# Patient Record
Sex: Female | Born: 1950
Health system: Southern US, Community
[De-identification: ages and names within clinical notes are randomized; demographics above are authoritative.]

## PROBLEM LIST (undated history)

## (undated) DIAGNOSIS — G4733 Obstructive sleep apnea (adult) (pediatric): Secondary | ICD-10-CM

## (undated) DIAGNOSIS — R0600 Dyspnea, unspecified: Secondary | ICD-10-CM

## (undated) DIAGNOSIS — M797 Fibromyalgia: Secondary | ICD-10-CM

## (undated) DIAGNOSIS — G43909 Migraine, unspecified, not intractable, without status migrainosus: Secondary | ICD-10-CM

## (undated) DIAGNOSIS — R42 Dizziness and giddiness: Secondary | ICD-10-CM

## (undated) DIAGNOSIS — M1990 Unspecified osteoarthritis, unspecified site: Secondary | ICD-10-CM

## (undated) DIAGNOSIS — IMO0001 Reserved for inherently not codable concepts without codable children: Secondary | ICD-10-CM

## (undated) DIAGNOSIS — N6320 Unspecified lump in the left breast, unspecified quadrant: Secondary | ICD-10-CM

## (undated) HISTORY — DX: Migraine, unspecified, not intractable, without status migrainosus: G43.909

## (undated) HISTORY — DX: Dizziness and giddiness: R42

## (undated) HISTORY — DX: Dyspnea, unspecified: R06.00

## (undated) HISTORY — PX: WISDOM TOOTH EXTRACTION: SHX21

## (undated) HISTORY — PX: BLEPHAROPLASTY: SUR158

## (undated) HISTORY — PX: BREAST BIOPSY: SHX20

## (undated) HISTORY — DX: Reserved for inherently not codable concepts without codable children: IMO0001

## (undated) HISTORY — DX: Obstructive sleep apnea (adult) (pediatric): G47.33

## (undated) HISTORY — PX: CATARACT EXTRACTION, BILATERAL: SHX1313

## (undated) HISTORY — DX: Unspecified lump in the left breast, unspecified quadrant: N63.20

## (undated) HISTORY — PX: BUNIONECTOMY: SHX129

---

## 1997-12-24 ENCOUNTER — Other Ambulatory Visit: Admission: RE | Admit: 1997-12-24 | Discharge: 1997-12-24 | Payer: Self-pay | Admitting: Obstetrics and Gynecology

## 1999-01-26 ENCOUNTER — Other Ambulatory Visit: Admission: RE | Admit: 1999-01-26 | Discharge: 1999-01-26 | Payer: Self-pay | Admitting: Obstetrics and Gynecology

## 2000-01-06 ENCOUNTER — Encounter: Admission: RE | Admit: 2000-01-06 | Discharge: 2000-01-06 | Payer: Self-pay | Admitting: Internal Medicine

## 2000-01-06 ENCOUNTER — Other Ambulatory Visit: Admission: RE | Admit: 2000-01-06 | Discharge: 2000-01-06 | Payer: Self-pay | Admitting: Obstetrics and Gynecology

## 2000-01-06 ENCOUNTER — Encounter: Payer: Self-pay | Admitting: Internal Medicine

## 2000-02-08 ENCOUNTER — Other Ambulatory Visit: Admission: RE | Admit: 2000-02-08 | Discharge: 2000-02-08 | Payer: Self-pay | Admitting: Obstetrics and Gynecology

## 2000-02-09 ENCOUNTER — Encounter (INDEPENDENT_AMBULATORY_CARE_PROVIDER_SITE_OTHER): Payer: Self-pay

## 2000-02-09 ENCOUNTER — Other Ambulatory Visit: Admission: RE | Admit: 2000-02-09 | Discharge: 2000-02-09 | Payer: Self-pay | Admitting: Obstetrics and Gynecology

## 2000-12-04 ENCOUNTER — Other Ambulatory Visit: Admission: RE | Admit: 2000-12-04 | Discharge: 2000-12-04 | Payer: Self-pay | Admitting: Obstetrics and Gynecology

## 2000-12-18 ENCOUNTER — Encounter: Payer: Self-pay | Admitting: Obstetrics and Gynecology

## 2000-12-18 ENCOUNTER — Encounter: Admission: RE | Admit: 2000-12-18 | Discharge: 2000-12-18 | Payer: Self-pay | Admitting: Obstetrics and Gynecology

## 2001-01-10 ENCOUNTER — Encounter: Payer: Self-pay | Admitting: Internal Medicine

## 2001-01-10 ENCOUNTER — Encounter: Admission: RE | Admit: 2001-01-10 | Discharge: 2001-01-10 | Payer: Self-pay | Admitting: Internal Medicine

## 2002-07-29 ENCOUNTER — Other Ambulatory Visit: Admission: RE | Admit: 2002-07-29 | Discharge: 2002-07-29 | Payer: Self-pay | Admitting: *Deleted

## 2005-07-07 ENCOUNTER — Other Ambulatory Visit: Admission: RE | Admit: 2005-07-07 | Discharge: 2005-07-07 | Payer: Self-pay | Admitting: Internal Medicine

## 2005-07-28 ENCOUNTER — Encounter: Admission: RE | Admit: 2005-07-28 | Discharge: 2005-07-28 | Payer: Self-pay | Admitting: Internal Medicine

## 2006-08-14 ENCOUNTER — Ambulatory Visit: Payer: Self-pay | Admitting: Internal Medicine

## 2006-08-15 ENCOUNTER — Ambulatory Visit (HOSPITAL_BASED_OUTPATIENT_CLINIC_OR_DEPARTMENT_OTHER): Admission: RE | Admit: 2006-08-15 | Discharge: 2006-08-15 | Payer: Self-pay | Admitting: Internal Medicine

## 2006-08-15 ENCOUNTER — Encounter: Payer: Self-pay | Admitting: Internal Medicine

## 2006-08-26 ENCOUNTER — Ambulatory Visit: Payer: Self-pay | Admitting: Internal Medicine

## 2006-09-04 ENCOUNTER — Ambulatory Visit: Payer: Self-pay | Admitting: Internal Medicine

## 2006-09-05 ENCOUNTER — Ambulatory Visit (HOSPITAL_BASED_OUTPATIENT_CLINIC_OR_DEPARTMENT_OTHER): Admission: RE | Admit: 2006-09-05 | Discharge: 2006-09-05 | Payer: Self-pay | Admitting: Internal Medicine

## 2006-09-05 ENCOUNTER — Encounter: Payer: Self-pay | Admitting: Internal Medicine

## 2006-09-12 ENCOUNTER — Ambulatory Visit: Admission: RE | Admit: 2006-09-12 | Discharge: 2006-09-12 | Payer: Self-pay | Admitting: Internal Medicine

## 2006-09-24 ENCOUNTER — Ambulatory Visit: Payer: Self-pay | Admitting: Internal Medicine

## 2006-10-26 ENCOUNTER — Ambulatory Visit: Payer: Self-pay | Admitting: Internal Medicine

## 2006-11-29 ENCOUNTER — Other Ambulatory Visit: Admission: RE | Admit: 2006-11-29 | Discharge: 2006-11-29 | Payer: Self-pay | Admitting: Internal Medicine

## 2006-12-04 ENCOUNTER — Ambulatory Visit: Payer: Self-pay | Admitting: Internal Medicine

## 2006-12-17 ENCOUNTER — Ambulatory Visit: Payer: Self-pay | Admitting: Internal Medicine

## 2006-12-19 ENCOUNTER — Encounter: Admission: RE | Admit: 2006-12-19 | Discharge: 2006-12-19 | Payer: Self-pay | Admitting: Internal Medicine

## 2007-01-31 ENCOUNTER — Ambulatory Visit (HOSPITAL_COMMUNITY): Admission: RE | Admit: 2007-01-31 | Discharge: 2007-01-31 | Payer: Self-pay | Admitting: Anesthesiology

## 2007-02-25 ENCOUNTER — Ambulatory Visit: Payer: Self-pay | Admitting: Internal Medicine

## 2007-06-27 ENCOUNTER — Ambulatory Visit: Payer: Self-pay | Admitting: Internal Medicine

## 2008-03-06 ENCOUNTER — Encounter: Admission: RE | Admit: 2008-03-06 | Discharge: 2008-03-06 | Payer: Self-pay | Admitting: Internal Medicine

## 2008-03-26 ENCOUNTER — Other Ambulatory Visit: Admission: RE | Admit: 2008-03-26 | Discharge: 2008-03-26 | Payer: Self-pay | Admitting: Internal Medicine

## 2008-06-25 ENCOUNTER — Ambulatory Visit: Payer: Self-pay | Admitting: Internal Medicine

## 2008-06-25 DIAGNOSIS — G43909 Migraine, unspecified, not intractable, without status migrainosus: Secondary | ICD-10-CM | POA: Insufficient documentation

## 2008-06-25 DIAGNOSIS — G4733 Obstructive sleep apnea (adult) (pediatric): Secondary | ICD-10-CM | POA: Insufficient documentation

## 2008-06-25 DIAGNOSIS — M797 Fibromyalgia: Secondary | ICD-10-CM | POA: Insufficient documentation

## 2008-06-25 DIAGNOSIS — R0602 Shortness of breath: Secondary | ICD-10-CM | POA: Insufficient documentation

## 2009-04-06 ENCOUNTER — Other Ambulatory Visit: Admission: RE | Admit: 2009-04-06 | Discharge: 2009-04-06 | Payer: Self-pay | Admitting: Internal Medicine

## 2009-04-06 ENCOUNTER — Ambulatory Visit: Payer: Self-pay | Admitting: Internal Medicine

## 2009-04-06 LAB — HM PAP SMEAR

## 2009-06-02 ENCOUNTER — Encounter: Admission: RE | Admit: 2009-06-02 | Discharge: 2009-06-02 | Payer: Self-pay | Admitting: Internal Medicine

## 2009-06-24 ENCOUNTER — Ambulatory Visit: Payer: Self-pay | Admitting: Internal Medicine

## 2009-09-12 ENCOUNTER — Encounter: Payer: Self-pay | Admitting: Internal Medicine

## 2010-04-17 ENCOUNTER — Encounter: Payer: Self-pay | Admitting: Internal Medicine

## 2010-06-07 ENCOUNTER — Ambulatory Visit: Payer: Self-pay | Admitting: Internal Medicine

## 2010-06-08 ENCOUNTER — Encounter: Admission: RE | Admit: 2010-06-08 | Discharge: 2010-06-08 | Payer: Self-pay | Admitting: Internal Medicine

## 2010-06-08 LAB — HM MAMMOGRAPHY

## 2010-06-23 ENCOUNTER — Ambulatory Visit: Payer: Self-pay | Admitting: Internal Medicine

## 2010-06-23 DIAGNOSIS — G4700 Insomnia, unspecified: Secondary | ICD-10-CM | POA: Insufficient documentation

## 2010-09-29 NOTE — Assessment & Plan Note (Signed)
Summary: rov 1 yr ///kp   Primary Makyla Bye/Referring Haidy Kackley:  Eden Emms Baxley  CC:  Yearly follow up visit-sleep; not sleeping as well-goes to bed around 11pm and wakes up around 4am..  History of Present Illness:  06/25/08- CPAP has given big relief from fibromyalgia pain. Also on methadone from Dr Corliss Skains. Some EDS. Takes a nap each afternoon, 45- 60 min. HS 11PM, Up 7-8 AM. Rare Sudafed. Occ palpitation.  June 24, 2009- OSA, hx fibromyalgia Still notes morning tiredness and asks about a trial of higher pressure. CPAP 9 remains comfortable. She doesn't think she is snoring through or particularly restless and she continues to use cpap every night.  Current mask is full-face. Still feels need to rest/ nap in afternoon. Trazodone at HS. Very restless night if omitted. Still on methadone and trazodone for Migraine- Dr Thyra Breed, with dose reduced out of concern of some mild mental lapses. 2 cups of coffee in AM. Needs 3 hours to get going in AM. HS 06-1129, Up 7-730AM.  June 23, 2010- OSA, hx fibromyalgia Nurse-CC: Yearly follow up visit-sleep; not sleeping as well-goes to bed around 11pm and wakes up around 4am. She isn't sleeping as well- bedtime 11PM, sleeps soundly, but now waking around 4AM x 2 months. Fibromyalgia ache and tenderness varies, but worse than 6 months ago. Takes methadone in AM, Trazodone 2x50 mg at bedtime. CPAP 10 is comfortably worn all night every night. Rarely has fallen asleep without it and can tell she didn't sleep as well.      Preventive Screening-Counseling & Management  Alcohol-Tobacco     Smoking Status: quit     Year Quit: 1979     Pack years: 74yrs  Current Medications (verified): 1)  Multivitamins  Tabs (Multiple Vitamin) .... Take 1 Tablet By Mouth Once A Day 2)  Calcium 500/vitamin D 500-125 Mg-Unit Tabs (Calcium Carbonate-Vitamin D) .... Take 2 Tablet By Mouth Once A Day 3)  Methadone Hcl 5 Mg Tabs (Methadone Hcl) .... Take One  Tablet By Mouth Every 8 Hours As Needed 4)  Trazodone Hcl 50 Mg Tabs (Trazodone Hcl) .... Take 1 To 2 Tablet By Mouth At Bedtime 5)  Relpax 40 Mg Tabs (Eletriptan Hydrobromide) .... Take 1 Tablet By Mouth Once A Day As Needed 6)  Cpap 10 .... [facility] 7)  Vitamin D 2000 Unit Tabs (Cholecalciferol) .... Take 1 By Mouth Once Daily 8)  Glucosamine Complex  Tabs (Nutritional Supplements) .Marland Kitchen.. 1 Once Daily 9)  Folic Acid 400 Mcg Tabs (Folic Acid) .Marland Kitchen.. 1 Once Daily 10)  Aleve 220 Mg Tabs (Naproxen Sodium) .... Take As Directed As Needed 11)  Fish Oil 1200 Mg Caps (Omega-3 Fatty Acids) .... Take 1 By Mouth Once Daily  Allergies (verified): 1)  ! Depakote  Past History:  Past Medical History: Last updated: 06/25/2008 DYSPNEA (ICD-786.05) MIGRAINE HEADACHE (ICD-346.90) FIBROMYALGIA (ICD-729.1) OBSTRUCTIVE SLEEP APNEA (ICD-327.23)    Past Surgical History: Last updated: 06/24/2009 Bunion x 2 blepharoplasty breast bx B9  Family History: Last updated: 06/25/2008 mother living - hypertension, arthritis, degeneration, hypercholesterolemia, hypothyroidism, melonoma  father died on multiple myeloma  sibing 1 has arthritis  sibling 2 has sarcoidosis, NIDDM  Social History: Last updated: 06/25/2008 former smoker states walks 3x per week drinks 2 cups caffeine a day drinks wine 1-2x month married for 23yrs 2 children  Risk Factors: Smoking Status: quit (06/23/2010)  Review of Systems      See HPI  The patient denies shortness of breath with  activity, shortness of breath at rest, productive cough, non-productive cough, coughing up blood, chest pain, irregular heartbeats, acid heartburn, indigestion, loss of appetite, weight change, abdominal pain, difficulty swallowing, sore throat, tooth/dental problems, headaches, nasal congestion/difficulty breathing through nose, and sneezing.    Vital Signs:  Patient profile:   60 year old female Height:      66  inches Weight:      181.25 pounds BMI:     29.36 O2 Sat:      95 % on Room air Pulse rate:   71 / minute BP sitting:   118 / 64  (left arm) Cuff size:   regular  Vitals Entered By: Reynaldo Minium CMA (June 23, 2010 1:31 PM)  O2 Flow:  Room air CC: Yearly follow up visit-sleep; not sleeping as well-goes to bed around 11pm and wakes up around 4am.   Physical Exam  Additional Exam:  General: A/Ox3; pleasant and cooperative, NAD, SKIN: no rash, lesions NODES: no lymphadenopathy HEENT: [place]/AT, EOM- WNL, Conjuctivae- clear, PERRLA, TM-WNL, Nose- clear, Throat- clear and wnl, Mallampati  II-III NECK: Supple w/ fair ROM, JVD- none, normal carotid impulses w/o bruits Thyroid- normal to palpation CHEST: Clear to P&A HEART: RRR, no m/g/r heard ABDOMEN: Soft and nl EAV:WUJW, nl pulses, no edema  NEURO: Grossly intact to observation      Impression & Recommendations:  Problem # 1:  OBSTRUCTIVE SLEEP APNEA (ICD-327.23)  Good compliance and control now at 10. No changes needed.  Problem # 2:  INSOMNIA (ICD-780.52)  Early waking insomnia most likely related to the same issues as her fibromyalgia. We discussed sleep hygiene and available management.  since she is already used to trazodone, I will let her try increasing that to 150 mg. If that isn't helpfule, then try a short-half life med on early waking as needed.   Her updated medication list for this problem includes:    Zaleplon 10 Mg Caps (Zaleplon) .Marland Kitchen... 1 for sleep if needed  Medications Added to Medication List This Visit: 1)  Calcium 500/vitamin D 500-125 Mg-unit Tabs (Calcium carbonate-vitamin d) .... Take 2 tablet by mouth once a day 2)  Methadone Hcl 5 Mg Tabs (Methadone hcl) .... Take one tablet by mouth every 8 hours as needed 3)  Trazodone Hcl 50 Mg Tabs (Trazodone hcl) .... Take 1 to 3  tablets by mouth at bedtime 4)  Vitamin D 2000 Unit Tabs (Cholecalciferol) .... Take 1 by mouth once daily 5)  Aleve 220 Mg Tabs  (Naproxen sodium) .... Take as directed as needed 6)  Fish Oil 1200 Mg Caps (Omega-3 fatty acids) .... Take 1 by mouth once daily 7)  Zaleplon 10 Mg Caps (Zaleplon) .Marland Kitchen.. 1 for sleep if needed  Other Orders: Est. Patient Level IV (11914)  Patient Instructions: 1)  Please schedule a follow-up appointment in 1 year. 2)  Make sure your bedroom is comfortable and that it doesn't get too warm during the night- a common cause of early waking. 3)  Try increasing Trazodone to 150 mg at bedtime. 4)  If that doesn't work or you don't like it, then go back to the 100 mg dose and try adding generic Sonata for early waking.  Prescriptions: TRAZODONE HCL 50 MG TABS (TRAZODONE HCL) take 1 to 3  tablets by mouth at bedtime  #90 x 5   Entered and Authorized by:   Waymon Budge MD   Signed by:   Waymon Budge MD on 06/23/2010   Method used:  Print then Give to Patient   RxID:   [id number] ZALEPLON 10 MG CAPS (ZALEPLON) 1 for sleep if needed  #30 x 5   Entered and Authorized by:   Waymon Budge MD   Signed by:   Waymon Budge MD on 06/23/2010   Method used:   Print then Give to Patient   RxID:   [id number]    Immunization History:  Influenza Immunization History:    Influenza:  historical (06/07/2010)

## 2010-09-29 NOTE — Letter (Signed)
Summary: CMN for PAP/[facility]  CMN for PAP/[facility]   Imported By: Sherian Rein 09/15/2009 12:08:10  _____________________________________________________________________  External Attachment:    Type:   Image     Comment:   External Document

## 2010-09-29 NOTE — Letter (Signed)
Summary: CMN/[facility]  CMN/[facility]   Imported By: Sherian Rein 04/25/2010 08:02:30  _____________________________________________________________________  External Attachment:    Type:   Image     Comment:   External Document

## 2011-01-10 NOTE — Assessment & Plan Note (Signed)
[facility]                             PULMONARY OFFICE NOTE   CHEZNEY, HUETHER                       MRN:          [id number]  DATE:06/27/2007                            DOB:          September 04, 1950    PROBLEMS:  1. Obstructive sleep apnea.  2. Fibromyalgia/migraine.  3. Dyspnea.   HISTORY:  She is extremely pleased with the way she is doing now. She is  quite comfortable with CPAP at 9 CWP and says that she can tell the  difference next day if she misses wearing it. Dr. Vear Clock added  Trazodone, which has also been a big help with sleep quality and the  combination seems to be doing nicely. She tried Provigil samples, but it  gave headache and she does not really need it. She is comfortable with  more sense of energy and overall improved quality of life.   MEDICATIONS:  1. Magnesium.  2. Calcium with vitamin D.  3. CPAP 9 CWP.  4. Methadone 5 mg every 6 hours.  5. Trazodone 75 mg nightly.  6. Trova p.r.n. migraine.  7. Relpax 40 mg.  8. Benadryl 25 mg p.r.n.   Drug intolerant DEPAKOTE.   OBJECTIVE:  Weight 209 pounds, blood pressure 110/74, pulse 78, room air  saturation 98%. She is obese, pleasant, and alert. There are no pressure  marks on her face from CPAP. No evident nasal obstruction. Breathing is  unlabored. Pulse is regular without murmur. No strider. No thyromegaly.  No tremor.   IMPRESSION:  Obstructive sleep apnea doing well with CPAP at 9 CWP.  Weight loss would help as reviewed.   PLAN:  Continue present treatment program; aim for weight loss as able.  Schedule return 1 year, earlier p.r.n.     [name], MD, [name], FACP  Electronically Signed    CDY/MedQ  DD: 06/27/2007  DT: 06/28/2007  Job #: [id number]   cc:   Kathryne Hitch, MD  Luanna Cole. Lenord Fellers, M.D.  Kathrin Penner. Vear Clock, M.D.

## 2011-01-10 NOTE — Assessment & Plan Note (Signed)
[facility]                             PULMONARY OFFICE NOTE   Kari Flynn, Kari Flynn                       MRN:          [id number]  DATE:02/25/2007                            DOB:          [birth year]    PROBLEMS:  1. Obstructive sleep apnea.  2. Fibromyalgia/migraine (Dr. Thyra Breed).  3. Dyspnea.   HISTORY:  Treating her sleep apnea continues to seem to help her  headache complaint. Dr. Vear Clock has put her on Methadone and she says  that is the best drug for her fatigue and headache pain. She still  complains of no energy, listless, and has to lay down once or twice a  day to rest. She continues CPAP at 9 CWP without much feedback from her  husband, so she is not sure if she is snoring through it or not. We  reviewed, again, the principles of good sleep hygiene, and expectations  related to CPAP.   MEDICATIONS:  1. Vitamins.  2. Magnesium.  3. Calcium.  4. Methadone 5 mg every 6 hours.  5. Desipramine 25 mg x2.  6. CPAP at 9 CWP.  7. Relpax.  8. Benadryl p.r.n.   Drug intolerant DEPAKOTE.   OBJECTIVE:  Weight 212 pounds, blood pressure 144/94, pulse 91, room air  saturation 97%. She seems alert and comfortable. Overweight. Nose and  throat are clear. Breathing is unlabored. Chest is clear. Heart sounds  are regular without murmur.   IMPRESSION:  Obstructive sleep apnea, fibromyalgia, and complaints of  headache and daytime fatigue. Dyspnea is apparently not significant and  not very well defined as a complaint. I am not sure if she is not just  discussing deconditioning.   PLAN:  1. She is going to ask her husband to pay more attention to her      breathing while asleep with CPAP.  2. Try Provigil sample 200 mg q.a.m.  3. Consider increasing CPAP pressure return or p.r.n.  4. Schedule return 4 months, but earlier p.r.n.     [name], MD, [name], FACP  Electronically Signed    CDY/MedQ  DD: 03/03/2007  DT: 03/04/2007   Job #: [id number]   cc:   Pollyann Savoy, M.D.  Luanna Cole. Lenord Fellers, M.D.

## 2011-01-13 NOTE — Procedures (Signed)
NAMEHARMONEY, SIENKIEWICZ                ACCOUNT NO.:  [id number]   MEDICAL RECORD NO.:  [id number]          PATIENT TYPE:  OUT   LOCATION:  [facility]                 FACILITY:  [facility]   PHYSICIAN:  [name], MD, FCCP, FACPDATE OF BIRTH:  04-21-1949   DATE OF STUDY:  09/05/2006                            NOCTURNAL POLYSOMNOGRAM   REFERRING PHYSICIAN:  [name], MD.   DATE OF STUDY:  September 05, 2006.   INDICATION FOR STUDY:  Hypersomnia with sleep apnea.   EPWORTH SLEEPINESS SCORE:  14/24.   BMI:  1. Weight 209 pounds.   MEDICATIONS:  Was reviewed.   CPAP titration is requested.  A baseline diagnostic NPSG on August 15, 2006 had recorded an AHI of 27.7 per hour.   SLEEP ARCHITECTURE:  Total sleep time 270 minutes with sleep efficiency  71%.  Stage I was 4%, stage II 87%, stages III and IV 10%, REM absent.  Sleep latency 70 minutes, awake after asleep onset 40 minutes.  Arousal  index 14.  No bedtime medication was reported.   RESPIRATORY DATA:  CPAP titration protocol:  CPAP was titrated to 9 CWP,  AHI 11.5.  Higher pressures were associated with higher breakthrough  scores apparently reflecting nasal congestion.  A medium Respironics  comfort II full face mask was used with a heated humidifier.   OXYGEN DATA:  Oxygen saturation held at 97% on room air while on CPAP.   CARDIAC DATA:  Normal sinus rhythm with occasional PVCs.   MOVEMENT-PARASOMNIA:  Rare limb jerks.   IMPRESSIONS-RECOMMENDATIONS:  1. CPAP titration to a best pressure of 9 CWP, apnea/hypopnea index      11.5 per hour.  Higher pressures were associated with increased      breakthrough events reflecting nasal congestion.  A medium      Respironics comfort II full face mask was used with a heated      humidifier.  2. Baseline nocturnal polysomnogram on August 15, 2006 had recorded      an apnea/hypopnea index of 27.7 per hour.  3. The patient commented that her usual morning headache was  not      present suggesting that CPAP had provided relief.      [name], MD, [facility], FACP  Diplomate, Biomedical engineer of Sleep Medicine  Electronically Signed     CDY/MEDQ  D:  09/09/2006 12:45:04  T:  09/09/2006 23:35:05  Job:  [id number]

## 2011-01-13 NOTE — Assessment & Plan Note (Signed)
[facility]                             PULMONARY OFFICE NOTE   ORINE, GOGA                       MRN:          [id number]  DATE:10/26/2006                            DOB:          [birth year]    PULMONARY FOLLOWUP   PROBLEMS:  1. Obstructive sleep apnea.  2. Fibromyalgia/migraine.  3. Dyspnea.   HISTORY:  She is extremely pleased with her experience on CPAP at 9 CWP,  saying she wishes she had had it 10 years ago.  She has had no  headaches.  Has been able to wean off of Cymbalta and methadone.  Temazepam did not seem to have helped sleep quality much.  Benadryl 25  mg at bedtime is satisfactory when needed.  She thought the combination  of Lyrica and Skelaxin increased dyspnea, so she quit the Lyrica.  She  has been less aware of dyspnea and is making an effort to lose some  weight, which I suspect will help with that issue.   MEDICATIONS:  1. Skelaxin 800 mg t.i.d. p.r.n.  2. Vitamin D.  3. Magnesium.  4. Calcium.  5. CPAP at 9 CWP.  6. Occasional Relpax 40 mg.  7. Benadryl.   DRUG INTOLERANCES:  DEPAKOTE.   OBJECTIVE:  Weight 203 pounds, down 6 pounds from late January.  BP  148/68, pulse 112, room air saturation 96%.  She is alert.  Her nasal airway is clear.  There are no pressure marks on her face from  the mask.  Pulse is regular.  Breathing is unlabored.   IMPRESSION:  Obstructive sleep apnea, off to a very good start on  continuous positive airway pressure at 9 cm water pressure.  Dyspnea may  improve with weight loss.  I do not suspect an additional disease at  this time.   PLAN:  She will continue CPAP at 9 CWP.  Walk for endurance and weight  loss.  Return in 4 months.  Earlier p.r.n.     [name], MD, [name], FACP  Electronically Signed    CDY/MedQ  DD: 10/27/2006  DT: 10/27/2006  Job #: [id number]   cc:   Pollyann Savoy, M.D.  Luanna Cole. Lenord Fellers, M.D.

## 2011-01-13 NOTE — Assessment & Plan Note (Signed)
[facility]                             PULMONARY OFFICE NOTE   Kari Flynn, Kari Flynn                       MRN:          [id number]  DATE:09/04/2006                            DOB:          [birth year]    PROBLEM:  1. Obstructive sleep apnea.  2. Fibromyalgia/migraine.   HISTORY:  She returns today in followup of her sleep study done December  19, which confirmed moderately severe obstructive sleep apnea with an  index of 27.7 per hour, nonpositional events, mild to moderate snoring  and of oxygen desaturation to 86%.  Sleep onset and occurrence of events  were too late to permit time for CPAP titration by protocol.  I reviewed  the study with her and we discussed sleep apnea, basics of management,  including weight loss and sleeping off flat of back, as well as her  responsibility to drive safely and we discussed available treatment  options.  She says today, that she has been more aware of exertional  dyspnea, walking perhaps 20 or 30 feet or trying to climb stairs.  She  denies chest pain, cough, wheeze, phlegm or ankle edema and there has  been no sudden event which she thinks is real.   MEDICATION:  1. Cymbalta 60 mg x2 nightly.  2. Lyrica 50 mg t.i.d.  3. Skelaxin 800 mg t.i.d.  4. Vitamins.  5. Lidoderm 5% patch.  6. Ambien 10 mg.  7. Methadone tapering.  8. Relpax 40 mg.   DRUG INTOLERANT:  DEPAKOTE.   OBJECTIVE:  Weight 214 pounds.  Blood pressure 148/78.  Pulse regular  89.  Room air saturation 98%.  She is alert and calm, seeming comfortable.  There is no neck vein distention or strider.  LUNGS:  Fields are clear. Breathing is unlabored.  HEART:  Are regular without murmur.  There is no cyanosis, clubbing, edema or tremor.   IMPRESSION:  1. Moderate obstructive sleep apnea.  This would be sufficient to      explain her complaint of day time fatigue and may impact her other      diagnoses.  2. Complaint of dyspnea,  unsubstantiated.   PLAN:  1. [facility].  2. Schedule pulmonary function test, including 6 minute walk test.  3. Schedule a return to see me after these are completed.     [name], MD, [name], FACP  Electronically Signed    CDY/MedQ  DD: 09/04/2006  DT: 09/05/2006  Job #: [id number]   cc:   Kathryne Hitch, MD  Luanna Cole. Lenord Fellers, M.D.  Clabe Seal. Meryl Crutch, M.D.

## 2011-01-13 NOTE — Procedures (Signed)
NAMEBREN, Kari Flynn                ACCOUNT NO.:  [id number]   MEDICAL RECORD NO.:  [id number]          PATIENT TYPE:  OUT   LOCATION:  SLEEP CENTER                 FACILITY[facility]  [facility]   PHYSICIAN:  [name], MD, FCCP, FACPDATE OF BIRTH:  06[birth year]01/1949   DATE OF STUDY:  08/15/2006                            NOCTURNAL POLYSOMNOGRAM   INDICATIONS FOR PROCEDURE:  Insomnia with sleep apnea.   SLEEPINESS SCORE:  Epward sleepiness score 14/24.   SLEEP ARCHITECTURE:  Total sleep time 383 minutes with sleep efficiency  92%. Stage 1 was 2%; Stage 2 was 95%; Stages 3 and 4 were absent. REM 3%  of total sleep time. Sleep latency 14 minutes. REM latency 277 minutes.  Awake after sleep onset 18 minutes. Arousal index 6.9. The patient took  Ambien at 9:45 p.m.   RESPIRATORY DATA:  Apnea hypopnea index (AHI, RDI) 27.7 obstructive  events per hour, indicating moderate obstructive sleep apnea/hypopnea  syndrome. There were 12 central apnea's, 121 obstructive apnea's, and 44  hypopnea's. Events were not positional, significantly associated  especially with sleep on left side and supine. REM AHI 18 per hour.  Sleep onset was at 11:00 p.m. and the technician reported that the  patient did not repeat criteria for split study protocol on this study  night.   OXYGEN DATA:  Mild to moderate snoring with oxygen desaturation to a  nadir of 86%. Mean oxygen saturation through the study was 94% on room  air.   CARDIAC DATA:  Sinus rhythm normal.   MOVEMENT/PARASOMNIA:  Limb jerks were noted but none associated with  arousal or awakening.   IMPRESSION/RECOMMENDATIONS:  1. Moderate obstructive sleep apnea/hypopnea syndrome, AHI 27.7 per      hour, with non-positional events, mild to moderate snoring and      oxygen desaturation to a nadir of 86%.  2. Consider titration for CPAP trial or evaluate for alternative      therapies as appropriate.      [name], MD, [facility], FACP  Diplomate,  Biomedical engineer of Sleep Medicine  Electronically Signed     CDY/MEDQ  D:  08/26/2006 14:19:41  T:  08/26/2006 21:25:20  Job:  [id number]

## 2011-01-13 NOTE — Assessment & Plan Note (Signed)
[facility]                             PULMONARY OFFICE NOTE   Kari Flynn, Kari Flynn                       MRN:          [id number]  DATE:08/14/2006                            DOB:          October 31, 1950    PULMONARY/SLEEP CONSULTATION:   PROBLEM:  The patient is a 60 year old woman, seeking sleep medicine  evaluation for possible relationship to fibromyalgia, fatigue and  migraines, seen now on kind referral from Dr. Kathryne Hitch.   HISTORY:  She says her history of migraines for the past 16 years, often  present when she wakes, and fibromyalgia, diagnosed with unrelenting  fatigue since 1999, cause her to lie down about three times a day for  naps of 30 minutes to an hour.  Her husband tells her that her legs jerk  in her sleep and occasionally she will yell out.  She is also told that  she snores loudly and stops breathing, such that it sends her husband to  sleep in a different bedroom.  She has been using Ambien 10 mg each  night since November.  With this, she can sleep four or five hours  continuously, but she will still wake three or four times each night.  Before Ambien, sleep was more fragmented.  Bed time is around 11 or  11:30, estimating sleep latency of 15-30 minutes and waking, as noted,  several times each night, at least briefly, before finally getting up  between 8 and 9 a.m.   MEDICATION:  1. Cymbalta 60 mg times two at h.s.  2. Lyrica 50 mg t.i.d.  3. Skelaxin 800 mg t.i.d.  4. Vitamin tablets.  5. Lidoderm 5% patch.  6. Generic Ambien 10 mg.  7. Methadone taper.  8. Magnesium.  9. Calcium.  10.Vitamin D.  11.Use of Relpax p.r.n. 40 mg.   ALLERGIES:  Drug intolerant to DEPAKOTE.   REVIEW OF SYSTEMS:  Restless leg complaints while awake and when lying  in bed, for 25 years.  She says she has felt tired since high school.  I  do not get a history suggestive of cataplexy or sleep paralysis.  Daily  morning nasal  congestion.  She failed Breathe Right strips.  Likes a  warm bedroom because it eases her muscle aches.  Easier exertional  dyspnea climbing steps in the last year or so, but no routine coughing,  wheezing, phlegm, exertional or pleuritic chest pains or palpitation,  ankle edema or rash.  No syncopal episodes or confusion.   PAST HISTORY:  Chronic headaches.  Tubal ligation.  Migraines.  Fibromyalgia.  Mild depression.  Chronic fatigue.  Vaginal delivery  times two.  Bunionectomies times two.  No surgery in the nose or throat.   SOCIAL HISTORY:  Quit smoking in 1978.  One glass of wine once or twice  a month.  She is retired, on disability from work as a Designer, jewellery  as of around 2004.   FAMILY HISTORY:  Mother uses Restoril.  Father is thought to have sleep  apnea, died with multiple myeloma.  Others with heart disease,  hypertension,  myocardial infarction, multiple myeloma.   OBJECTIVE:  Weight 211 pounds, BP 122/70, pulse regular at 77, room air  saturation 95%.  Moderately overweight woman, alert and oriented, calm.  NEUROLOGIC:  Unremarkable to observation.  SKIN:  No evident rash.  ADENOPATHY:  None around the neck, shoulders or axillae.  HEENT:  Tonsils are present, but atrophic.  Palate spacing 3/4.  Visible  pharynx is not reddened.  There is no postnasal drainage.  Her nasal  airway is not obstructed.  There is no thyromegaly or stridor.  No neck  vein distention.  CHEST:  Quiet, clear, unlabored breathing.  Heart sounds are regular,  without murmur or gallop.  ABDOMEN:  Softly obese.  EXTREMITIES:  Without cyanosis, clubbing or edema.  There is no  restlessness or tremor.   IMPRESSION:  1. A number of somatic discomforts, which may fall under the general      category of fibromyalgia.  This disorder has been associated with      abnormally low percentages of stages 3 and 4 sleep in adults, but      this is often absent in normal adults on a  sleep study anyway,  so      the significance is not clear.  2. Significant question of obstructive sleep apnea, given history of      witnessed apneas and loud snoring, associated with daytime fatigue.   PLAN:  We are scheduling a nocturnal polysomnogram with split protocol  and she will return after that for followup.   I do appreciate the chance to meet her.     [name], MD, [name], FACP  Electronically Signed    CDY/MedQ  DD: 08/15/2006  DT: 08/15/2006  Job #: [id number]   cc:   Kathryne Hitch, MD  Clabe Seal. Meryl Crutch, M.D.  Luanna Cole. Lenord Fellers, M.D.  [facility]

## 2011-01-13 NOTE — Assessment & Plan Note (Signed)
[facility]                             PULMONARY OFFICE NOTE   ANNAJULIA, LEWING                       MRN:          [id number]  DATE:09/24/2006                            DOB:          1951/08/09    PROBLEM:  1. Obstructive sleep apnea.  2. Fibromyalgia/migraine.   HISTORY:  Her sleep study in December had documented moderate  obstructive apnea with a index of 27.7 per hour.  She returns now after  CPAP titration.  Ideal control was never achieved.  At a best pressure  of 9 CWP, her index was still 11.5 per hour.  It appeared that as  pressures went up she had increasing nasal congestion as a vasomotor  response.  I reviewed this with her and we are going to let her try home  CPAP at 9 CWP with a heated humidifier and adjust as needed.  We are  going to supplement with Temazepam 15 mg at bedtime if needed to help as  she gets used to the CPAP.  Because of her complaint of dyspnea we had  gotten PFTs on September 12, 2006.  These showed normal spirometry with  insignificant response to bronchodilator.  Measured lung volumes and  diffusion capacity were normal.  I discussed these results with her in  terms of a recommendation that she attempt to lose weight and walk for  endurance.   MEDICATIONS:  1. Cymbalta 60 mg times 2 at bedtime.  2. Lyrica 50 mg t.i.d.  3. Skelaxin 800 mg t.i.d.  4. Vitamins.  5. Lidoderm 5% patch.  6. Methadone.   DRUG INTOLERANCE OF DEPAKOTE.   OBJECTIVE:  Weight 209 pounds, blood pressure 114/72, pulse 98, room air  saturation 97% at rest.  She is overweight, alert.  Breathing is  unlabored.  Heart sounds regular and normal.  No neck vein distension or  strider.  Voice quality normal.  Nasal airway not obstructed.   IMPRESSION:  1. Moderate obstructive sleep apnea with suboptimal initial titration      response to CPAP.  2. Dyspnea with normal pulmonary function testing consistent with      deconditioning.   PLAN:  1. Home CPAP trial at 9 CWP.  2. Supplemental use of Temazepam 15 mg 1 or 2 at bedtime p.r.n. for      sleep while on CPAP.  3. Weight loss.  4. Schedule return 1 month, earlier if p.r.n.     [name], MD, [name], FACP  Electronically Signed    CDY/MedQ  DD: 09/24/2006  DT: 09/25/2006  Job #: [id number]   cc:   Kathryne Hitch, MD  Luanna Cole. Lenord Fellers, M.D.  Clabe Seal. Meryl Crutch, M.D.

## 2011-01-31 ENCOUNTER — Encounter: Payer: Self-pay | Admitting: Internal Medicine

## 2011-01-31 ENCOUNTER — Ambulatory Visit (INDEPENDENT_AMBULATORY_CARE_PROVIDER_SITE_OTHER): Payer: Medicare Other | Admitting: Internal Medicine

## 2011-01-31 DIAGNOSIS — G4700 Insomnia, unspecified: Secondary | ICD-10-CM

## 2011-01-31 DIAGNOSIS — G4733 Obstructive sleep apnea (adult) (pediatric): Secondary | ICD-10-CM

## 2011-01-31 MED ORDER — ARMODAFINIL 150 MG PO TABS: 150.0000 mg | ORAL_TABLET | Freq: Every day | ORAL | Status: DC

## 2011-01-31 NOTE — Progress Notes (Signed)
  Subjective:    Patient ID: Kari Flynn, female    DOB: 1951/05/25, 60 y.o.   MRN: [id number]  HPI 01/31/11- 60 yoF former smoker, followed for OSA, Insomnia, complicated by hx of fibromyalgia and migraine.  Last here June 23, 2010- note reviewed. Continues CPAP 10 w/ trazodone now 3 x 50 mg at bedtime, methadone in the mornings. For a couple of months, sleep has not been restorative and she has felt fatigued. She saw Dr Thyra Breed who got Epworth score 13/24. Says she has trouble with memory and concentration. Remembers past script for provigil, which she never tried.  Sleeps soundly 5-6 hrs, then awake when drugs wear off. Denies moving around much in bed. Aches all over and just as tired when she gets up as when she went to bed. Not told by husband that she is snoring through CPAP. On Disability since 2005- being reassessed. Naps minimally refreshing if she has to take one. 2 cups caf coffee in AM. Throid checked annually.  Review of Systems Constitutional:   No weight loss, night sweats,  Fevers, chills, fatigue, lassitude. HEENT:   No headaches,  Difficulty swallowing,  Tooth/dental problems,  Sore throat,                No sneezing, itching, ear ache, nasal congestion, post nasal drip,   CV:  No chest pain,  Orthopnea, PND, swelling in lower extremities, anasarca, dizziness, palpitations  GI  No heartburn, indigestion, abdominal pain, nausea, vomiting, diarrhea, change in bowel habits, loss of appetite  Resp: No shortness of breath with exertion or at rest.  No excess mucus, no productive cough,  No non-productive cough,  No coughing up of blood.  No change in color of mucus.  No wheezing.   Skin: no rash or lesions.  GU: no dysuria, change in color of urine, no urgency or frequency.  No flank pain.  MS:  Has to sit and rest several hours of each day. Marland Kitchen  Psych:  No change in mood or affect. No depression or anxiety.  No memory loss.      Objective:   Physical  Exam General- Alert, Oriented, Affect-appropriate, Distress- none acute  WDWN   VSX obtained but not yet entered by nurse  Skin- rash-none, lesions- none, excoriation- none  Lymphadenopathy- none  Head- atraumatic  Eyes- Gross vision intact, PERRLA, conjunctivae clear, secretions  Mild periorbital edema Ears- Hearing, canals, Tm- normal   Nose- Clear, with mild turbinate prominence, No- Septal dev, mucus, polyps, erosion, perforation   Throat- Mallampati III , mucosa clear , drainage- none, tonsils- atrophic  Neck- flexible , trachea midline, no stridor , thyroid nl, carotid no bruit  Chest - symmetrical excursion , unlabored     Heart/CV- RRR , no murmur , no gallop  , no rub, nl s1 s2                     - JVD- none , edema- none, stasis changes- none, varices- none     Lung- clear to P&A, wheeze- none, cough- none , dullness-none, rub- none     Chest wall-  Abd- tender-no, distended-no, bowel sounds-present, HSM- no  Br/ Gen/ Rectal- Not done, not indicated  Extrem- cyanosis- none, clubbing, none, atrophy- none, strength- nl  Neuro- grossly intact to observation         Assessment & Plan:

## 2011-01-31 NOTE — Assessment & Plan Note (Addendum)
She is only sleeping 5-6 hurs after 150 mg trazodone, so we may be able to change to something longer acting like clonazepam, depending on why trazodone is being prescribed. I won't change this till Dr Vear Clock can weigh- in. A little more sleep may help her complaints of nonrestorative sleep and impaired concentration. Naps are ok.  We will try samples of Nuvigil

## 2011-01-31 NOTE — Assessment & Plan Note (Addendum)
Very compliant with CPAP at 10. Need to determine if nonrestorative sleep can be improved by CPAP change.

## 2011-01-31 NOTE — Patient Instructions (Signed)
Try 1/2- 1 Nuvigil 150 mg tab in the morning as needed for alertness    Samples and script  Continue CPAP  We can consider with Dr Vear Clock whether to try something other than trazodone, seeking a longer night time sleep.

## 2011-02-05 ENCOUNTER — Encounter: Payer: Self-pay | Admitting: Internal Medicine

## 2011-03-24 ENCOUNTER — Telehealth: Payer: Self-pay | Admitting: Internal Medicine

## 2011-03-24 NOTE — Telephone Encounter (Signed)
Form received and placed on CDY cart for reivew and signature. Will forward msg to Florentina Addison so she may follow-up.

## 2011-03-24 NOTE — Telephone Encounter (Signed)
Nuvigil PA initiated through [employer] at [phone]. Awaiting fax.

## 2011-03-24 NOTE — Telephone Encounter (Signed)
Form completed and faxed back to insurance. Form placed in triage awaiting response.

## 2011-04-05 ENCOUNTER — Encounter: Payer: Self-pay | Admitting: *Deleted

## 2011-04-05 NOTE — Telephone Encounter (Signed)
Received another PA request from [facility].  Called [facility] [facility] to check on the status of this -- spoke with Chava who states the nuvigil was denied on Aug 4.  She will refax a denial letter to triage.  States they are wanting to know if pt has tried Provigil but this will also require a PA.  Will forward message to CDY to address.

## 2011-04-05 NOTE — Telephone Encounter (Signed)
Created in error

## 2011-04-06 NOTE — Telephone Encounter (Signed)
This information has been faxed back to [employer] and we are waiting on approval/denial. Information is in Triage waiting area.

## 2011-04-10 NOTE — Telephone Encounter (Signed)
CY, this has been denied.Please advise. I have attached the denial from insurance company to this message on your cart. Thanks.

## 2011-04-11 NOTE — Telephone Encounter (Signed)
Kari Flynn is denied by her insurance.  Offer script for Provigil 200 mg, once daily. This will also require prior auth, but it allows a clean start with insurance and should work as well.   # 30, 1 daily as needed  No refill.

## 2011-04-12 MED ORDER — MODAFINIL 200 MG PO TABS: 200.0000 mg | ORAL_TABLET | Freq: Every day | ORAL | Status: DC | PRN

## 2011-04-12 NOTE — Telephone Encounter (Signed)
lmomtcb x1 

## 2011-04-12 NOTE — Telephone Encounter (Signed)
Pt advised and ok with rx called in. Carron Curie, CMA

## 2011-04-27 ENCOUNTER — Telehealth: Payer: Self-pay | Admitting: *Deleted

## 2011-04-27 NOTE — Telephone Encounter (Signed)
Pt ID # [id number]. Initiated PA for generic provigil. Med approved through 07-28-11. Pt pharmacy [facility] at [phone] is aware. Carron Curie, CMA

## 2011-06-07 ENCOUNTER — Encounter: Payer: Self-pay | Admitting: Internal Medicine

## 2011-06-09 ENCOUNTER — Ambulatory Visit (INDEPENDENT_AMBULATORY_CARE_PROVIDER_SITE_OTHER): Payer: Medicare Other | Admitting: Internal Medicine

## 2011-06-09 ENCOUNTER — Encounter: Payer: Self-pay | Admitting: Internal Medicine

## 2011-06-09 DIAGNOSIS — E559 Vitamin D deficiency, unspecified: Secondary | ICD-10-CM

## 2011-06-09 DIAGNOSIS — F3289 Other specified depressive episodes: Secondary | ICD-10-CM

## 2011-06-09 DIAGNOSIS — F1910 Other psychoactive substance abuse, uncomplicated: Secondary | ICD-10-CM

## 2011-06-09 DIAGNOSIS — G4730 Sleep apnea, unspecified: Secondary | ICD-10-CM

## 2011-06-09 DIAGNOSIS — R5381 Other malaise: Secondary | ICD-10-CM

## 2011-06-09 DIAGNOSIS — Z136 Encounter for screening for cardiovascular disorders: Secondary | ICD-10-CM

## 2011-06-09 DIAGNOSIS — R5383 Other fatigue: Secondary | ICD-10-CM

## 2011-06-09 DIAGNOSIS — F32A Depression, unspecified: Secondary | ICD-10-CM

## 2011-06-09 DIAGNOSIS — Z79899 Other long term (current) drug therapy: Secondary | ICD-10-CM

## 2011-06-09 DIAGNOSIS — Z13220 Encounter for screening for lipoid disorders: Secondary | ICD-10-CM

## 2011-06-09 DIAGNOSIS — Z0000 Encounter for general adult medical examination without abnormal findings: Secondary | ICD-10-CM

## 2011-06-09 DIAGNOSIS — M1990 Unspecified osteoarthritis, unspecified site: Secondary | ICD-10-CM

## 2011-06-09 DIAGNOSIS — M797 Fibromyalgia: Secondary | ICD-10-CM

## 2011-06-09 DIAGNOSIS — Z23 Encounter for immunization: Secondary | ICD-10-CM

## 2011-06-09 DIAGNOSIS — G43909 Migraine, unspecified, not intractable, without status migrainosus: Secondary | ICD-10-CM

## 2011-06-09 DIAGNOSIS — F329 Major depressive disorder, single episode, unspecified: Secondary | ICD-10-CM

## 2011-06-09 DIAGNOSIS — IMO0001 Reserved for inherently not codable concepts without codable children: Secondary | ICD-10-CM

## 2011-06-09 LAB — CBC WITH DIFFERENTIAL/PLATELET
Basophils Absolute: 0 10*3/uL (ref 0.0–0.1)
Eosinophils Absolute: 0.1 10*3/uL (ref 0.0–0.7)
Eosinophils Relative: 2 % (ref 0–5)
Lymphocytes Relative: 33 % (ref 12–46)
Lymphs Abs: 1.2 10*3/uL (ref 0.7–4.0)
MCV: 92.7 fL (ref 78.0–100.0)
Neutrophils Relative %: 52 % (ref 43–77)
Platelets: 228 10*3/uL (ref 150–400)
RBC: 4.63 MIL/uL (ref 3.87–5.11)
RDW: 13.2 % (ref 11.5–15.5)
WBC: 3.6 10*3/uL — ABNORMAL LOW (ref 4.0–10.5)

## 2011-06-09 LAB — COMPREHENSIVE METABOLIC PANEL
ALT: 14 U/L (ref 0–35)
AST: 15 U/L (ref 0–37)
Chloride: 105 mEq/L (ref 96–112)
Creat: 0.73 mg/dL (ref 0.50–1.10)
Sodium: 142 mEq/L (ref 135–145)
Total Bilirubin: 0.8 mg/dL (ref 0.3–1.2)
Total Protein: 6.5 g/dL (ref 6.0–8.3)

## 2011-06-09 LAB — LIPID PANEL
Total CHOL/HDL Ratio: 2.6 Ratio
VLDL: 17 mg/dL (ref 0–40)

## 2011-06-09 LAB — POCT URINALYSIS DIPSTICK
Blood, UA: NEGATIVE
Nitrite, UA: NEGATIVE
Protein, UA: NEGATIVE
Urobilinogen, UA: NEGATIVE
pH, UA: 7.5

## 2011-06-09 LAB — TSH: TSH: 2.243 u[IU]/mL (ref 0.350–4.500)

## 2011-06-09 MED ORDER — TETANUS-DIPHTH-ACELL PERTUSSIS 5-2.5-18.5 LF-MCG/0.5 IM SUSP
0.5000 mL | Freq: Once | INTRAMUSCULAR | Status: AC
  Administered 2011-06-09: 0.5 mL via INTRAMUSCULAR

## 2011-06-10 LAB — VITAMIN D 25 HYDROXY (VIT D DEFICIENCY, FRACTURES): Vit D, 25-Hydroxy: 45 ng/mL (ref 30–89)

## 2011-06-13 ENCOUNTER — Other Ambulatory Visit: Payer: Self-pay | Admitting: Internal Medicine

## 2011-06-13 DIAGNOSIS — Z1231 Encounter for screening mammogram for malignant neoplasm of breast: Secondary | ICD-10-CM

## 2011-06-21 ENCOUNTER — Telehealth: Payer: Self-pay | Admitting: Internal Medicine

## 2011-06-21 MED ORDER — MODAFINIL 200 MG PO TABS: 200.0000 mg | ORAL_TABLET | Freq: Every day | ORAL | Status: DC | PRN

## 2011-06-21 NOTE — Telephone Encounter (Signed)
Ok to refill till next ov 

## 2011-06-21 NOTE — Telephone Encounter (Signed)
Last OV 01/31/2011. Pending Ov 08/03/2011. Provigil 200 mg, #30, 1 by mouth daily as needed, last filled on 04/28/2011. Please advise on sending refills.

## 2011-06-21 NOTE — Telephone Encounter (Signed)
rx sent to pharmacy

## 2011-06-22 ENCOUNTER — Ambulatory Visit: Payer: Self-pay | Admitting: Internal Medicine

## 2011-06-22 ENCOUNTER — Telehealth: Payer: Self-pay | Admitting: Internal Medicine

## 2011-06-22 NOTE — Telephone Encounter (Signed)
Pt's disability claim is up for yearly review. Spoke with Rheumatologist who saw her for disability evaluation last week. Had call today from Mr. Asrani about her claim. Returned his call at 5:10 pm and there was no answer- so left voice mail that call had been returned.

## 2011-06-25 NOTE — Patient Instructions (Signed)
Continue to take medications as previously prescribed by Dr. Vear Clock and rheumatologist. Continue trazodone for sleep and Relpax as needed for migraine headache. Return in one year.

## 2011-06-25 NOTE — Progress Notes (Signed)
Subjective:    Patient ID: Kari Flynn, female    DOB: Apr 03, 1951, 60 y.o.   MRN: [id number]  HPI and 60 year old female seen today for health maintenance and evaluation of medical problems. She developed fibromyalgia 1999 and became disabling 2004. Long-standing history of migraine headaches. Has been seen by Dr. Thyra Breed, pain management physician and Dr. Corliss Skains, rheumatologist. Patient is intolerant of Depakote as it has caused hair loss and diarrhea. History of sleep apnea and osteoarthritis of the knees. Uses a CPAP machine. Had colonoscopy by Dr. Juanda Chance in 2008.  Had D. and C. 1979, bilateral tubal ligation 1987, bunionectomy 1990, right breast biopsy (benign) February 1995.  Patient has chronic fatigue and lack of energy. Frequent headaches and fibromyalgia issues. Dr. Rosalva Ferron saw her for knee pain March 2012. Was diagnosed with bilateral knee osteoarthritis and had bilateral knee steroid injections. Also was found to have positive trigger points  with ongoing pain. Rheumatologist advised her to reapply for disability. 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 The headaches and fibromyalgia simply became intolerable. She was out of work a lot and was stressed out much of the time. I do not see at this point that she is able to return to work because she has had continued problems now for years.  Family history mother with history of hypertension and gout. Father died with multiple myeloma. 2 brothers in good health. Patient is married and has adult sons.    Review of Systems patient has constipation, memory loss, fatigue, depression, headaches, decreased strength in extremities. Does not smoke. Does not drink alcohol. Tries to walk 2 or 3 times a week for exercise. Has trouble with stairs. Has morning stiffness. Has had bilateral trochanteric bursitis as recently as February 2012 according to rheumatology records. Chronic insomnia despite taking trazodone. Is taking vitamin  supplements. Takes methadone from pain management physician. In September 2011 had left trochanteric bursitis and bursa was injected by Dr. Nicki Reaper with Kenalog and lidocaine     Objective:   Physical Exam  Vitals reviewed. Constitutional: She is oriented to person, place, and time. She appears well-nourished. No distress.  HENT:  Head: Normocephalic and atraumatic.  Right Ear: External ear normal.  Left Ear: External ear normal.  Mouth/Throat: Oropharynx is clear and moist.  Eyes: Conjunctivae and EOM are normal. Pupils are equal, round, and reactive to light. Right eye exhibits no discharge. Left eye exhibits no discharge. No scleral icterus.  Neck: Neck supple. No JVD present. No thyromegaly present.  Cardiovascular: Normal rate, regular rhythm, normal heart sounds and intact distal pulses.   Pulmonary/Chest: Effort normal and breath sounds normal. She has no wheezes. She has no rales.  Abdominal: Soft. Bowel sounds are normal. She exhibits no mass. There is no tenderness.  Genitourinary:       Deferred to GYN physician  Musculoskeletal: She exhibits no edema.       Changes of osteoarthritis in her hands i.e. Heberden's and Bouchard's nodes  Lymphadenopathy:    She has no cervical adenopathy.  Neurological: She is alert and oriented to person, place, and time. She has normal reflexes. No cranial nerve deficit. Coordination normal.  Skin: Skin is warm and dry. She is not diaphoretic.  Psychiatric: Her behavior is normal. Judgment and thought content normal.          Assessment & Plan:  Migraine headaches  Fibromyalgia syndrome  Osteoarthritis of knees and hands  Depression  Fatigue  Sleep apnea  Plan: It is  my recommendation the patient continued to be considered fully disabled. I do not see her returning to work anytime in the near future due to these multiple medical issues. She specifically does not have the stamina to do either part-time her full-time work. Working  would aggravate her migraine headaches, cause her significant stress and could potentially worsen her fibromyalgia symptoms.

## 2011-06-29 ENCOUNTER — Ambulatory Visit
Admission: RE | Admit: 2011-06-29 | Discharge: 2011-06-29 | Disposition: A | Discharge status: Home / Self Care | Payer: Medicare Other | Source: Ambulatory Visit | Attending: Internal Medicine | Admitting: Internal Medicine

## 2011-06-29 DIAGNOSIS — Z1231 Encounter for screening mammogram for malignant neoplasm of breast: Secondary | ICD-10-CM

## 2011-08-03 ENCOUNTER — Ambulatory Visit (INDEPENDENT_AMBULATORY_CARE_PROVIDER_SITE_OTHER): Payer: Medicare Other | Admitting: Internal Medicine

## 2011-08-03 ENCOUNTER — Encounter: Payer: Self-pay | Admitting: Internal Medicine

## 2011-08-03 VITALS — BP 124/80 | HR 72 | Ht 66.0 in | Wt 177.4 lb

## 2011-08-03 DIAGNOSIS — G4700 Insomnia, unspecified: Secondary | ICD-10-CM

## 2011-08-03 DIAGNOSIS — G4733 Obstructive sleep apnea (adult) (pediatric): Secondary | ICD-10-CM

## 2011-08-03 NOTE — Progress Notes (Signed)
Patient ID: Kari Flynn, female    DOB: 10/31/1950, 60 y.o.   MRN: [id number]  HPI 01/31/11- 61 yoF former smoker, followed for OSA, Insomnia, complicated by hx of fibromyalgia and migraine.  Last here June 23, 2010- note reviewed. Continues CPAP 10 w/ trazodone now 3 x 50 mg at bedtime, methadone in the mornings. For a couple of months, sleep has not been restorative and she has felt fatigued. She saw Dr Thyra Breed who got Epworth score 13/24. Says she has trouble with memory and concentration. Remembers past script for provigil, which she never tried.  Sleeps soundly 5-6 hrs, then awake when drugs wear off. Denies moving around much in bed. Aches all over and just as tired when she gets up as when she went to bed. Not told by husband that she is snoring through CPAP. On Disability since 2005- being reassessed. Naps minimally refreshing if she has to take one. 2 cups caf coffee in AM. Thyroid checked annually.   08/03/11-  59 yoF former smoker, followed for OSA, Insomnia, complicated by hx of fibromyalgia and migraine.  She continues to use CPAP all night every night at 10/[facility]. Much less need for afternoon naps. Provigil is being taken for 5 days of each week. By staying awake in the daytime she is now able to sleep better at night. She is satisfied with her care. Still taking trazodone for sleep and methadone 5 mg 3 times a day. We have discussed sedating potential of methadone.  Review of Systems-see HPI Constitutional:   No-   weight loss, night sweats, fevers, chills, fatigue, lassitude. HEENT:   No-  headaches, difficulty swallowing, tooth/dental problems, sore throat,       No-  sneezing, itching, ear ache, nasal congestion, post nasal drip,  CV:  No-   chest pain, orthopnea, PND, swelling in lower extremities, anasarca,                                  dizziness, palpitations Resp: No-   shortness of breath with exertion or at rest.              No-   productive  cough,  No non-productive cough,  No- coughing up of blood.              No-   change in color of mucus.  No- wheezing.   Skin: . GI:  GU: MS:   Neuro-     nothing unusual Psych:  No- change in mood or affect. No depression or anxiety.  No memory loss.       Objective:   Physical Exam General- Alert, Oriented, Affect-,, Distress- none acute; medium build Skin- rash-none, lesions- none, excoriation- none Lymphadenopathy- none Head- atraumatic            Eyes- Gross vision intact, PERRLA, conjunctivae clear secretions; periorbital edema            Ears- Hearing, canals-normal            Nose- Clear, no-Septal dev, mucus, polyps, erosion, perforation             Throat- Mallampati III-IV , mucosa clear , drainage- none, tonsils- atrophic Neck- flexible , trachea midline, no stridor , thyroid nl, carotid no bruit Chest - symmetrical excursion , unlabored           Heart/CV- RRR , no murmur , no  gallop  , no rub, nl s1 s2                           - JVD- none , edema- none, stasis changes- none, varices- none           Lung- clear to P&A, wheeze- none, cough- none , dullness-none, rub- none           Chest wall-  Abd- tender-no, distended-no, bowel sounds-present, HSM- no Br/ Gen/ Rectal- Not done, not indicated Extrem- cyanosis- none, clubbing, none, atrophy- none, strength- nl Neuro- grossly intact to observation

## 2011-08-03 NOTE — Patient Instructions (Signed)
Continue CPAP at 10  Please call for refills as needed

## 2011-08-06 NOTE — Assessment & Plan Note (Signed)
Better control of her sleep wake cycle with good compliance and control using CPAP.

## 2011-08-06 NOTE — Assessment & Plan Note (Signed)
By helping deal with daytime hypersomnia residual from her sleep apnea, she is now sleeping better at night.

## 2011-08-11 ENCOUNTER — Other Ambulatory Visit: Payer: Self-pay | Admitting: Allergy

## 2011-08-11 MED ORDER — MODAFINIL 200 MG PO TABS: 200.0000 mg | ORAL_TABLET | Freq: Every day | ORAL | Status: DC | PRN

## 2011-08-11 NOTE — Telephone Encounter (Signed)
CY please advise if ok to refill Modafinil 200 mg tablet #30  Last fill was on 06/21/2011. Please advise

## 2011-08-11 NOTE — Telephone Encounter (Signed)
Per CY ok to fill modafinil 200 mg #30 with 5 refills

## 2011-08-25 ENCOUNTER — Telehealth: Payer: Self-pay | Admitting: Internal Medicine

## 2011-08-25 NOTE — Telephone Encounter (Signed)
Member ID # [id number]. [id number]. APPROVED through 08/24/12. Patient and pharmacy notified.

## 2011-08-30 DIAGNOSIS — M47817 Spondylosis without myelopathy or radiculopathy, lumbosacral region: Secondary | ICD-10-CM | POA: Diagnosis not present

## 2011-08-30 DIAGNOSIS — IMO0001 Reserved for inherently not codable concepts without codable children: Secondary | ICD-10-CM | POA: Diagnosis not present

## 2011-08-30 DIAGNOSIS — M47812 Spondylosis without myelopathy or radiculopathy, cervical region: Secondary | ICD-10-CM | POA: Diagnosis not present

## 2011-09-27 DIAGNOSIS — G43809 Other migraine, not intractable, without status migrainosus: Secondary | ICD-10-CM | POA: Diagnosis not present

## 2011-09-27 DIAGNOSIS — IMO0001 Reserved for inherently not codable concepts without codable children: Secondary | ICD-10-CM | POA: Diagnosis not present

## 2011-09-27 DIAGNOSIS — M47812 Spondylosis without myelopathy or radiculopathy, cervical region: Secondary | ICD-10-CM | POA: Diagnosis not present

## 2011-10-25 DIAGNOSIS — IMO0001 Reserved for inherently not codable concepts without codable children: Secondary | ICD-10-CM | POA: Diagnosis not present

## 2011-10-25 DIAGNOSIS — G43809 Other migraine, not intractable, without status migrainosus: Secondary | ICD-10-CM | POA: Diagnosis not present

## 2011-10-25 DIAGNOSIS — M47812 Spondylosis without myelopathy or radiculopathy, cervical region: Secondary | ICD-10-CM | POA: Diagnosis not present

## 2011-11-06 DIAGNOSIS — IMO0001 Reserved for inherently not codable concepts without codable children: Secondary | ICD-10-CM | POA: Diagnosis not present

## 2011-11-06 DIAGNOSIS — M1710 Unilateral primary osteoarthritis, unspecified knee: Secondary | ICD-10-CM | POA: Diagnosis not present

## 2011-11-06 DIAGNOSIS — M25549 Pain in joints of unspecified hand: Secondary | ICD-10-CM | POA: Diagnosis not present

## 2011-11-06 DIAGNOSIS — R5383 Other fatigue: Secondary | ICD-10-CM | POA: Diagnosis not present

## 2011-11-13 DIAGNOSIS — M1710 Unilateral primary osteoarthritis, unspecified knee: Secondary | ICD-10-CM | POA: Diagnosis not present

## 2011-11-20 DIAGNOSIS — M1710 Unilateral primary osteoarthritis, unspecified knee: Secondary | ICD-10-CM | POA: Diagnosis not present

## 2011-11-21 DIAGNOSIS — M1710 Unilateral primary osteoarthritis, unspecified knee: Secondary | ICD-10-CM | POA: Diagnosis not present

## 2011-11-21 DIAGNOSIS — M47812 Spondylosis without myelopathy or radiculopathy, cervical region: Secondary | ICD-10-CM | POA: Diagnosis not present

## 2011-11-21 DIAGNOSIS — IMO0001 Reserved for inherently not codable concepts without codable children: Secondary | ICD-10-CM | POA: Diagnosis not present

## 2011-11-21 DIAGNOSIS — G43809 Other migraine, not intractable, without status migrainosus: Secondary | ICD-10-CM | POA: Diagnosis not present

## 2011-12-04 DIAGNOSIS — H04129 Dry eye syndrome of unspecified lacrimal gland: Secondary | ICD-10-CM | POA: Diagnosis not present

## 2011-12-04 DIAGNOSIS — H52209 Unspecified astigmatism, unspecified eye: Secondary | ICD-10-CM | POA: Diagnosis not present

## 2011-12-04 DIAGNOSIS — D3130 Benign neoplasm of unspecified choroid: Secondary | ICD-10-CM | POA: Diagnosis not present

## 2011-12-04 DIAGNOSIS — H2510 Age-related nuclear cataract, unspecified eye: Secondary | ICD-10-CM | POA: Diagnosis not present

## 2011-12-05 DIAGNOSIS — D485 Neoplasm of uncertain behavior of skin: Secondary | ICD-10-CM | POA: Diagnosis not present

## 2011-12-05 DIAGNOSIS — D239 Other benign neoplasm of skin, unspecified: Secondary | ICD-10-CM | POA: Diagnosis not present

## 2011-12-20 DIAGNOSIS — M47817 Spondylosis without myelopathy or radiculopathy, lumbosacral region: Secondary | ICD-10-CM | POA: Diagnosis not present

## 2011-12-20 DIAGNOSIS — G43809 Other migraine, not intractable, without status migrainosus: Secondary | ICD-10-CM | POA: Diagnosis not present

## 2011-12-20 DIAGNOSIS — IMO0001 Reserved for inherently not codable concepts without codable children: Secondary | ICD-10-CM | POA: Diagnosis not present

## 2012-01-17 DIAGNOSIS — M47812 Spondylosis without myelopathy or radiculopathy, cervical region: Secondary | ICD-10-CM | POA: Diagnosis not present

## 2012-01-17 DIAGNOSIS — G43809 Other migraine, not intractable, without status migrainosus: Secondary | ICD-10-CM | POA: Diagnosis not present

## 2012-02-14 DIAGNOSIS — M47812 Spondylosis without myelopathy or radiculopathy, cervical region: Secondary | ICD-10-CM | POA: Diagnosis not present

## 2012-02-14 DIAGNOSIS — G5620 Lesion of ulnar nerve, unspecified upper limb: Secondary | ICD-10-CM | POA: Diagnosis not present

## 2012-02-14 DIAGNOSIS — M1710 Unilateral primary osteoarthritis, unspecified knee: Secondary | ICD-10-CM | POA: Diagnosis not present

## 2012-02-14 DIAGNOSIS — G43809 Other migraine, not intractable, without status migrainosus: Secondary | ICD-10-CM | POA: Diagnosis not present

## 2012-03-13 ENCOUNTER — Telehealth: Payer: Self-pay | Admitting: Internal Medicine

## 2012-03-13 DIAGNOSIS — G4733 Obstructive sleep apnea (adult) (pediatric): Secondary | ICD-10-CM | POA: Diagnosis not present

## 2012-03-13 DIAGNOSIS — G43809 Other migraine, not intractable, without status migrainosus: Secondary | ICD-10-CM | POA: Diagnosis not present

## 2012-03-13 DIAGNOSIS — M47812 Spondylosis without myelopathy or radiculopathy, cervical region: Secondary | ICD-10-CM | POA: Diagnosis not present

## 2012-03-13 DIAGNOSIS — M47817 Spondylosis without myelopathy or radiculopathy, lumbosacral region: Secondary | ICD-10-CM | POA: Diagnosis not present

## 2012-03-13 NOTE — Telephone Encounter (Signed)
[facility] REQUESTING MODAFINIL 200 MG <> TAKE 1 TABLET PO QD PRN. #30  X5 Last fill 01-24-12 Allergies  Allergen Reactions  . Divalproex Sodium    Dr Maple Hudson is this ok to fill Thank you

## 2012-03-14 MED ORDER — MODAFINIL 200 MG PO TABS: 200.0000 mg | ORAL_TABLET | Freq: Every day | ORAL | Status: DC | PRN

## 2012-03-14 NOTE — Telephone Encounter (Signed)
Ok to refill 

## 2012-03-14 NOTE — Telephone Encounter (Signed)
Called refill to pharmacy

## 2012-04-10 DIAGNOSIS — G43809 Other migraine, not intractable, without status migrainosus: Secondary | ICD-10-CM | POA: Diagnosis not present

## 2012-04-10 DIAGNOSIS — M47812 Spondylosis without myelopathy or radiculopathy, cervical region: Secondary | ICD-10-CM | POA: Diagnosis not present

## 2012-04-10 DIAGNOSIS — M47817 Spondylosis without myelopathy or radiculopathy, lumbosacral region: Secondary | ICD-10-CM | POA: Diagnosis not present

## 2012-05-08 DIAGNOSIS — M47812 Spondylosis without myelopathy or radiculopathy, cervical region: Secondary | ICD-10-CM | POA: Diagnosis not present

## 2012-05-08 DIAGNOSIS — G43809 Other migraine, not intractable, without status migrainosus: Secondary | ICD-10-CM | POA: Diagnosis not present

## 2012-05-08 DIAGNOSIS — M47817 Spondylosis without myelopathy or radiculopathy, lumbosacral region: Secondary | ICD-10-CM | POA: Diagnosis not present

## 2012-05-27 DIAGNOSIS — M1710 Unilateral primary osteoarthritis, unspecified knee: Secondary | ICD-10-CM | POA: Diagnosis not present

## 2012-06-03 DIAGNOSIS — M1710 Unilateral primary osteoarthritis, unspecified knee: Secondary | ICD-10-CM | POA: Diagnosis not present

## 2012-06-05 DIAGNOSIS — G43809 Other migraine, not intractable, without status migrainosus: Secondary | ICD-10-CM | POA: Diagnosis not present

## 2012-06-05 DIAGNOSIS — M47812 Spondylosis without myelopathy or radiculopathy, cervical region: Secondary | ICD-10-CM | POA: Diagnosis not present

## 2012-06-05 DIAGNOSIS — M1710 Unilateral primary osteoarthritis, unspecified knee: Secondary | ICD-10-CM | POA: Diagnosis not present

## 2012-06-05 DIAGNOSIS — M47817 Spondylosis without myelopathy or radiculopathy, lumbosacral region: Secondary | ICD-10-CM | POA: Diagnosis not present

## 2012-06-07 ENCOUNTER — Other Ambulatory Visit: Payer: Self-pay | Admitting: Internal Medicine

## 2012-06-07 DIAGNOSIS — Z1231 Encounter for screening mammogram for malignant neoplasm of breast: Secondary | ICD-10-CM

## 2012-06-10 DIAGNOSIS — M1710 Unilateral primary osteoarthritis, unspecified knee: Secondary | ICD-10-CM | POA: Diagnosis not present

## 2012-07-03 DIAGNOSIS — M47812 Spondylosis without myelopathy or radiculopathy, cervical region: Secondary | ICD-10-CM | POA: Diagnosis not present

## 2012-07-03 DIAGNOSIS — IMO0001 Reserved for inherently not codable concepts without codable children: Secondary | ICD-10-CM | POA: Diagnosis not present

## 2012-07-03 DIAGNOSIS — G43809 Other migraine, not intractable, without status migrainosus: Secondary | ICD-10-CM | POA: Diagnosis not present

## 2012-07-04 ENCOUNTER — Ambulatory Visit: Payer: Medicare Other

## 2012-07-09 ENCOUNTER — Ambulatory Visit
Admission: RE | Admit: 2012-07-09 | Discharge: 2012-07-09 | Disposition: A | Discharge status: Home / Self Care | Payer: Medicare Other | Source: Ambulatory Visit | Attending: Internal Medicine | Admitting: Internal Medicine

## 2012-07-09 DIAGNOSIS — Z1231 Encounter for screening mammogram for malignant neoplasm of breast: Secondary | ICD-10-CM | POA: Diagnosis not present

## 2012-07-31 DIAGNOSIS — M47817 Spondylosis without myelopathy or radiculopathy, lumbosacral region: Secondary | ICD-10-CM | POA: Diagnosis not present

## 2012-07-31 DIAGNOSIS — M47812 Spondylosis without myelopathy or radiculopathy, cervical region: Secondary | ICD-10-CM | POA: Diagnosis not present

## 2012-07-31 DIAGNOSIS — G43809 Other migraine, not intractable, without status migrainosus: Secondary | ICD-10-CM | POA: Diagnosis not present

## 2012-07-31 DIAGNOSIS — G4733 Obstructive sleep apnea (adult) (pediatric): Secondary | ICD-10-CM | POA: Diagnosis not present

## 2012-08-01 ENCOUNTER — Encounter: Payer: Self-pay | Admitting: Internal Medicine

## 2012-08-01 ENCOUNTER — Ambulatory Visit (INDEPENDENT_AMBULATORY_CARE_PROVIDER_SITE_OTHER): Payer: Medicare Other | Admitting: Internal Medicine

## 2012-08-01 VITALS — BP 130/78 | HR 77 | Ht 66.0 in | Wt 182.2 lb

## 2012-08-01 DIAGNOSIS — Z23 Encounter for immunization: Secondary | ICD-10-CM | POA: Diagnosis not present

## 2012-08-01 DIAGNOSIS — G4733 Obstructive sleep apnea (adult) (pediatric): Secondary | ICD-10-CM | POA: Diagnosis not present

## 2012-08-01 MED ORDER — MODAFINIL 200 MG PO TABS: 200.0000 mg | ORAL_TABLET | Freq: Every day | ORAL | Status: DC | PRN

## 2012-08-01 NOTE — Progress Notes (Signed)
Patient ID: Kari Flynn, female    DOB: Aug 05, 1951, 61 y.o.   MRN: [id number]  HPI 01/31/11- 79 yoF former smoker, followed for OSA, Insomnia, complicated by hx of fibromyalgia and migraine.  Last here June 23, 2010- note reviewed. Continues CPAP 10 w/ trazodone now 3 x 50 mg at bedtime, methadone in the mornings. For a couple of months, sleep has not been restorative and she has felt fatigued. She saw Dr Thyra Breed who got Epworth score 13/24. Says she has trouble with memory and concentration. Remembers past script for provigil, which she never tried.  Sleeps soundly 5-6 hrs, then awake when drugs wear off. Denies moving around much in bed. Aches all over and just as tired when she gets up as when she went to bed. Not told by husband that she is snoring through CPAP. On Disability since 2005- being reassessed. Naps minimally refreshing if she has to take one. 2 cups caf coffee in AM. Thyroid checked annually.   08/03/11-  59 yoF former smoker, followed for OSA, Insomnia, complicated by hx of fibromyalgia and migraine.  She continues to use CPAP all night every night at 10/[facility]. Much less need for afternoon naps. Provigil is being taken for 5 days of each week. By staying awake in the daytime she is now able to sleep better at night. She is satisfied with her care. Still taking trazodone for sleep and methadone 5 mg 3 times a day. We have discussed sedating potential of methadone.  08/01/12-  61 yoF former smoker, followed for OSA, Insomnia, complicated by hx of fibromyalgia and migraine.  FOLLOWS FOR: wears CPAP 10/ [facility] every night about 7 hours; would like to disucuss pressure-increase or not. CPAP definitely helps him using a fullface mask. Sleeps through the night. She is curious to try increasing pressure to see what that would be like. Provigil has been a big help taking one half tablet per day when needed. Treated by Dr. Vear Clock for fibromyalgia and migraine.  Now on Flexeril which also helps her sleep.  ROS-see HPI Constitutional:   No-   weight loss, night sweats, fevers, chills, fatigue, lassitude. HEENT:   No-  headaches, difficulty swallowing, tooth/dental problems, sore throat,       No-  sneezing, itching, ear ache, nasal congestion, post nasal drip,  CV:  No-   chest pain, orthopnea, PND, swelling in lower extremities, anasarca,  dizziness, palpitations Resp: No- acute  shortness of breath with exertion or at rest.              No-   productive cough,  No non-productive cough,  No- coughing up of blood.              No-   change in color of mucus.  No- wheezing.   Skin: No-   rash or lesions. GI:  No-   heartburn, indigestion, abdominal pain, nausea, vomiting,  GU:  MS:  No-   joint pain or swelling.   Neuro-     nothing unusual Psych:  No- change in mood or affect. No depression or anxiety.  No memory loss.   Objective:   Physical Exam General- Alert, Oriented, Affect-,, Distress- none acute; medium build Skin- rash-none, lesions- none, excoriation- none Lymphadenopathy- none Head- atraumatic            Eyes- Gross vision intact, PERRLA, conjunctivae clear secretions; periorbital edema  Ears- Hearing, canals-normal            Nose- Clear, no-Septal dev, mucus, polyps, erosion, perforation             Throat- Mallampati III-IV , mucosa clear , drainage- none, tonsils- atrophic Neck- flexible , trachea midline, no stridor , thyroid nl, carotid no bruit Chest - symmetrical excursion , unlabored           Heart/CV- RRR , no murmur , no gallop  , no rub, nl s1 s2                           - JVD- none , edema- none, stasis changes- none, varices- none           Lung- clear to P&A, wheeze- none, cough- none , dullness-none, rub- none           Chest wall-  Abd-  Br/ Gen/ Rectal- Not done, not indicated Extrem- cyanosis- none, clubbing, none, atrophy- none, strength- nl Neuro- grossly intact to observation

## 2012-08-01 NOTE — Patient Instructions (Addendum)
Order- DME [facility]- increase CPAP to 11 cwp     Dx OSA  Flu vax  Refill Provigil   Please call as needed

## 2012-08-11 NOTE — Assessment & Plan Note (Signed)
Good compliance and control. She is curious try a little higher pressure. We did discuss relationship between increasing pressure and leaks. Plan-increase CPAP to 11 Flu vaccine discussed and given

## 2012-09-03 ENCOUNTER — Other Ambulatory Visit: Payer: Medicare Other | Admitting: Internal Medicine

## 2012-09-03 DIAGNOSIS — M899 Disorder of bone, unspecified: Secondary | ICD-10-CM | POA: Diagnosis not present

## 2012-09-03 DIAGNOSIS — G4700 Insomnia, unspecified: Secondary | ICD-10-CM | POA: Diagnosis not present

## 2012-09-03 DIAGNOSIS — IMO0001 Reserved for inherently not codable concepts without codable children: Secondary | ICD-10-CM | POA: Diagnosis not present

## 2012-09-03 DIAGNOSIS — Z79899 Other long term (current) drug therapy: Secondary | ICD-10-CM | POA: Diagnosis not present

## 2012-09-03 DIAGNOSIS — M797 Fibromyalgia: Secondary | ICD-10-CM

## 2012-09-03 DIAGNOSIS — R0600 Dyspnea, unspecified: Secondary | ICD-10-CM

## 2012-09-03 DIAGNOSIS — Z0000 Encounter for general adult medical examination without abnormal findings: Secondary | ICD-10-CM

## 2012-09-03 DIAGNOSIS — R0989 Other specified symptoms and signs involving the circulatory and respiratory systems: Secondary | ICD-10-CM | POA: Diagnosis not present

## 2012-09-03 DIAGNOSIS — Z13220 Encounter for screening for lipoid disorders: Secondary | ICD-10-CM | POA: Diagnosis not present

## 2012-09-03 DIAGNOSIS — M8580 Other specified disorders of bone density and structure, unspecified site: Secondary | ICD-10-CM

## 2012-09-03 LAB — CBC WITH DIFFERENTIAL/PLATELET
Basophils Absolute: 0 10*3/uL (ref 0.0–0.1)
Basophils Relative: 1 % (ref 0–1)
Eosinophils Absolute: 0.1 10*3/uL (ref 0.0–0.7)
MCH: 29.8 pg (ref 26.0–34.0)
MCHC: 33.3 g/dL (ref 30.0–36.0)
Monocytes Relative: 10 % (ref 3–12)
Neutrophils Relative %: 49 % (ref 43–77)
Platelets: 211 10*3/uL (ref 150–400)
RDW: 13.4 % (ref 11.5–15.5)

## 2012-09-03 LAB — COMPREHENSIVE METABOLIC PANEL
Alkaline Phosphatase: 78 U/L (ref 39–117)
Glucose, Bld: 84 mg/dL (ref 70–99)
Sodium: 142 mEq/L (ref 135–145)
Total Bilirubin: 0.7 mg/dL (ref 0.3–1.2)
Total Protein: 6.4 g/dL (ref 6.0–8.3)

## 2012-09-03 LAB — LIPID PANEL
LDL Cholesterol: 107 mg/dL — ABNORMAL HIGH (ref 0–99)
Triglycerides: 80 mg/dL (ref <150)
VLDL: 16 mg/dL (ref 0–40)

## 2012-09-04 LAB — VITAMIN D 25 HYDROXY (VIT D DEFICIENCY, FRACTURES): Vit D, 25-Hydroxy: 32 ng/mL (ref 30–89)

## 2012-09-05 ENCOUNTER — Encounter: Payer: Self-pay | Admitting: Internal Medicine

## 2012-09-05 ENCOUNTER — Ambulatory Visit (INDEPENDENT_AMBULATORY_CARE_PROVIDER_SITE_OTHER): Payer: Medicare Other | Admitting: Internal Medicine

## 2012-09-05 VITALS — BP 106/68 | HR 76 | Temp 98.6°F | Ht 65.5 in | Wt 181.0 lb

## 2012-09-05 DIAGNOSIS — Z8669 Personal history of other diseases of the nervous system and sense organs: Secondary | ICD-10-CM | POA: Diagnosis not present

## 2012-09-05 DIAGNOSIS — R5381 Other malaise: Secondary | ICD-10-CM | POA: Diagnosis not present

## 2012-09-05 DIAGNOSIS — M1710 Unilateral primary osteoarthritis, unspecified knee: Secondary | ICD-10-CM | POA: Diagnosis not present

## 2012-09-05 DIAGNOSIS — G4733 Obstructive sleep apnea (adult) (pediatric): Secondary | ICD-10-CM

## 2012-09-05 DIAGNOSIS — Z0000 Encounter for general adult medical examination without abnormal findings: Secondary | ICD-10-CM

## 2012-09-05 DIAGNOSIS — M797 Fibromyalgia: Secondary | ICD-10-CM

## 2012-09-05 DIAGNOSIS — M170 Bilateral primary osteoarthritis of knee: Secondary | ICD-10-CM

## 2012-09-05 DIAGNOSIS — R5383 Other fatigue: Secondary | ICD-10-CM

## 2012-09-05 DIAGNOSIS — IMO0002 Reserved for concepts with insufficient information to code with codable children: Secondary | ICD-10-CM

## 2012-09-05 DIAGNOSIS — IMO0001 Reserved for inherently not codable concepts without codable children: Secondary | ICD-10-CM | POA: Diagnosis not present

## 2012-09-05 LAB — POCT URINALYSIS DIPSTICK
Bilirubin, UA: NEGATIVE
Ketones, UA: NEGATIVE
Leukocytes, UA: NEGATIVE

## 2012-09-05 NOTE — Patient Instructions (Addendum)
Continue same meds and return in one year. 

## 2012-09-05 NOTE — Progress Notes (Signed)
Subjective:    Patient ID: Kari Flynn, female    DOB: 08-30-1950, 62 y.o.   MRN: [id number]  HPI 62 year old white female for health maintenance and evaluation of medical problems. She is disabled due to frequent intractable migraine headaches and fibromyalgia. She used to work as a Designer, jewellery. She no 1999 and became disabled in 2004. Sees Dr. Thyra Breed, pain management physician. Sees Dr. Corliss Skains, rheumatologist. Patient is intolerant of Depakote as it has caused hair loss and diarrhea. History of sleep apnea, osteoarthritis of the knees. Uses CPAP machine followed by Dr. Fannie Knee. Had by Dr. Lina Sar in 2008.  Past medical history: D&C 1979, bilateral tubal ligation 1987, bunionectomy 1990, benign right breast biopsy February 1995.  Social history: Married with 2 sons. Quit smoking in 1979. Formerly smoked a pack per day for 10 years. Maybe 1 ounce of alcohol weekly.  Family history: Mother with history of hypertension and gout. Father died with multiple myeloma.  Tdap given 2012. Zostavax given 2010. Last Pap smear August 2013.  Patient is trying to control migraines with Relpax, methadone. Takes Provigil for chronic fatigue. Takes Flexeril for musculoskeletal pain. Takes Aleve for osteoarthritis. Takes calcium and vitamin D. Migraines continue to occur frequently.      Review of Systems     Objective:   Physical Exam  Vitals reviewed. Constitutional: She is oriented to person, place, and time. She appears well-developed and well-nourished. No distress.  HENT:  Head: Normocephalic and atraumatic.  Right Ear: External ear normal.  Left Ear: External ear normal.  Mouth/Throat: Oropharynx is clear and moist. No oropharyngeal exudate.  Eyes: Conjunctivae and EOM are normal. Pupils are equal, round, and reactive to light. Right eye exhibits no discharge. Left eye exhibits no discharge. No scleral icterus.  Neck: No JVD present. No tracheal deviation present.    Cardiovascular: Normal rate, regular rhythm, normal heart sounds and intact distal pulses.   No murmur heard. Pulmonary/Chest: Effort normal and breath sounds normal. No respiratory distress. She has no wheezes. She has no rales. She exhibits no tenderness.  Breasts normal female  Abdominal: Soft. Bowel sounds are normal. She exhibits no distension and no mass. There is no tenderness. There is no rebound and no guarding.  Genitourinary:  deferred to GYN  Musculoskeletal: Normal range of motion. She exhibits no edema.  Lymphadenopathy:    She has no cervical adenopathy.  Neurological: She is alert and oriented to person, place, and time. She has normal reflexes. No cranial nerve deficit. Coordination normal.  Skin: Skin is warm and dry. No rash noted. She is not diaphoretic.  Psychiatric: She has a normal mood and affect. Her behavior is normal. Judgment and thought content normal.          Assessment & Plan:   Fibromyalgia syndrome-stable  Osteoarthritis of knees and hands-seen by Dr. Corliss Skains for  Depression  Chronic fatigue  Sleep apnea-treated with CPAP  History of migraine headaches-still frequent  Plan return in one year. No change in medications. Reviewed with her her medical problems which seem to have not changed very much. I am still concerned about frequency of migraines.       Subjective:   Patient presents for Medicare Annual/Subsequent preventive examination.   Review Past Medical/Family/Social: See Epic   Risk Factors  Current exercise habits: walk 2/3 mile twice a week Dietary issues discussed: low fat low carb  Cardiac risk factors: family history of HTN aortic stenosis and a- fib in mother; one  brother with HTN  Depression Screen  (Note: if answer to either of the following is "Yes", a more complete depression screening is indicated)   Over the past two weeks, have you felt down, depressed or hopeless? No  Over the past two weeks, have you  felt little interest or pleasure in doing things? No Have you lost interest or pleasure in daily life? No Do you often feel hopeless? No Do you cry easily over simple problems? No   Activities of Daily Living  In your present state of health, do you have any difficulty performing the following activities?:   Driving? No  Managing money? No  Feeding yourself? No  Getting from bed to chair? No  Climbing a flight of stairs? No  Preparing food and eating?: No  Bathing or showering? No  Getting dressed: No  Getting to the toilet? No  Using the toilet:No  Moving around from place to place: No  In the past year have you fallen or had a near fall?:No  Are you sexually active? No  Do you have more than one partner? No   Hearing Difficulties: No  Do you often ask people to speak up or repeat themselves? No  Do you experience ringing or noises in your ears? No  Do you have difficulty understanding soft or whispered voices? No  Do you feel that you have a problem with memory? occasionally Do you often misplace items? No    Home Safety:  Do you have a smoke alarm at your residence? Yes Do you have grab bars in the bathroom? no Do you have throw rugs in your house? yes   Cognitive Testing  Alert? Yes Normal Appearance?Yes  Oriented to person? Yes Place? Yes  Time? Yes  Recall of three objects? Yes  Can perform simple calculations? Yes  Displays appropriate judgment?Yes  Can read the correct time from a watch face?Yes   List the Names of Other Physician/Practitioners you currently use:  See referral list for the physicians patient is currently seeing. Dr. Maple Hudson and Dr. Corliss Skains and Dr. Thyra Breed    Review of Systems:   Objective:     General appearance: Appears stated age Head: Normocephalic, without obvious abnormality, atraumatic  Eyes: conj clear, EOMi PEERLA  Ears: normal TM's and external ear canals both ears  Nose: Nares normal. Septum midline. Mucosa normal.  No drainage or sinus tenderness.  Throat: lips, mucosa, and tongue normal; teeth and gums normal  Neck: no adenopathy, no carotid bruit, no JVD, supple, symmetrical, trachea midline and thyroid not enlarged, symmetric, no tenderness/mass/nodules  No CVA tenderness.  Lungs: clear to auscultation bilaterally  Breasts: normal appearance, no masses or tenderness. Heart: regular rate and rhythm, S1, S2 normal, no murmur, click, rub or gallop  Abdomen: soft, non-tender; bowel sounds normal; no masses, no organomegaly  Musculoskeletal: ROM normal in all joints, no crepitus, no deformity, Normal muscle strengthen. Back  is symmetric, no curvature. Skin: Skin color, texture, turgor normal. No rashes or lesions  Lymph nodes: Cervical, supraclavicular, and axillary nodes normal.  Neurologic: CN 2 -12 Normal, Normal symmetric reflexes. Normal coordination and gait  Psych: Alert & Oriented x 3, Mood appear stable.    Assessment:    Annual wellness medicare exam   Plan:    During the course of the visit the patient was educated and counseled about appropriate screening and preventive services including:  Annual mammogram Colonoscopy done in 2008 and repeat in 2018  Patient Instructions (the written plan) was given to the patient.  Medicare Attestation  I have personally reviewed:  The patient's medical and social history  Their use of alcohol, tobacco or illicit drugs  Their current medications and supplements  The patient's functional ability including ADLs,fall risks, home safety risks, cognitive, and hearing and visual impairment  Diet and physical activities  Evidence for depression or mood disorders  The patient's weight, height, BMI, and visual acuity have been recorded in the chart. I have made referrals, counseling, and provided education to the patient based on review of the above and I have provided the patient with a written personalized care plan for preventive services.

## 2012-09-26 DIAGNOSIS — M47817 Spondylosis without myelopathy or radiculopathy, lumbosacral region: Secondary | ICD-10-CM | POA: Diagnosis not present

## 2012-09-26 DIAGNOSIS — M47812 Spondylosis without myelopathy or radiculopathy, cervical region: Secondary | ICD-10-CM | POA: Diagnosis not present

## 2012-09-26 DIAGNOSIS — IMO0001 Reserved for inherently not codable concepts without codable children: Secondary | ICD-10-CM | POA: Diagnosis not present

## 2012-09-26 DIAGNOSIS — G43809 Other migraine, not intractable, without status migrainosus: Secondary | ICD-10-CM | POA: Diagnosis not present

## 2012-11-20 DIAGNOSIS — IMO0001 Reserved for inherently not codable concepts without codable children: Secondary | ICD-10-CM | POA: Diagnosis not present

## 2012-11-20 DIAGNOSIS — G43809 Other migraine, not intractable, without status migrainosus: Secondary | ICD-10-CM | POA: Diagnosis not present

## 2012-11-20 DIAGNOSIS — M47812 Spondylosis without myelopathy or radiculopathy, cervical region: Secondary | ICD-10-CM | POA: Diagnosis not present

## 2012-11-20 DIAGNOSIS — M1710 Unilateral primary osteoarthritis, unspecified knee: Secondary | ICD-10-CM | POA: Diagnosis not present

## 2012-12-06 DIAGNOSIS — D3130 Benign neoplasm of unspecified choroid: Secondary | ICD-10-CM | POA: Diagnosis not present

## 2012-12-06 DIAGNOSIS — H2510 Age-related nuclear cataract, unspecified eye: Secondary | ICD-10-CM | POA: Diagnosis not present

## 2012-12-06 DIAGNOSIS — H02409 Unspecified ptosis of unspecified eyelid: Secondary | ICD-10-CM | POA: Diagnosis not present

## 2012-12-06 DIAGNOSIS — H5210 Myopia, unspecified eye: Secondary | ICD-10-CM | POA: Diagnosis not present

## 2012-12-10 DIAGNOSIS — R5381 Other malaise: Secondary | ICD-10-CM | POA: Diagnosis not present

## 2012-12-10 DIAGNOSIS — IMO0001 Reserved for inherently not codable concepts without codable children: Secondary | ICD-10-CM | POA: Diagnosis not present

## 2012-12-10 DIAGNOSIS — R5383 Other fatigue: Secondary | ICD-10-CM | POA: Diagnosis not present

## 2012-12-10 DIAGNOSIS — M1710 Unilateral primary osteoarthritis, unspecified knee: Secondary | ICD-10-CM | POA: Diagnosis not present

## 2012-12-10 DIAGNOSIS — M19049 Primary osteoarthritis, unspecified hand: Secondary | ICD-10-CM | POA: Diagnosis not present

## 2012-12-18 DIAGNOSIS — IMO0001 Reserved for inherently not codable concepts without codable children: Secondary | ICD-10-CM | POA: Diagnosis not present

## 2012-12-18 DIAGNOSIS — M47812 Spondylosis without myelopathy or radiculopathy, cervical region: Secondary | ICD-10-CM | POA: Diagnosis not present

## 2012-12-18 DIAGNOSIS — G43809 Other migraine, not intractable, without status migrainosus: Secondary | ICD-10-CM | POA: Diagnosis not present

## 2012-12-18 DIAGNOSIS — M47817 Spondylosis without myelopathy or radiculopathy, lumbosacral region: Secondary | ICD-10-CM | POA: Diagnosis not present

## 2013-02-06 DIAGNOSIS — M1710 Unilateral primary osteoarthritis, unspecified knee: Secondary | ICD-10-CM | POA: Diagnosis not present

## 2013-02-06 DIAGNOSIS — M47817 Spondylosis without myelopathy or radiculopathy, lumbosacral region: Secondary | ICD-10-CM | POA: Diagnosis not present

## 2013-02-06 DIAGNOSIS — Z79899 Other long term (current) drug therapy: Secondary | ICD-10-CM | POA: Diagnosis not present

## 2013-02-06 DIAGNOSIS — IMO0001 Reserved for inherently not codable concepts without codable children: Secondary | ICD-10-CM | POA: Diagnosis not present

## 2013-02-25 ENCOUNTER — Other Ambulatory Visit: Payer: Self-pay | Admitting: Internal Medicine

## 2013-02-25 DIAGNOSIS — M1710 Unilateral primary osteoarthritis, unspecified knee: Secondary | ICD-10-CM | POA: Diagnosis not present

## 2013-02-25 MED ORDER — MODAFINIL 200 MG PO TABS: 200.0000 mg | ORAL_TABLET | Freq: Every day | ORAL | Status: DC | PRN

## 2013-02-25 NOTE — Telephone Encounter (Signed)
Per CY-okay to refill and I called refill to pharmacy voicemail.

## 2013-03-04 DIAGNOSIS — M1710 Unilateral primary osteoarthritis, unspecified knee: Secondary | ICD-10-CM | POA: Diagnosis not present

## 2013-03-06 ENCOUNTER — Other Ambulatory Visit: Payer: Medicare Other | Admitting: Internal Medicine

## 2013-03-06 DIAGNOSIS — E039 Hypothyroidism, unspecified: Secondary | ICD-10-CM

## 2013-03-06 LAB — TSH: TSH: 3.669 u[IU]/mL (ref 0.350–4.500)

## 2013-03-10 NOTE — Progress Notes (Signed)
Patient informed. 

## 2013-03-11 DIAGNOSIS — M1710 Unilateral primary osteoarthritis, unspecified knee: Secondary | ICD-10-CM | POA: Diagnosis not present

## 2013-04-29 DIAGNOSIS — M1710 Unilateral primary osteoarthritis, unspecified knee: Secondary | ICD-10-CM | POA: Diagnosis not present

## 2013-04-29 DIAGNOSIS — IMO0001 Reserved for inherently not codable concepts without codable children: Secondary | ICD-10-CM | POA: Diagnosis not present

## 2013-04-29 DIAGNOSIS — R5381 Other malaise: Secondary | ICD-10-CM | POA: Diagnosis not present

## 2013-05-07 DIAGNOSIS — M47812 Spondylosis without myelopathy or radiculopathy, cervical region: Secondary | ICD-10-CM | POA: Diagnosis not present

## 2013-05-07 DIAGNOSIS — IMO0001 Reserved for inherently not codable concepts without codable children: Secondary | ICD-10-CM | POA: Diagnosis not present

## 2013-05-07 DIAGNOSIS — M47817 Spondylosis without myelopathy or radiculopathy, lumbosacral region: Secondary | ICD-10-CM | POA: Diagnosis not present

## 2013-05-07 DIAGNOSIS — G43809 Other migraine, not intractable, without status migrainosus: Secondary | ICD-10-CM | POA: Diagnosis not present

## 2013-06-04 DIAGNOSIS — G43809 Other migraine, not intractable, without status migrainosus: Secondary | ICD-10-CM | POA: Diagnosis not present

## 2013-06-04 DIAGNOSIS — M1710 Unilateral primary osteoarthritis, unspecified knee: Secondary | ICD-10-CM | POA: Diagnosis not present

## 2013-06-04 DIAGNOSIS — M47812 Spondylosis without myelopathy or radiculopathy, cervical region: Secondary | ICD-10-CM | POA: Diagnosis not present

## 2013-06-30 ENCOUNTER — Other Ambulatory Visit: Payer: Self-pay

## 2013-06-30 DIAGNOSIS — Z1231 Encounter for screening mammogram for malignant neoplasm of breast: Secondary | ICD-10-CM

## 2013-07-02 DIAGNOSIS — IMO0001 Reserved for inherently not codable concepts without codable children: Secondary | ICD-10-CM | POA: Diagnosis not present

## 2013-07-02 DIAGNOSIS — G43809 Other migraine, not intractable, without status migrainosus: Secondary | ICD-10-CM | POA: Diagnosis not present

## 2013-07-02 DIAGNOSIS — M47812 Spondylosis without myelopathy or radiculopathy, cervical region: Secondary | ICD-10-CM | POA: Diagnosis not present

## 2013-07-02 DIAGNOSIS — G894 Chronic pain syndrome: Secondary | ICD-10-CM | POA: Diagnosis not present

## 2013-07-15 DIAGNOSIS — M1710 Unilateral primary osteoarthritis, unspecified knee: Secondary | ICD-10-CM | POA: Diagnosis not present

## 2013-07-15 DIAGNOSIS — IMO0001 Reserved for inherently not codable concepts without codable children: Secondary | ICD-10-CM | POA: Diagnosis not present

## 2013-07-30 ENCOUNTER — Ambulatory Visit
Admission: RE | Admit: 2013-07-30 | Discharge: 2013-07-30 | Disposition: A | Discharge status: Home / Self Care | Payer: Medicare Other | Source: Ambulatory Visit

## 2013-07-30 DIAGNOSIS — Z1231 Encounter for screening mammogram for malignant neoplasm of breast: Secondary | ICD-10-CM

## 2013-08-01 ENCOUNTER — Encounter: Payer: Self-pay | Admitting: Internal Medicine

## 2013-08-01 ENCOUNTER — Encounter (INDEPENDENT_AMBULATORY_CARE_PROVIDER_SITE_OTHER): Payer: Self-pay

## 2013-08-01 ENCOUNTER — Ambulatory Visit (INDEPENDENT_AMBULATORY_CARE_PROVIDER_SITE_OTHER): Payer: Medicare Other | Admitting: Internal Medicine

## 2013-08-01 VITALS — BP 120/70 | HR 71 | Ht 66.0 in | Wt 182.0 lb

## 2013-08-01 DIAGNOSIS — G4733 Obstructive sleep apnea (adult) (pediatric): Secondary | ICD-10-CM | POA: Diagnosis not present

## 2013-08-01 DIAGNOSIS — Z23 Encounter for immunization: Secondary | ICD-10-CM | POA: Diagnosis not present

## 2013-08-01 NOTE — Progress Notes (Signed)
Patient ID: Kari Flynn, female    DOB: 28-Nov-1950, 62 y.o.   MRN: [id number]  HPI 01/31/11- 68 yoF former smoker, followed for OSA, Insomnia, complicated by hx of fibromyalgia and migraine.  Last here June 23, 2010- note reviewed. Continues CPAP 10 w/ trazodone now 3 x 50 mg at bedtime, methadone in the mornings. For a couple of months, sleep has not been restorative and she has felt fatigued. She saw Dr Thyra Breed who got Epworth score 13/24. Says she has trouble with memory and concentration. Remembers past script for provigil, which she never tried.  Sleeps soundly 5-6 hrs, then awake when drugs wear off. Denies moving around much in bed. Aches all over and just as tired when she gets up as when she went to bed. Not told by husband that she is snoring through CPAP. On Disability since 2005- being reassessed. Naps minimally refreshing if she has to take one. 2 cups caf coffee in AM. Thyroid checked annually.   08/03/11-  59 yoF former smoker, followed for OSA, Insomnia, complicated by hx of fibromyalgia and migraine.  She continues to use CPAP all night every night at 10/[facility]. Much less need for afternoon naps. Provigil is being taken for 5 days of each week. By staying awake in the daytime she is now able to sleep better at night. She is satisfied with her care. Still taking trazodone for sleep and methadone 5 mg 3 times a day. We have discussed sedating potential of methadone.  08/01/12-  61 yoF former smoker, followed for OSA, Insomnia, complicated by hx of fibromyalgia and migraine.  FOLLOWS FOR: wears CPAP 10/ [facility] every night about 7 hours; would like to disucuss pressure-increase or not. CPAP definitely helps her using a fullface mask. Sleeps through the night. She is curious to try increasing pressure to see what that would be like. Provigil has been a big help taking one half tablet per day when needed. Treated by Dr. Vear Clock for fibromyalgia and migraine.  Now on Flexeril which also helps her sleep.  08/01/13- 62 yoF former smoker, followed for OSA, Insomnia, complicated by hx of fibromyalgia and migraine. Follows [facility] CPAP 6-7 hrs /night -Mask fits well -   [facility] : Pt is eligible for a new machine - Patient says the machine is making nose/ sounds different.She continues to wear it every night and it has definitely helped. Residual daytime somnolence is controlled with appropriate use of Provigil when needed.   Wants flu shot - Needs refill on Provigil  ROS-see HPI Constitutional:   No-   weight loss, night sweats, fevers, chills, fatigue, lassitude. HEENT:   No-  headaches, difficulty swallowing, tooth/dental problems, sore throat,       No-  sneezing, itching, ear ache, nasal congestion, post nasal drip,  CV:  No-   chest pain, orthopnea, PND, swelling in lower extremities, anasarca,  dizziness, palpitations Resp: No- acute  shortness of breath with exertion or at rest.              No-   productive cough,  No non-productive cough,  No- coughing up of blood.              No-   change in color of mucus.  No- wheezing.   Skin: No-   rash or lesions. GI:  No-   heartburn, indigestion, abdominal pain, nausea, vomiting,  GU:  MS:  No-   joint pain or swelling.  Neuro-     nothing unusual Psych:  No- change in mood or affect. No depression or anxiety.  No memory loss.   Objective:   Physical Exam General- Alert, Oriented, Affect-normal, Distress- none acute; medium build Skin- rash-none, lesions- none, excoriation- none Lymphadenopathy- none Head- atraumatic            Eyes- Gross vision intact, PERRLA, conjunctivae clear secretions; periorbital edema            Ears- Hearing, canals-normal            Nose- Clear, no-Septal dev, mucus, polyps, erosion, perforation             Throat- Mallampati III-IV , mucosa clear , drainage- none, tonsils- atrophic Neck- flexible , trachea midline, no stridor , thyroid nl, carotid no  bruit Chest - symmetrical excursion , unlabored           Heart/CV- RRR , no murmur , no gallop  , no rub, nl s1 s2                           - JVD- none , edema- none, stasis changes- none, varices- none           Lung- clear to P&A, wheeze- none, cough- none , dullness-none, rub- none           Chest wall-  Abd-  Br/ Gen/ Rectal- Not done, not indicated Extrem- cyanosis- none, clubbing, none, atrophy- none, strength- nl Neuro- grossly intact to observation

## 2013-08-01 NOTE — Patient Instructions (Signed)
Order DME [facility]    Replacement for old, worn CPAP machine, 11 cwp, mask of choice, humidifier, supplies   Dx OSA  Flu vax

## 2013-08-04 DIAGNOSIS — M1710 Unilateral primary osteoarthritis, unspecified knee: Secondary | ICD-10-CM | POA: Diagnosis not present

## 2013-08-04 DIAGNOSIS — M47812 Spondylosis without myelopathy or radiculopathy, cervical region: Secondary | ICD-10-CM | POA: Diagnosis not present

## 2013-08-04 DIAGNOSIS — G43809 Other migraine, not intractable, without status migrainosus: Secondary | ICD-10-CM | POA: Diagnosis not present

## 2013-08-04 DIAGNOSIS — IMO0001 Reserved for inherently not codable concepts without codable children: Secondary | ICD-10-CM | POA: Diagnosis not present

## 2013-08-14 ENCOUNTER — Telehealth: Payer: Self-pay | Admitting: Internal Medicine

## 2013-08-14 NOTE — Telephone Encounter (Signed)
lmomtcb for whitney.

## 2013-08-15 NOTE — Telephone Encounter (Signed)
lmtcb for Kari Flynn

## 2013-08-18 NOTE — Telephone Encounter (Signed)
Patient calling stating she still has not heard anything about new cpap. [phone]

## 2013-08-18 NOTE — Telephone Encounter (Signed)
I have not received any papers or requests on patient; [facility] has closed for the day; Will call first thing in the morning and find out what the hold up is.

## 2013-08-18 NOTE — Telephone Encounter (Signed)
I called and spoke with pt and made her aware of the below. Will forward back to [facility]

## 2013-08-18 NOTE — Telephone Encounter (Signed)
Called [facility], spoke with [place].  They faxed a physician order to a fax # of [phone].  Not sure where this # is to. Durward Mallard will refax to triage.  The Physician order will need to be signed by CDY and faxed back with the last OV note.  Will route to Florentina Addison -- will you please keep an eye out for this form?  Thank you.

## 2013-08-19 NOTE — Telephone Encounter (Signed)
I called [facility]. Was advised this will be refaxed to triage fax #. Will forward to [place] as an [place]

## 2013-08-20 NOTE — Telephone Encounter (Signed)
I have received the forms from [facility] and placed with message on CY's cart.

## 2013-08-20 NOTE — Telephone Encounter (Signed)
I have received papers back from CY and faxed along with OV notes from 08-01-13. Nothing more needed.

## 2013-08-20 NOTE — Telephone Encounter (Signed)
I do not see form in triage. Katie, do you know if this was received?

## 2013-08-20 NOTE — Telephone Encounter (Signed)
I called [facility] again this morning as no forms have been faxed to my attention regarding this patient. Jolie at [facility] is re faxing the forms to my attention STAT.

## 2013-08-20 NOTE — Assessment & Plan Note (Signed)
Good CPAP compliance and control Still needs occasional Provigil for residual daytime sleepiness. Does not sound as if pressure change needed, but older machine is sounding wrong and might not be holding desired pressure.  Plan- Replacement CPAP machine/ [facility]

## 2013-08-26 ENCOUNTER — Telehealth: Payer: Self-pay | Admitting: Internal Medicine

## 2013-08-26 MED ORDER — MODAFINIL 200 MG PO TABS: 200.0000 mg | ORAL_TABLET | Freq: Every day | ORAL | Status: DC | PRN

## 2013-08-26 NOTE — Telephone Encounter (Signed)
Received fax refill request on Provigil  Last OV 08/01/13 Pending OV 08/03/14 Last fill 02/25/13 with 1 additional refill  CY - please advise on refill. Thanks.

## 2013-08-26 NOTE — Telephone Encounter (Signed)
Ok to refill x 6 months 

## 2013-08-26 NOTE — Telephone Encounter (Signed)
Called refill to pharmacy voicemail.  

## 2013-08-27 ENCOUNTER — Telehealth: Payer: Self-pay | Admitting: Internal Medicine

## 2013-08-27 NOTE — Telephone Encounter (Signed)
Pt calling wanting to know what is going on with getting CPAP replacement. DME [facility] Pt states that they were supposed to fax something over for Dr Maple Hudson to sign x 2 weeks ago. Pt has not received any equipment from DME d/t them not having proper documentation of medical necessity.  Pt is frustrated that she has not received anything and has not heard anything from our office or [facility] as to what's going on.  Please advise Florentina Addison if you have received anything from [facility]. Thanks.

## 2013-08-27 NOTE — Telephone Encounter (Signed)
I called [facility]-was told they attempted to call patient for the first time today and left message for her to call back; as far as the provigil Rx- Pt is aware that I received a PA today for this and will fax back on Friday. Pt then stated that [facility] called her in the meantime and everything has been handled. Pt will await a call back about the PA decision.

## 2013-09-01 MED ORDER — ARMODAFINIL 250 MG PO TABS: 250.0000 mg | ORAL_TABLET | Freq: Every day | ORAL | Status: DC

## 2013-09-01 NOTE — Telephone Encounter (Signed)
Pt called wanting to know the status of her PA for Provigil at lunch time. I spoke directly with patient-she is aware that CY is working on this and we will keep her posted. Pt then requested to have something to use in the meantime as her mother is ill and she needs to stay alert as possible during this time. CY agreed and gave a sample of Nuvigil 250 mg for now; Pt is aware and will come by the office on 09-02-13 to pick up sample. I have documented the sample.

## 2013-09-09 NOTE — Telephone Encounter (Signed)
Katie any update on the PA for the provigil?

## 2013-09-15 DIAGNOSIS — M1710 Unilateral primary osteoarthritis, unspecified knee: Secondary | ICD-10-CM | POA: Diagnosis not present

## 2013-09-16 ENCOUNTER — Telehealth: Payer: Self-pay | Admitting: Internal Medicine

## 2013-09-16 ENCOUNTER — Other Ambulatory Visit: Payer: Medicare Other | Admitting: Internal Medicine

## 2013-09-16 DIAGNOSIS — Z13220 Encounter for screening for lipoid disorders: Secondary | ICD-10-CM

## 2013-09-16 DIAGNOSIS — Z1329 Encounter for screening for other suspected endocrine disorder: Secondary | ICD-10-CM

## 2013-09-16 DIAGNOSIS — Z130 Encounter for screening for diseases of the blood and blood-forming organs and certain disorders involving the immune mechanism: Secondary | ICD-10-CM

## 2013-09-16 DIAGNOSIS — Z0000 Encounter for general adult medical examination without abnormal findings: Secondary | ICD-10-CM | POA: Diagnosis not present

## 2013-09-16 DIAGNOSIS — Z13228 Encounter for screening for other metabolic disorders: Principal | ICD-10-CM

## 2013-09-16 LAB — CBC WITH DIFFERENTIAL/PLATELET
Basophils Absolute: 0 10*3/uL (ref 0.0–0.1)
Basophils Relative: 1 % (ref 0–1)
EOS ABS: 0.1 10*3/uL (ref 0.0–0.7)
Eosinophils Relative: 2 % (ref 0–5)
HCT: 40.7 % (ref 36.0–46.0)
Hemoglobin: 13.9 g/dL (ref 12.0–15.0)
LYMPHS ABS: 1.3 10*3/uL (ref 0.7–4.0)
Lymphocytes Relative: 36 % (ref 12–46)
MCH: 30.1 pg (ref 26.0–34.0)
MCHC: 34.2 g/dL (ref 30.0–36.0)
MCV: 88.1 fL (ref 78.0–100.0)
MONOS PCT: 9 % (ref 3–12)
Monocytes Absolute: 0.3 10*3/uL (ref 0.1–1.0)
NEUTROS ABS: 2 10*3/uL (ref 1.7–7.7)
NEUTROS PCT: 52 % (ref 43–77)
PLATELETS: 223 10*3/uL (ref 150–400)
RBC: 4.62 MIL/uL (ref 3.87–5.11)
RDW: 13.8 % (ref 11.5–15.5)
WBC: 3.7 10*3/uL — ABNORMAL LOW (ref 4.0–10.5)

## 2013-09-16 LAB — TSH: TSH: 2.33 u[IU]/mL (ref 0.350–4.500)

## 2013-09-16 LAB — LIPID PANEL
Cholesterol: 180 mg/dL (ref 0–200)
HDL: 69 mg/dL (ref >39)
LDL Cholesterol: 94 mg/dL (ref 0–99)
TRIGLYCERIDES: 83 mg/dL (ref <150)
Total CHOL/HDL Ratio: 2.6 Ratio
VLDL: 17 mg/dL (ref 0–40)

## 2013-09-16 LAB — COMPREHENSIVE METABOLIC PANEL
ALBUMIN: 3.9 g/dL (ref 3.5–5.2)
ALT: 25 U/L (ref 0–35)
AST: 30 U/L (ref 0–37)
Alkaline Phosphatase: 77 U/L (ref 39–117)
BILIRUBIN TOTAL: 0.4 mg/dL (ref 0.3–1.2)
BUN: 20 mg/dL (ref 6–23)
CO2: 31 mEq/L (ref 19–32)
Calcium: 9.4 mg/dL (ref 8.4–10.5)
Chloride: 104 mEq/L (ref 96–112)
Creat: 0.68 mg/dL (ref 0.50–1.10)
GLUCOSE: 86 mg/dL (ref 70–99)
POTASSIUM: 4.3 meq/L (ref 3.5–5.3)
SODIUM: 141 meq/L (ref 135–145)
TOTAL PROTEIN: 6.1 g/dL (ref 6.0–8.3)

## 2013-09-16 MED ORDER — MODAFINIL 200 MG PO TABS: 200.0000 mg | ORAL_TABLET | Freq: Every day | ORAL | Status: DC | PRN

## 2013-09-16 NOTE — Telephone Encounter (Signed)
I have received a PA approval for Provigil from 09-05-2013  Through 09-05-2014.

## 2013-09-16 NOTE — Telephone Encounter (Signed)
Spoke with patient-aware I just got approval for Provigil; however I have NOT received any refill requests from [place]. Pt is aware that I am calling [facility] now to give verbal refill over the phone and to make sure they have the correct fax number for our office.   Rx was called to [facility]-they have the correct fax number-unsure as to why I have not received a request. Pt is aware and nothing more needed at this time.

## 2013-09-17 LAB — VITAMIN D 25 HYDROXY (VIT D DEFICIENCY, FRACTURES): VIT D 25 HYDROXY: 40 ng/mL (ref 30–89)

## 2013-09-18 ENCOUNTER — Encounter: Payer: Medicare Other | Admitting: Internal Medicine

## 2013-09-22 DIAGNOSIS — M1710 Unilateral primary osteoarthritis, unspecified knee: Secondary | ICD-10-CM | POA: Diagnosis not present

## 2013-09-29 DIAGNOSIS — G894 Chronic pain syndrome: Secondary | ICD-10-CM | POA: Diagnosis not present

## 2013-09-29 DIAGNOSIS — M47812 Spondylosis without myelopathy or radiculopathy, cervical region: Secondary | ICD-10-CM | POA: Diagnosis not present

## 2013-09-29 DIAGNOSIS — M1710 Unilateral primary osteoarthritis, unspecified knee: Secondary | ICD-10-CM | POA: Diagnosis not present

## 2013-09-29 DIAGNOSIS — M47817 Spondylosis without myelopathy or radiculopathy, lumbosacral region: Secondary | ICD-10-CM | POA: Diagnosis not present

## 2013-09-29 DIAGNOSIS — IMO0002 Reserved for concepts with insufficient information to code with codable children: Secondary | ICD-10-CM | POA: Diagnosis not present

## 2013-09-30 DIAGNOSIS — M1710 Unilateral primary osteoarthritis, unspecified knee: Secondary | ICD-10-CM | POA: Diagnosis not present

## 2013-10-03 ENCOUNTER — Encounter (HOSPITAL_COMMUNITY): Payer: Self-pay | Admitting: Emergency Medicine

## 2013-10-03 ENCOUNTER — Emergency Department (HOSPITAL_COMMUNITY)
Admission: EM | Admit: 2013-10-03 | Discharge: 2013-10-03 | Disposition: A | Discharge status: Home / Self Care | Payer: Medicare Other | Attending: Emergency Medicine | Admitting: Emergency Medicine

## 2013-10-03 ENCOUNTER — Emergency Department (HOSPITAL_COMMUNITY): Payer: Medicare Other

## 2013-10-03 DIAGNOSIS — IMO0001 Reserved for inherently not codable concepts without codable children: Secondary | ICD-10-CM | POA: Diagnosis not present

## 2013-10-03 DIAGNOSIS — Z7982 Long term (current) use of aspirin: Secondary | ICD-10-CM | POA: Diagnosis not present

## 2013-10-03 DIAGNOSIS — R109 Unspecified abdominal pain: Secondary | ICD-10-CM

## 2013-10-03 DIAGNOSIS — R1031 Right lower quadrant pain: Secondary | ICD-10-CM | POA: Diagnosis not present

## 2013-10-03 DIAGNOSIS — R112 Nausea with vomiting, unspecified: Secondary | ICD-10-CM | POA: Diagnosis not present

## 2013-10-03 DIAGNOSIS — M129 Arthropathy, unspecified: Secondary | ICD-10-CM | POA: Insufficient documentation

## 2013-10-03 DIAGNOSIS — K7689 Other specified diseases of liver: Secondary | ICD-10-CM | POA: Diagnosis not present

## 2013-10-03 DIAGNOSIS — Z8669 Personal history of other diseases of the nervous system and sense organs: Secondary | ICD-10-CM | POA: Insufficient documentation

## 2013-10-03 DIAGNOSIS — Z79899 Other long term (current) drug therapy: Secondary | ICD-10-CM | POA: Insufficient documentation

## 2013-10-03 DIAGNOSIS — Z87891 Personal history of nicotine dependence: Secondary | ICD-10-CM | POA: Diagnosis not present

## 2013-10-03 DIAGNOSIS — G43909 Migraine, unspecified, not intractable, without status migrainosus: Secondary | ICD-10-CM | POA: Diagnosis not present

## 2013-10-03 HISTORY — DX: Unspecified osteoarthritis, unspecified site: M19.90

## 2013-10-03 LAB — CBC WITH DIFFERENTIAL/PLATELET
BASOS ABS: 0 10*3/uL (ref 0.0–0.1)
BASOS PCT: 0 % (ref 0–1)
Eosinophils Absolute: 0 10*3/uL (ref 0.0–0.7)
Eosinophils Relative: 0 % (ref 0–5)
HCT: 40.2 % (ref 36.0–46.0)
Hemoglobin: 13.8 g/dL (ref 12.0–15.0)
LYMPHS PCT: 11 % — AB (ref 12–46)
Lymphs Abs: 0.9 10*3/uL (ref 0.7–4.0)
MCH: 30.9 pg (ref 26.0–34.0)
MCHC: 34.3 g/dL (ref 30.0–36.0)
MCV: 89.9 fL (ref 78.0–100.0)
MONO ABS: 0.4 10*3/uL (ref 0.1–1.0)
Monocytes Relative: 5 % (ref 3–12)
NEUTROS ABS: 6.4 10*3/uL (ref 1.7–7.7)
NEUTROS PCT: 83 % — AB (ref 43–77)
Platelets: 221 10*3/uL (ref 150–400)
RBC: 4.47 MIL/uL (ref 3.87–5.11)
RDW: 13.4 % (ref 11.5–15.5)
WBC: 7.8 10*3/uL (ref 4.0–10.5)

## 2013-10-03 LAB — COMPREHENSIVE METABOLIC PANEL WITH GFR
ALT: 17 U/L (ref 0–35)
AST: 18 U/L (ref 0–37)
Albumin: 4 g/dL (ref 3.5–5.2)
Alkaline Phosphatase: 95 U/L (ref 39–117)
BUN: 18 mg/dL (ref 6–23)
CO2: 26 meq/L (ref 19–32)
Calcium: 9.4 mg/dL (ref 8.4–10.5)
Chloride: 102 meq/L (ref 96–112)
Creatinine, Ser: 0.7 mg/dL (ref 0.50–1.10)
GFR calc Af Amer: >90 mL/min
GFR calc non Af Amer: 90 mL/min — ABNORMAL LOW
Glucose, Bld: 86 mg/dL (ref 70–99)
Potassium: 4 meq/L (ref 3.7–5.3)
Sodium: 143 meq/L (ref 137–147)
Total Bilirubin: 0.7 mg/dL (ref 0.3–1.2)
Total Protein: 7.2 g/dL (ref 6.0–8.3)

## 2013-10-03 LAB — URINALYSIS, ROUTINE W REFLEX MICROSCOPIC
BILIRUBIN URINE: NEGATIVE
Glucose, UA: NEGATIVE mg/dL
HGB URINE DIPSTICK: NEGATIVE
KETONES UR: 15 mg/dL — AB
LEUKOCYTES UA: NEGATIVE
Nitrite: NEGATIVE
PROTEIN: NEGATIVE mg/dL
Specific Gravity, Urine: 1.02 (ref 1.005–1.030)
Urobilinogen, UA: 1 mg/dL (ref 0.0–1.0)
pH: 7 (ref 5.0–8.0)

## 2013-10-03 LAB — WET PREP, GENITAL
Clue Cells Wet Prep HPF POC: NONE SEEN
Trich, Wet Prep: NONE SEEN
WBC WET PREP: NONE SEEN
Yeast Wet Prep HPF POC: NONE SEEN

## 2013-10-03 LAB — LIPASE, BLOOD: Lipase: 38 U/L (ref 11–59)

## 2013-10-03 MED ORDER — MORPHINE SULFATE 4 MG/ML IJ SOLN
4.0000 mg | Freq: Once | INTRAMUSCULAR | Status: AC
  Administered 2013-10-03: 4 mg via INTRAVENOUS
  Filled 2013-10-03: qty 1

## 2013-10-03 MED ORDER — SODIUM CHLORIDE 0.9 % IV BOLUS (SEPSIS)
1000.0000 mL | Freq: Once | INTRAVENOUS | Status: AC
  Administered 2013-10-03: 1000 mL via INTRAVENOUS

## 2013-10-03 MED ORDER — POLYETHYLENE GLYCOL 3350 17 GM/SCOOP PO POWD
17.0000 g | Freq: Two times a day (BID) | ORAL | Status: DC
Start: 2013-10-03 — End: 2014-01-23

## 2013-10-03 MED ORDER — ONDANSETRON HCL 4 MG/2ML IJ SOLN
4.0000 mg | Freq: Once | INTRAMUSCULAR | Status: AC
  Administered 2013-10-03: 4 mg via INTRAVENOUS
  Filled 2013-10-03: qty 2

## 2013-10-03 MED ORDER — IOHEXOL 300 MG/ML  SOLN
100.0000 mL | Freq: Once | INTRAMUSCULAR | Status: AC | PRN
  Administered 2013-10-03: 100 mL via INTRAVENOUS

## 2013-10-03 MED ORDER — DOCUSATE SODIUM 100 MG PO CAPS: 100.0000 mg | ORAL_CAPSULE | Freq: Two times a day (BID) | ORAL | Status: DC

## 2013-10-03 MED ORDER — IOHEXOL 300 MG/ML  SOLN
25.0000 mL | INTRAMUSCULAR | Status: AC
  Administered 2013-10-03 (×2): 25 mL via ORAL

## 2013-10-03 NOTE — ED Notes (Signed)
Rt. Lower quadrant abdominal pain began 3 weeks ago, but  This am has been severe and constant.  Pt. Has vomited X1 this am and is nauseated. Denies any constipation or loose stool.  Denies any UTI symptoms, or vaginal symptoms.   Denies any fever but did have chills.  Described as sharp pain , constant radiates towards the middle.

## 2013-10-03 NOTE — Discharge Instructions (Signed)
Abdominal Pain, Adult °Many things can cause abdominal pain. Usually, abdominal pain is not caused by a disease and will improve without treatment. It can often be observed and treated at home. Your health care provider will do a physical exam and possibly order blood tests and X-rays to help determine the seriousness of your pain. However, in many cases, more time must pass before a clear cause of the pain can be found. Before that point, your health care provider may not know if you need more testing or further treatment. °HOME CARE INSTRUCTIONS  °Monitor your abdominal pain for any changes. The following actions may help to alleviate any discomfort you are experiencing: °· Only take over-the-counter or prescription medicines as directed by your health care provider. °· Do not take laxatives unless directed to do so by your health care provider. °· Try a clear liquid diet (broth, tea, or water) as directed by your health care provider. Slowly move to a bland diet as tolerated. °SEEK MEDICAL CARE IF: °· You have unexplained abdominal pain. °· You have abdominal pain associated with nausea or diarrhea. °· You have pain when you urinate or have a bowel movement. °· You experience abdominal pain that wakes you in the night. °· You have abdominal pain that is worsened or improved by eating food. °· You have abdominal pain that is worsened with eating fatty foods. °SEEK IMMEDIATE MEDICAL CARE IF:  °· Your pain does not go away within 2 hours. °· You have a fever. °· You keep throwing up (vomiting). °· Your pain is felt only in portions of the abdomen, such as the right side or the left lower portion of the abdomen. °· You pass bloody or black tarry stools. °MAKE SURE YOU: °· Understand these instructions.   °· Will watch your condition.   °· Will get help right away if you are not doing well or get worse.   °Document Released: 05/24/2005 Document Revised: 06/04/2013 Document Reviewed: 04/23/2013 °[employer]® Patient  Information ©2014 [employer]. ° °

## 2013-10-03 NOTE — ED Notes (Signed)
CT notified that patient has finished her contrast. 

## 2013-10-03 NOTE — ED Provider Notes (Signed)
Medical screening examination/treatment/procedure(s) were performed by non-physician practitioner and as supervising physician I was immediately available for consultation/collaboration.  EKG Interpretation   None         Delice Bison Koi Zangara, DO 10/03/13 1451

## 2013-10-03 NOTE — ED Provider Notes (Signed)
CSN: [id number]     Arrival date & time 10/03/13  1010 History   First MD Initiated Contact with Patient 10/03/13 1018     Chief Complaint  Patient presents with  . Abdominal Pain   (Consider location/radiation/quality/duration/timing/severity/associated sxs/prior Treatment) HPI Comments: Patient presents to the emergency department with chief complaint of right lower quadrant pain. Patient states that the pain began about 3 weeks ago but recently became severe and constant this morning. She states that the pain radiates from her right lower quadrant to the midline. She states that she has vomited one time this morning, and feels nauseated. She denies any bloody or bilious emesis. Denies any melena. She denies diarrhea or constipation, and states that her bowel movements have been normal. She states that her pain is 10 out of 10. She states that she has a PCP appointment on Monday, but that this could not wait until then. She takes methadone for migraines. 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 She is concerned about her appendix. She denies any fevers, chills, chest pain, shortness of breath, vaginal discharge, or dysuria.  The history is provided by the patient. No language interpreter was used.    Past Medical History  Diagnosis Date  . Dyspnea   . Migraine, unspecified, without mention of intractable migraine without mention of status migrainosus   . Myalgia and myositis, unspecified   . Obstructive sleep apnea (adult) (pediatric)   . Arthritis    Past Surgical History  Procedure Laterality Date  . Bunionectomy      x2  . Blepharoplasty    . Breast biopsy      B9   Family History  Problem Relation Age of Onset  . Hypertension Mother   . Arthritis Mother   . Other Mother     degeneration  . Hyperlipidemia Mother   . Melanoma Mother   . Multiple myeloma Father   . Arthritis      sibling  . Sarcoidosis      sibling-also has NIDDM   History  Substance Use Topics  .  Smoking status: Former Smoker -- 1.00 packs/day for 10 years    Types: Cigarettes    Quit date: 08/28/1977  . Smokeless tobacco: Not on file  . Alcohol Use: 1.2 oz/week    2 Glasses of wine per week     Comment: monthly   OB History   Grav Para Term Preterm Abortions TAB SAB Ect Mult Living                 Review of Systems  All other systems reviewed and are negative.    Allergies  Divalproex sodium  Home Medications   Current Outpatient Rx  Name  Route  Sig  Dispense  Refill  . Armodafinil 250 MG tablet   Oral   Take 1 tablet (250 mg total) by mouth daily.   7 tablet   0   . aspirin 81 MG tablet   Oral   Take 81 mg by mouth daily.         . Calcium-Vitamin D 500-125 MG-UNIT TABS   Oral   Take 2 tablets by mouth daily.           . Cholecalciferol (VITAMIN D) 2000 UNITS CAPS   Oral   Take 1 capsule by mouth daily.           . cyclobenzaprine (FLEXERIL) 10 MG tablet   Oral   Take 5 tablets by mouth at  bedtime as needed for muscle spasms.          Marland Kitchen eletriptan (RELPAX) 40 MG tablet   Oral   One tablet by mouth as needed for migraine headache.  If the headache improves and then returns, dose may be repeated after 2 hours have elapsed since first dose (do not exceed 80 mg per day). may repeat in 2 hours if necessary          . frovatriptan (FROVA) 2.5 MG tablet   Oral   Take 2.5 mg by mouth as needed for migraine. If recurs, may repeat after 2 hours. Max of 3 tabs in 24 hours.         . methadone (DOLOPHINE) 5 MG tablet   Oral   Take 5 mg by mouth every 8 (eight) hours.          . modafinil (PROVIGIL) 200 MG tablet   Oral   Take 1 tablet (200 mg total) by mouth daily as needed.   30 tablet   5   . Multiple Vitamin (MULTIVITAMIN) tablet   Oral   Take 1 tablet by mouth daily.           . naproxen sodium (ALEVE) 220 MG tablet   Oral   Take 220 mg by mouth daily as needed (pain). Take as directed as needed         . Nutritional  Supplements (GLUCOSAMINE COMPLEX) TABS   Oral   Take 1 tablet by mouth daily.           . Omega-3 Fatty Acids (FISH OIL) 1200 MG CAPS   Oral   Take 1 capsule by mouth daily.            There were no vitals taken for this visit. Physical Exam  Nursing note and vitals reviewed. Constitutional: She is oriented to person, place, and time. She appears well-developed and well-nourished.  HENT:  Head: Normocephalic and atraumatic.  Eyes: Conjunctivae and EOM are normal. Pupils are equal, round, and reactive to light.  Neck: Normal range of motion. Neck supple.  Cardiovascular: Normal rate, regular rhythm and normal heart sounds.  Exam reveals no gallop and no friction rub.   No murmur heard. Pulmonary/Chest: Effort normal and breath sounds normal. No respiratory distress. She has no wheezes. She has no rales. She exhibits no tenderness.  Clear to auscultation bilaterally  Abdominal: Soft. Bowel sounds are normal. She exhibits no distension and no mass. There is no tenderness. There is no rebound and no guarding.  Right lower quadrant is moderately tender to palpation, otherwise no focal abdominal tenderness, no right upper quadrant tenderness, or Murphy's sign, no left-sided abdominal tenderness, no fluid wave, or signs of peritonitis  Musculoskeletal: Normal range of motion. She exhibits no edema and no tenderness.  Neurological: She is alert and oriented to person, place, and time.  Skin: Skin is warm and dry.  Psychiatric: She has a normal mood and affect. Her behavior is normal. Judgment and thought content normal.    ED Course  Procedures (including critical care time) Results for orders placed during the hospital encounter of 10/03/13  CBC WITH DIFFERENTIAL      Result Value Range   WBC 7.8  4.0 - 10.5 K/uL   RBC 4.47  3.87 - 5.11 MIL/uL   Hemoglobin 13.8  12.0 - 15.0 g/dL   HCT 40.2  36.0 - 46.0 %   MCV 89.9  78.0 - 100.0 fL   MCH 30.9  26.0 - 34.0 pg   MCHC 34.3  30.0 -  36.0 g/dL   RDW 13.4  11.5 - 15.5 %   Platelets 221  150 - 400 K/uL   Neutrophils Relative % 83 (*) 43 - 77 %   Neutro Abs 6.4  1.7 - 7.7 K/uL   Lymphocytes Relative 11 (*) 12 - 46 %   Lymphs Abs 0.9  0.7 - 4.0 K/uL   Monocytes Relative 5  3 - 12 %   Monocytes Absolute 0.4  0.1 - 1.0 K/uL   Eosinophils Relative 0  0 - 5 %   Eosinophils Absolute 0.0  0.0 - 0.7 K/uL   Basophils Relative 0  0 - 1 %   Basophils Absolute 0.0  0.0 - 0.1 K/uL  COMPREHENSIVE METABOLIC PANEL      Result Value Range   Sodium 143  137 - 147 mEq/L   Potassium 4.0  3.7 - 5.3 mEq/L   Chloride 102  96 - 112 mEq/L   CO2 26  19 - 32 mEq/L   Glucose, Bld 86  70 - 99 mg/dL   BUN 18  6 - 23 mg/dL   Creatinine, Ser 0.70  0.50 - 1.10 mg/dL   Calcium 9.4  8.4 - 10.5 mg/dL   Total Protein 7.2  6.0 - 8.3 g/dL   Albumin 4.0  3.5 - 5.2 g/dL   AST 18  0 - 37 U/L   ALT 17  0 - 35 U/L   Alkaline Phosphatase 95  39 - 117 U/L   Total Bilirubin 0.7  0.3 - 1.2 mg/dL   GFR calc non Af Amer 90 (*) >90 mL/min   GFR calc Af Amer >90  >90 mL/min  LIPASE, BLOOD      Result Value Range   Lipase 38  11 - 59 U/L  URINALYSIS, ROUTINE W REFLEX MICROSCOPIC      Result Value Range   Color, Urine YELLOW  YELLOW   APPearance CLEAR  CLEAR   Specific Gravity, Urine 1.020  1.005 - 1.030   pH 7.0  5.0 - 8.0   Glucose, UA NEGATIVE  NEGATIVE mg/dL   Hgb urine dipstick NEGATIVE  NEGATIVE   Bilirubin Urine NEGATIVE  NEGATIVE   Ketones, ur 15 (*) NEGATIVE mg/dL   Protein, ur NEGATIVE  NEGATIVE mg/dL   Urobilinogen, UA 1.0  0.0 - 1.0 mg/dL   Nitrite NEGATIVE  NEGATIVE   Leukocytes, UA NEGATIVE  NEGATIVE   Ct Abdomen Pelvis W Contrast  10/03/2013   CLINICAL DATA:  Right lower quadrant abdominal pain for 3 weeks. Intermittent abdominal pain.  EXAM: CT ABDOMEN AND PELVIS WITH CONTRAST  TECHNIQUE: Multidetector CT imaging of the abdomen and pelvis was performed using the standard protocol following bolus administration of intravenous contrast.   CONTRAST:  154m OMNIPAQUE IOHEXOL 300 MG/ML  SOLN  COMPARISON:  None.  FINDINGS: Lung Bases: Respiratory motion is present at the lung bases. Scattered areas of dependent atelectasis.  Liver: Septated hepatic cyst is present in the right hepatic lobe adjacent to the right portal vein. On axial imaging, this measures 35 mm x 21 mm.  Spleen:  Normal.  Gallbladder:  Normal.  Common bile duct:  Normal.  Pancreas:  Normal.  Adrenal glands: Tiny gastric diverticulum is present adjacent to the left adrenal gland. No adrenal nodule is present.  Kidneys: Normal enhancement and delayed excretion of contrast. Ureters appear within normal limits.  Stomach:  Normal.  Small bowel:  Normal.  Colon: The appendix is not identified. No right lower quadrant inflammatory changes. Large stool burden is present, particularly in the right colon. Distal colon appears within normal limits.  Pelvic Genitourinary:  Normal.  Bones:  Normal for age.  Lumbar degenerative disc and facet disease.  Vasculature: Normal.  Body Wall: Small fat containing bilateral inguinal hernias.  IMPRESSION: 1. No acute abnormality. 2. Appendix not identified however there are no right lower quadrant inflammatory changes or mass. Large right-sided stool burden. 3. Septated right hepatic lobe cyst.   Electronically Signed   By: Dereck Ligas M.D.   On: 10/03/2013 12:38      EKG Interpretation   None       MDM   1. Abdominal pain     Patient with constant severe right lower quadrant abdominal pain, and nausea and vomiting. No diarrhea, or fevers. Will check basic labs, control pain, and CT scan.  1:18 PM CT is negative for obvious acute appy.  However, the appendix is not specifically identified.  If the patient had appendicitis for 3 weeks I would expect her to look much worse.  No adnexal tenderness.  Doubt ovarian origin.  I will discharge with miralax and colace.  PCP follow up.  Return for new or worsening symptoms.  Discussed with Dr.  Leonides Schanz, who agrees with the plan.    Montine Circle, PA-C 10/03/13 1350

## 2013-10-03 NOTE — ED Notes (Signed)
Report given  To Laddie Aquas, RN

## 2013-10-04 LAB — GC/CHLAMYDIA PROBE AMP
CT PROBE, AMP APTIMA: NEGATIVE
GC Probe RNA: NEGATIVE

## 2013-10-06 ENCOUNTER — Encounter: Payer: Self-pay | Admitting: Internal Medicine

## 2013-10-06 ENCOUNTER — Other Ambulatory Visit (HOSPITAL_COMMUNITY)
Admission: RE | Admit: 2013-10-06 | Discharge: 2013-10-06 | Disposition: A | Discharge status: Home / Self Care | Payer: Medicare Other | Source: Ambulatory Visit | Attending: Internal Medicine | Admitting: Internal Medicine

## 2013-10-06 ENCOUNTER — Ambulatory Visit (INDEPENDENT_AMBULATORY_CARE_PROVIDER_SITE_OTHER): Payer: Medicare Other | Admitting: Internal Medicine

## 2013-10-06 VITALS — BP 130/86 | HR 92 | Temp 98.2°F | Ht 65.5 in | Wt 179.0 lb

## 2013-10-06 DIAGNOSIS — G4733 Obstructive sleep apnea (adult) (pediatric): Secondary | ICD-10-CM | POA: Diagnosis not present

## 2013-10-06 DIAGNOSIS — R5383 Other fatigue: Secondary | ICD-10-CM | POA: Diagnosis not present

## 2013-10-06 DIAGNOSIS — R5381 Other malaise: Secondary | ICD-10-CM | POA: Diagnosis not present

## 2013-10-06 DIAGNOSIS — Z124 Encounter for screening for malignant neoplasm of cervix: Secondary | ICD-10-CM | POA: Insufficient documentation

## 2013-10-06 DIAGNOSIS — IMO0001 Reserved for inherently not codable concepts without codable children: Secondary | ICD-10-CM

## 2013-10-06 DIAGNOSIS — Z0000 Encounter for general adult medical examination without abnormal findings: Secondary | ICD-10-CM | POA: Diagnosis not present

## 2013-10-06 DIAGNOSIS — M797 Fibromyalgia: Secondary | ICD-10-CM

## 2013-10-06 DIAGNOSIS — M1710 Unilateral primary osteoarthritis, unspecified knee: Secondary | ICD-10-CM

## 2013-10-06 DIAGNOSIS — Z8669 Personal history of other diseases of the nervous system and sense organs: Secondary | ICD-10-CM

## 2013-10-06 DIAGNOSIS — K5900 Constipation, unspecified: Secondary | ICD-10-CM

## 2013-10-06 DIAGNOSIS — R1031 Right lower quadrant pain: Secondary | ICD-10-CM

## 2013-10-06 DIAGNOSIS — M170 Bilateral primary osteoarthritis of knee: Secondary | ICD-10-CM

## 2013-10-06 NOTE — Progress Notes (Signed)
Subjective:    Patient ID: Kari Flynn, female    DOB: 02-Dec-1950, 63 y.o.   MRN: [id number]  HPI  64 year old White female for health maintenance exam  and evaluation of medical problems. Dr. Nicholaus Bloom, pain management physician, saw her recently. Has had increased headaches that responded to generic Zyrtec. Mother died of complications of CHF 09-21-22. Went to ED with RLQ abdominal pain Friday Feb 6th and had CT showing increased stool burden. Still does not feel well despite having several loose stools daily. Taking Miralax and Colace. No fever but has had chills.Vomited once the morning she went to ED. Last colonoscopy was April 2008 by Dr. Olevia Perches which was normal without polyps.  Hx fibromyalgia followed by Dr. Arlean Hopping and had recent knee injections  bilateral knee osteoarthritis q 6 months. May have to have knee replacement in the future. Had flu shot at Dr. Candiss Norse office who follows her for sleep apnea. Uses CPAP machine  Long-standing history of migraine headaches and insomnia  Patient is on Provigil, Flexeril, methadone, Relpax and Frova.  Patient is intolerant of Depakote as it has caused hair loss and diarrhea.  Past medical history: D&C 1979, bilateral tubal ligation 1987, bunionectomy 1990, benign right breast biopsy February 1995.  Social history: She used to work as a Equities trader. Married with 2 sons. Quit smoking in 1979. Formerly smoked a pack a day for 10 years. Might have 1 ounce of alcohol weekly.  Family history: Mother with history of hypertension and gout. Father died of multiple myeloma.  Tetanus immunization 2012. Zostavax vaccine 2010. Pap smear 2013.      Review of Systems  Constitutional: Positive for fatigue.  HENT:       Stuffy nose  Eyes:       Nevus in back of eye opthalmology is watching.  Gastrointestinal:       Stool watery since last night. Some nausea.  Endocrine: Negative.   Genitourinary: Negative.     Allergic/Immunologic: Negative.   Neurological:       Migraine headaches for years. One headache every one to 2 weeks.  Hematological: Negative.   Psychiatric/Behavioral: Negative.        Objective:   Physical Exam  Vitals reviewed. Constitutional: She is oriented to person, place, and time. She appears well-developed and well-nourished. No distress.  HENT:  Head: Normocephalic and atraumatic.  Right Ear: External ear normal.  Left Ear: External ear normal.  Mouth/Throat: Oropharynx is clear and moist. No oropharyngeal exudate.  Eyes: Conjunctivae and EOM are normal. Pupils are equal, round, and reactive to light. Right eye exhibits no discharge. Left eye exhibits no discharge. No scleral icterus.  Neck: Neck supple. No JVD present. No thyromegaly present.  Cardiovascular: Normal rate, regular rhythm, normal heart sounds and intact distal pulses.   No murmur heard. Abdominal: Soft. Bowel sounds are normal. She exhibits no mass. There is tenderness. There is no rebound and no guarding.  Genitourinary:  Stool noted in rectal vault  Musculoskeletal: Normal range of motion. She exhibits no edema.  Lymphadenopathy:    She has no cervical adenopathy.  Neurological: She is alert and oriented to person, place, and time. She has normal reflexes. No cranial nerve deficit. Coordination normal.  Skin: Skin is warm and dry. No rash noted. She is not diaphoretic.  Psychiatric: She has a normal mood and affect. Her behavior is normal. Judgment and thought content normal.  Assessment & Plan:   Right lower quadrant abdominal pain-? Chronic constipation secondary to pain medication. Suggest gastroenterology evaluation. Spent 25 minutes evaluating this today.  History of migraine headaches  History of sleep apnea  Fibromyalgia syndrome syndrome   Osteoarthritis both knees  Fatigue  Plan: Return in one year or as needed. GI consultation will be obtained.   Subjective:    Patient presents for Medicare Annual/Subsequent preventive examination.   Review Past Medical/Family/Social: see above   Risk Factors  Current exercise habits: walk 2/3 of a mile 3 times a week Dietary issues discussed: low fat low carb  Cardiac risk factors: Mother with CHF and aortic stenosis  Depression Screen  (Note: if answer to either of the following is "Yes", a more complete depression screening is indicated)   Over the past two weeks, have you felt down, depressed or hopeless? No  Over the past two weeks, have you felt little interest or pleasure in doing things? No Have you lost interest or pleasure in daily life? No Do you often feel hopeless? No Do you cry easily over simple problems? No   Activities of Daily Living  In your present state of health, do you have any difficulty performing the following activities?:   Driving? No  Managing money? No  Feeding yourself? No  Getting from bed to chair? No  Climbing a flight of stairs? No  Preparing food and eating?: No  Bathing or showering? No  Getting dressed: No  Getting to the toilet? No  Using the toilet:No  Moving around from place to place: No  In the past year have you fallen or had a near fall?:No  Are you sexually active? yes Do you have more than one partner? No   Hearing Difficulties: No  Do you often ask people to speak up or repeat themselves? No  Do you experience ringing or noises in your ears? No  Do you have difficulty understanding soft or whispered voices? No  Do you feel that you have a problem with memory? No Do you often misplace items? No    Home Safety:  Do you have a smoke alarm at your residence? Yes Do you have grab bars in the bathroom? no Do you have throw rugs in your house? Yes but have rubber backs   Cognitive Testing  Alert? Yes Normal Appearance?Yes  Oriented to person? Yes Place? Yes  Time? Yes  Recall of three objects? Yes  Can perform simple calculations? Yes   Displays appropriate judgment?Yes  Can read the correct time from a watch face?Yes   List the Names of Other Physician/Practitioners you currently use:  See referral list for the physicians patient is currently seeing.  Dr. Annamaria Boots, pulmonologist Dr. Hardin Negus Dr. Estanislado Pandy Dr. Satira Sark- watching nevus back of eye   Review of Systems: See above  Objective:     General appearance: Appears stated age Head: Normocephalic, without obvious abnormality, atraumatic  Eyes: conj clear, EOMi PEERLA  Ears: normal TM's and external ear canals both ears  Nose: Nares normal. Septum midline. Mucosa normal. No drainage or sinus tenderness.  Throat: lips, mucosa, and tongue normal; teeth and gums normal  Neck: no adenopathy, no carotid bruit, no JVD, supple, symmetrical, trachea midline and thyroid not enlarged, symmetric, no tenderness/mass/nodules  No CVA tenderness.  Lungs: clear to auscultation bilaterally  Breasts: normal appearance, no masses or tenderness. Heart: regular rate and rhythm, S1, S2 normal, no murmur, click, rub or gallop  Abdomen: soft, non-tender; bowel sounds  normal; no masses, no organomegaly  Musculoskeletal: ROM normal in all joints, no crepitus, no deformity, Normal muscle strengthen. Back  is symmetric, no curvature. Skin: Skin color, texture, turgor normal. No rashes or lesions  Lymph nodes: Cervical, supraclavicular, and axillary nodes normal.  Neurologic: CN 2 -12 Normal, Normal symmetric reflexes. Normal coordination and gait  Psych: Alert & Oriented x 3, Mood appear stable.    Assessment:    Annual wellness medicare exam   Plan:    During the course of the visit the patient was educated and counseled about appropriate screening and preventive services including:  Mammogram Colonoscopy 2008 Annual flu vaccine      Patient Instructions (the written plan) was given to the patient.  Medicare Attestation  I have personally reviewed:  The patient's medical  and social history  Their use of alcohol, tobacco or illicit drugs  Their current medications and supplements  The patient's functional ability including ADLs,fall risks, home safety risks, cognitive, and hearing and visual impairment  Diet and physical activities  Evidence for depression or mood disorders  The patient's weight, height, BMI, and visual acuity have been recorded in the chart. I have made referrals, counseling, and provided education to the patient based on review of the above and I have provided the patient with a written personalized care plan for preventive services.

## 2013-10-09 ENCOUNTER — Encounter: Payer: Self-pay | Admitting: Gastroenterology

## 2013-10-14 ENCOUNTER — Ambulatory Visit: Payer: Medicare Other | Admitting: Gastroenterology

## 2013-10-16 ENCOUNTER — Encounter: Payer: Self-pay | Admitting: Gastroenterology

## 2013-10-16 ENCOUNTER — Ambulatory Visit (INDEPENDENT_AMBULATORY_CARE_PROVIDER_SITE_OTHER): Payer: Medicare Other | Admitting: Gastroenterology

## 2013-10-16 VITALS — BP 118/68 | HR 78 | Ht 65.0 in | Wt 179.0 lb

## 2013-10-16 DIAGNOSIS — K5900 Constipation, unspecified: Secondary | ICD-10-CM

## 2013-10-16 DIAGNOSIS — R194 Change in bowel habit: Secondary | ICD-10-CM

## 2013-10-16 DIAGNOSIS — R198 Other specified symptoms and signs involving the digestive system and abdomen: Secondary | ICD-10-CM

## 2013-10-16 MED ORDER — PEG-KCL-NACL-NASULF-NA ASC-C 100 G PO SOLR: 1.0000 | Freq: Once | ORAL | Status: DC

## 2013-10-16 NOTE — Progress Notes (Signed)
10/16/2013 Kari Flynn [id number] [birth year]   HISTORY OF PRESENT ILLNESS:  This is a pleasant 63 year old female who is known to Dr. Olevia Perches for previous colonoscopy in April 2008. At that time her colonoscopy was normal and it was recommended that she have a repeat procedure in 10 years from that time. She presents to our office today for followup after an ER visit earlier this month.  She was referred by here by her PCP, Dr. Renold Genta.  The patient states that in January she had a three-week course of right lower quadrant abdominal pain that would be present upon awakening in the morning and would last approximately 10 minutes at a time. It did not seem to be associated with anything. Then on February 6 she had the same pains in the right lower quadrant, however, there were very severe and had her doubled over. These pains lasted 2 hours and she had some vomiting with it as well. She presented to the emergency department and a CT scan revealed no issues with the appendix, but she did have a large right-sided stool burden. She was given pain medication antiemetics and told to begin taking MiraLax daily at home. She did take that for course of a couple of weeks and has been doing better so she has now placed herself only on Colace stool softeners. She does take several medications including methadone for her fibromyalgia that will lead to constipation. She's had some constipation on and off over the years but usually has bowel movemenst of some sort on a fairly regular basis. She does admit that her stools were somewhat changed in shape as well recently and were described as looking "rope-like". She denies any rectal bleeding. There's been no weight loss. During that same hospital visit a CBC, CMP, lipase, and urinalysis were unremarkable.    Past Medical History  Diagnosis Date  . Dyspnea   . Migraine, unspecified, without mention of intractable migraine without mention of status migrainosus   . Myalgia  and myositis, unspecified   . Obstructive sleep apnea (adult) (pediatric)   . Arthritis    Past Surgical History  Procedure Laterality Date  . Bunionectomy      x2  . Blepharoplasty    . Breast biopsy      B9    reports that she quit smoking about 36 years ago. Her smoking use included Cigarettes. She has a 10 pack-year smoking history. She does not have any smokeless tobacco history on file. She reports that she drinks about 1.2 ounces of alcohol per week. She reports that she does not use illicit drugs. family history includes Arthritis in her mother and another family member; Diabetes in her brother and paternal grandfather; Diverticulitis in her mother; Hyperlipidemia in her mother; Hypertension in her mother; Melanoma in her mother; Multiple myeloma in her father; Other in her mother; Sarcoidosis in an other family member. Allergies  Allergen Reactions  . Divalproex Sodium Other (See Comments)    Severe diarrhea & extreme hair loss      Outpatient Encounter Prescriptions as of 10/16/2013  Medication Sig  . aspirin 81 MG tablet Take 81 mg by mouth daily.  . Calcium-Vitamin D 500-125 MG-UNIT TABS Take 2 tablets by mouth daily.    . Cholecalciferol (VITAMIN D) 2000 UNITS CAPS Take 1 capsule by mouth daily.    . cyclobenzaprine (FLEXERIL) 10 MG tablet Take 5 tablets by mouth at bedtime as needed for muscle spasms.   Marland Kitchen docusate sodium (COLACE) 100 MG  capsule Take 1 capsule (100 mg total) by mouth every 12 (twelve) hours.  Marland Kitchen eletriptan (RELPAX) 40 MG tablet One tablet by mouth as needed for migraine headache.  If the headache improves and then returns, dose may be repeated after 2 hours have elapsed since first dose (do not exceed 80 mg per day). may repeat in 2 hours if necessary   . frovatriptan (FROVA) 2.5 MG tablet Take 2.5 mg by mouth as needed for migraine. If recurs, may repeat after 2 hours. Max of 3 tabs in 24 hours.  . methadone (DOLOPHINE) 5 MG tablet Take 5 mg by mouth every 8  (eight) hours.   . modafinil (PROVIGIL) 200 MG tablet Take 1 tablet (200 mg total) by mouth daily as needed.  . modafinil (PROVIGIL) 200 MG tablet Take 200 mg by mouth daily. 1/2 tablet daily  . Multiple Vitamin (MULTIVITAMIN) tablet Take 1 tablet by mouth daily.    . naproxen sodium (ALEVE) 220 MG tablet Take 220 mg by mouth daily as needed (pain). Take as directed as needed  . Nutritional Supplements (GLUCOSAMINE COMPLEX) TABS Take 1 tablet by mouth daily.    . Omega-3 Fatty Acids (FISH OIL) 1200 MG CAPS Take 1 capsule by mouth daily.    . polyethylene glycol powder (GLYCOLAX/MIRALAX) powder Take 17 g by mouth 2 (two) times daily. Until daily soft stools  OTC  . peg 3350 powder (MOVIPREP) 100 G SOLR Take 1 kit (200 g total) by mouth once.     REVIEW OF SYSTEMS  : All other systems reviewed and negative except where noted in the History of Present Illness.   PHYSICAL EXAM: BP 118/68  Pulse 78  Ht _0  (1.651 m)  Wt 179 lb (81.194 kg)  BMI 29.79 kg/m2 General: Well developed white female in no acute distress Head: Normocephalic and atraumatic Eyes:  Sclerae anicteric, conjunctiva pink. Ears: Normal auditory acuity.  Lungs: Clear throughout to auscultation Heart: Regular rate and rhythm Abdomen: Soft, non-distended.  Normal bowel sounds.  Mild lower abdominal TTP without R/R/G. Rectal: Deferred.  Will be done at the time of colonoscopy. Musculoskeletal: Symmetrical with no gross deformities  Skin: No lesions on visible extremities Extremities: No edema  Neurological: Alert oriented x 4, grossly non-focal. Psychological:  Alert and cooperative. Normal mood and affect  ASSESSMENT AND PLAN: -Change in bowel habits with worsened constipation:  Last colonoscopy 11/2006.  She is on several medications that will cause constipation, however, this episode of constipation was much exaggerated than anything that she's experienced in the past.  Had a lot of RLQ abdominal pain and large stool  burden in right colon on CT scan.  Will schedule repeat colonoscopy to rule out right colon pathology although I suspect that it is likely all a motility issue due to her medications.  The risks, benefits, and alternatives were discussed with the patient and she consents to proceed.  Needs high fiber diet with a lot of water/liquids.  I also recommended that she continue the Miralax on a regular basis as well.

## 2013-10-16 NOTE — Patient Instructions (Signed)

## 2013-10-17 NOTE — Progress Notes (Signed)
Reviewed and agree.

## 2013-10-22 ENCOUNTER — Encounter: Payer: Self-pay | Admitting: Internal Medicine

## 2013-10-22 ENCOUNTER — Ambulatory Visit (AMBULATORY_SURGERY_CENTER): Payer: Medicare Other | Admitting: Internal Medicine

## 2013-10-22 VITALS — BP 148/72 | HR 69 | Temp 96.4°F | Resp 21 | Ht 65.0 in | Wt 179.0 lb

## 2013-10-22 DIAGNOSIS — R1031 Right lower quadrant pain: Secondary | ICD-10-CM | POA: Diagnosis not present

## 2013-10-22 DIAGNOSIS — G4733 Obstructive sleep apnea (adult) (pediatric): Secondary | ICD-10-CM | POA: Diagnosis not present

## 2013-10-22 DIAGNOSIS — R198 Other specified symptoms and signs involving the digestive system and abdomen: Secondary | ICD-10-CM

## 2013-10-22 DIAGNOSIS — F341 Dysthymic disorder: Secondary | ICD-10-CM | POA: Diagnosis not present

## 2013-10-22 DIAGNOSIS — E669 Obesity, unspecified: Secondary | ICD-10-CM | POA: Diagnosis not present

## 2013-10-22 DIAGNOSIS — IMO0001 Reserved for inherently not codable concepts without codable children: Secondary | ICD-10-CM | POA: Diagnosis not present

## 2013-10-22 DIAGNOSIS — R194 Change in bowel habit: Secondary | ICD-10-CM

## 2013-10-22 MED ORDER — SODIUM CHLORIDE 0.9 % IV SOLN: 500.0000 mL | INTRAVENOUS | Status: DC

## 2013-10-22 NOTE — Progress Notes (Signed)
Report to pacu rn, vss, bbs=clear 

## 2013-10-22 NOTE — Patient Instructions (Signed)
YOU HAD AN ENDOSCOPIC PROCEDURE TODAY AT THE [facility]: Refer to the procedure report that was given to you for any specific questions about what was found during the examination.  If the procedure report does not answer your questions, please call your gastroenterologist to clarify.  If you requested that your care partner not be given the details of your procedure findings, then the procedure report has been included in a sealed envelope for you to review at your convenience later.  YOU SHOULD EXPECT: Some feelings of bloating in the abdomen. Passage of more gas than usual.  Walking can help get rid of the air that was put into your GI tract during the procedure and reduce the bloating. If you had a lower endoscopy (such as a colonoscopy or flexible sigmoidoscopy) you may notice spotting of blood in your stool or on the toilet paper. If you underwent a bowel prep for your procedure, then you may not have a normal bowel movement for a few days.  DIET: Your first meal following the procedure should be a light meal and then it is ok to progress to your normal diet.  A half-sandwich or bowl of soup is an example of a good first meal.  Heavy or fried foods are harder to digest and may make you feel nauseous or bloated.  Likewise meals heavy in dairy and vegetables can cause extra gas to form and this can also increase the bloating.  Drink plenty of fluids but you should avoid alcoholic beverages for 24 hours.  ACTIVITY: Your care partner should take you home directly after the procedure.  You should plan to take it easy, moving slowly for the rest of the day.  You can resume normal activity the day after the procedure however you should NOT DRIVE or use heavy machinery for 24 hours (because of the sedation medicines used during the test).    SYMPTOMS TO REPORT IMMEDIATELY: A gastroenterologist can be reached at any hour.  During normal business hours, 8:30 AM to 5:00 PM Monday through Friday,  call [phone].  After hours and on weekends, please call the GI answering service at [phone] who will take a message and have the physician on call contact you.   Following lower endoscopy (colonoscopy or flexible sigmoidoscopy):  Excessive amounts of blood in the stool  Significant tenderness or worsening of abdominal pains  Swelling of the abdomen that is new, acute  Fever of 100F or higher    FOLLOW UP: If any biopsies were taken you will be contacted by phone or by letter within the next 1-3 weeks.  Call your gastroenterologist if you have not heard about the biopsies in 3 weeks.  Our staff will call the home number listed on your records the next business day following your procedure to check on you and address any questions or concerns that you may have at that time regarding the information given to you following your procedure. This is a courtesy call and so if there is no answer at the home number and we have not heard from you through the emergency physician on call, we will assume that you have returned to your regular daily activities without incident.  SIGNATURES/CONFIDENTIALITY: You and/or your care partner have signed paperwork which will be entered into your electronic medical record.  These signatures attest to the fact that that the information above on your After Visit Summary has been reviewed and is understood.  Full responsibility of the confidentiality   of this discharge information lies with you and/or your care-partner.  Normal colonoscopy.  High fiber diet information given.

## 2013-10-22 NOTE — Op Note (Signed)
[facility]  [address] [place], [id number]   COLONOSCOPY PROCEDURE REPORT  PATIENT: Kari Flynn, Kari Flynn  MR#: [id number] BIRTHDATE: 1951/01/18 , 54  yrs. old GENDER: Female ENDOSCOPIST: Lafayette Dragon, MD REFERRED FB:[id number] Parke Simmers, M.D. PROCEDURE DATE:  10/22/2013 PROCEDURE:   Colonoscopy, diagnostic First Screening Colonoscopy - Avg.  risk and is 50 yrs.  old or older - No.  Prior Negative Screening - Now for repeat screening. N/A  History of Adenoma - Now for follow-up colonoscopy & has been > or = to 3 yrs.  N/A  Polyps Removed Today? No.  Recommend repeat exam, <10 yrs? No. ASA CLASS:   Class II INDICATIONS:abdominal pain in the lower right quadrant and A.  acute episodes of right lower quadrant abdominal pain was evaluated in the emergency room.  CT scan of the abdomen was normal.  Prior colonoscopy in April 2000 and was normal.  History of fibromyalgia. Patient on methadone. MEDICATIONS: MAC sedation, administered by CRNA and Propofol (Diprivan) 280 mg IV  DESCRIPTION OF PROCEDURE:   After the risks benefits and alternatives of the procedure were thoroughly explained, informed consent was obtained.  A digital rectal exam revealed no abnormalities of the rectum.   The LB CF-H180AL Loaner [id number] and LB PFC-H190 [id number]  endoscope was introduced through the anus and advanced to the cecum, which was identified by both the appendix and ileocecal valve. No adverse events experienced.   The quality of the prep was excellent, using MoviPrep  The instrument was then slowly withdrawn as the colon was fully examined.      COLON FINDINGS: A normal appearing cecum, ileocecal valve, and appendiceal orifice were identified.  The ascending, hepatic flexure, transverse, splenic flexure, descending, sigmoid colon and rectum appeared unremarkable.  No polyps or cancers were seen. Retroflexed views revealed no abnormalities. The time to cecum=3 minutes 25 seconds.   Withdrawal time=7 minutes 38 seconds.  The scope was withdrawn and the procedure completed. COMPLICATIONS: There were no complications.  ENDOSCOPIC IMPRESSION: Normal colon ,nothing to account for right lower quadrant abdominal pain,  RECOMMENDATIONS: high fiber diet If the pain recurs consider barium enema to rule out mobile cecum   eSigned:  Lafayette Dragon, MD 10/22/2013 9:12 AM   cc:

## 2013-10-24 ENCOUNTER — Telehealth: Payer: Self-pay | Admitting: *Deleted

## 2013-10-24 ENCOUNTER — Encounter: Payer: Self-pay | Admitting: *Deleted

## 2013-10-24 ENCOUNTER — Other Ambulatory Visit: Payer: Self-pay | Admitting: *Deleted

## 2013-10-24 ENCOUNTER — Ambulatory Visit (INDEPENDENT_AMBULATORY_CARE_PROVIDER_SITE_OTHER)
Admission: RE | Admit: 2013-10-24 | Discharge: 2013-10-24 | Disposition: A | Discharge status: Home / Self Care | Payer: Medicare Other | Source: Ambulatory Visit | Attending: Internal Medicine | Admitting: Internal Medicine

## 2013-10-24 DIAGNOSIS — R109 Unspecified abdominal pain: Secondary | ICD-10-CM

## 2013-10-24 MED ORDER — HYOSCYAMINE SULFATE 0.125 MG SL SUBL: 0.1250 mg | SUBLINGUAL_TABLET | SUBLINGUAL | Status: DC | PRN

## 2013-10-24 NOTE — Telephone Encounter (Signed)
  Follow up Call-  Call back number 10/22/2013  Post procedure Call Back phone  # [phone] hm  Permission to leave phone message Yes     Patient questions:  Do you have a fever, pain , or abdominal swelling? yes Pain Score  4 *  Have you tolerated food without any problems? yes  Have you been able to return to your normal activities? yes  Do you have any questions about your discharge instructions: Diet   no Medications  no Follow up visit  no  Do you have questions or concerns about your Care? yes  Actions: * If pain score is 4 or above: No action needed, pain <4  Colonoscopy 2/25.  States she is having mid abdominal pain around umbilicus and down.  Rates pain as a "4".  States it kept her awake until about 3AM.  She's afebrile, eating, denies nausea..  States she is distended, passing gas, And has not had a BM post colonoscopy. Please advise.

## 2013-10-24 NOTE — Telephone Encounter (Signed)
Encounter created in error

## 2013-10-24 NOTE — Telephone Encounter (Signed)
1315 spoke with Dr. Olevia Perches.  KUB to be ordered.  Rx. For Levsin 0.125mg . To be sent to pharmacy.  Pt. Called and advised about KUB and new rx. To get in touch with Dr. Nichola Sizer office if not feeling better by end of next week. Pt. Verbalized understanding.

## 2013-10-24 NOTE — Telephone Encounter (Signed)
Pt. States that she has discomfort in area of umbilicus and below.  Feels that she is somewhat distended.  Afebrile.  Eating and drinking without any problems.  States She is passing gas.  Didn't sleep well last night secondary to abdominal pain.  Rates the discomfort as 4-5.

## 2013-10-29 NOTE — Telephone Encounter (Signed)
Pt. Advised that KUB to be ordered, and RX for Levsin sent to pharmacy

## 2013-10-29 NOTE — Telephone Encounter (Signed)
Entered in error

## 2013-10-31 MED ORDER — HYOSCYAMINE SULFATE 0.125 MG SL SUBL: 0.1250 mg | SUBLINGUAL_TABLET | SUBLINGUAL | Status: DC | PRN

## 2013-11-26 DIAGNOSIS — M1710 Unilateral primary osteoarthritis, unspecified knee: Secondary | ICD-10-CM | POA: Diagnosis not present

## 2013-11-26 DIAGNOSIS — IMO0001 Reserved for inherently not codable concepts without codable children: Secondary | ICD-10-CM | POA: Diagnosis not present

## 2013-11-26 DIAGNOSIS — M47817 Spondylosis without myelopathy or radiculopathy, lumbosacral region: Secondary | ICD-10-CM | POA: Diagnosis not present

## 2013-11-26 DIAGNOSIS — G43809 Other migraine, not intractable, without status migrainosus: Secondary | ICD-10-CM | POA: Diagnosis not present

## 2013-11-26 DIAGNOSIS — IMO0002 Reserved for concepts with insufficient information to code with codable children: Secondary | ICD-10-CM | POA: Diagnosis not present

## 2013-12-09 DIAGNOSIS — D3130 Benign neoplasm of unspecified choroid: Secondary | ICD-10-CM | POA: Diagnosis not present

## 2013-12-09 DIAGNOSIS — H21239 Degeneration of iris (pigmentary), unspecified eye: Secondary | ICD-10-CM | POA: Diagnosis not present

## 2013-12-09 DIAGNOSIS — H2510 Age-related nuclear cataract, unspecified eye: Secondary | ICD-10-CM | POA: Diagnosis not present

## 2014-01-02 DIAGNOSIS — L255 Unspecified contact dermatitis due to plants, except food: Secondary | ICD-10-CM | POA: Diagnosis not present

## 2014-01-23 ENCOUNTER — Emergency Department (HOSPITAL_COMMUNITY)
Admission: EM | Admit: 2014-01-23 | Discharge: 2014-01-23 | Disposition: A | Discharge status: Home / Self Care | Payer: Medicare Other | Attending: Emergency Medicine | Admitting: Emergency Medicine

## 2014-01-23 ENCOUNTER — Encounter (HOSPITAL_COMMUNITY): Payer: Self-pay | Admitting: Emergency Medicine

## 2014-01-23 DIAGNOSIS — S81009A Unspecified open wound, unspecified knee, initial encounter: Secondary | ICD-10-CM | POA: Diagnosis not present

## 2014-01-23 DIAGNOSIS — Z79899 Other long term (current) drug therapy: Secondary | ICD-10-CM | POA: Diagnosis not present

## 2014-01-23 DIAGNOSIS — S81811A Laceration without foreign body, right lower leg, initial encounter: Secondary | ICD-10-CM

## 2014-01-23 DIAGNOSIS — Z87891 Personal history of nicotine dependence: Secondary | ICD-10-CM | POA: Diagnosis not present

## 2014-01-23 DIAGNOSIS — Z7982 Long term (current) use of aspirin: Secondary | ICD-10-CM | POA: Diagnosis not present

## 2014-01-23 DIAGNOSIS — Y9289 Other specified places as the place of occurrence of the external cause: Secondary | ICD-10-CM | POA: Insufficient documentation

## 2014-01-23 DIAGNOSIS — M129 Arthropathy, unspecified: Secondary | ICD-10-CM | POA: Diagnosis not present

## 2014-01-23 DIAGNOSIS — G43909 Migraine, unspecified, not intractable, without status migrainosus: Secondary | ICD-10-CM | POA: Diagnosis not present

## 2014-01-23 DIAGNOSIS — S91009A Unspecified open wound, unspecified ankle, initial encounter: Principal | ICD-10-CM

## 2014-01-23 DIAGNOSIS — W208XXA Other cause of strike by thrown, projected or falling object, initial encounter: Secondary | ICD-10-CM | POA: Insufficient documentation

## 2014-01-23 DIAGNOSIS — Y9389 Activity, other specified: Secondary | ICD-10-CM | POA: Insufficient documentation

## 2014-01-23 DIAGNOSIS — S81809A Unspecified open wound, unspecified lower leg, initial encounter: Principal | ICD-10-CM

## 2014-01-23 MED ORDER — CEPHALEXIN 500 MG PO CAPS: 500.0000 mg | ORAL_CAPSULE | Freq: Four times a day (QID) | ORAL | Status: DC

## 2014-01-23 NOTE — ED Notes (Signed)
NP at bedside suturing.

## 2014-01-23 NOTE — Discharge Instructions (Signed)
Laceration Care, Adult °A laceration is a cut or lesion that goes through all layers of the skin and into the tissue just beneath the skin. °TREATMENT  °Some lacerations may not require closure. Some lacerations may not be able to be closed due to an increased risk of infection. It is important to see your caregiver as soon as possible after an injury to minimize the risk of infection and maximize the opportunity for successful closure. °If closure is appropriate, pain medicines may be given, if needed. The wound will be cleaned to help prevent infection. Your caregiver will use stitches (sutures), staples, wound glue (adhesive), or skin adhesive strips to repair the laceration. These tools bring the skin edges together to allow for faster healing and a better cosmetic outcome. However, all wounds will heal with a scar. Once the wound has healed, scarring can be minimized by covering the wound with sunscreen during the day for 1 full year. °HOME CARE INSTRUCTIONS  °For sutures or staples: °· Keep the wound clean and dry. °· If you were given a bandage (dressing), you should change it at least once a day. Also, change the dressing if it becomes wet or dirty, or as directed by your caregiver. °· Wash the wound with soap and water 2 times a day. Rinse the wound off with water to remove all soap. Pat the wound dry with a clean towel. °· After cleaning, apply a thin layer of the antibiotic ointment as recommended by your caregiver. This will help prevent infection and keep the dressing from sticking. °· You may shower as usual after the first 24 hours. Do not soak the wound in water until the sutures are removed. °· Only take over-the-counter or prescription medicines for pain, discomfort, or fever as directed by your caregiver. °· Get your sutures or staples removed as directed by your caregiver. °For skin adhesive strips: °· Keep the wound clean and dry. °· Do not get the skin adhesive strips wet. You may bathe  carefully, using caution to keep the wound dry. °· If the wound gets wet, pat it dry with a clean towel. °· Skin adhesive strips will fall off on their own. You may trim the strips as the wound heals. Do not remove skin adhesive strips that are still stuck to the wound. They will fall off in time. °For wound adhesive: °· You may briefly wet your wound in the shower or bath. Do not soak or scrub the wound. Do not swim. Avoid periods of heavy perspiration until the skin adhesive has fallen off on its own. After showering or bathing, gently pat the wound dry with a clean towel. °· Do not apply liquid medicine, cream medicine, or ointment medicine to your wound while the skin adhesive is in place. This may loosen the film before your wound is healed. °· If a dressing is placed over the wound, be careful not to apply tape directly over the skin adhesive. This may cause the adhesive to be pulled off before the wound is healed. °· Avoid prolonged exposure to sunlight or tanning lamps while the skin adhesive is in place. Exposure to ultraviolet light in the first year will darken the scar. °· The skin adhesive will usually remain in place for 5 to 10 days, then naturally fall off the skin. Do not pick at the adhesive film. °You may need a tetanus shot if: °· You cannot remember when you had your last tetanus shot. °· You have never had a tetanus   shot. If you get a tetanus shot, your arm may swell, get red, and feel warm to the touch. This is common and not a problem. If you need a tetanus shot and you choose not to have one, there is a rare chance of getting tetanus. Sickness from tetanus can be serious. SEEK MEDICAL CARE IF:   You have redness, swelling, or increasing pain in the wound.  You see a red line that goes away from the wound.  You have yellowish-white fluid (pus) coming from the wound.  You have a fever.  You notice a bad smell coming from the wound or dressing.  Your wound breaks open before or  after sutures have been removed.  You notice something coming out of the wound such as wood or glass.  Your wound is on your hand or foot and you cannot move a finger or toe. SEEK IMMEDIATE MEDICAL CARE IF:   Your pain is not controlled with prescribed medicine.  You have severe swelling around the wound causing pain and numbness or a change in color in your arm, hand, leg, or foot.  Your wound splits open and starts bleeding.  You have worsening numbness, weakness, or loss of function of any joint around or beyond the wound.  You develop painful lumps near the wound or on the skin anywhere on your body. MAKE SURE YOU:   Understand these instructions.  Will watch your condition.  Will get help right away if you are not doing well or get worse. Document Released: 08/14/2005 Document Revised: 11/06/2011 Document Reviewed: 02/07/2011 [facility] Patient Information 2014 [place], [place].  SUTURE REMOVAL IN 7 DAYS.  FOLLOW-UP WITH YOUR PCP OR RETURN TO THE ED IF INDICATION OF INFECTION PRIOR TO SUTURE REMOVAL.

## 2014-01-23 NOTE — ED Notes (Signed)
Pt reports being scrapped by a limb to right lower leg while working in the yard today. Bleeding controlled with pressure bandage. Pt takes daily aspirin. Pt denies pain. Denies dizziness/weakness. AO x4.

## 2014-01-23 NOTE — ED Notes (Signed)
NP done suturing. 

## 2014-01-23 NOTE — ED Provider Notes (Signed)
CSN: [id number]     Arrival date & time 01/23/14  1513 History  This chart was scribed for non-physician practitioner Etta Quill, NP working with Ezequiel Essex, MD by Anastasia Pall, ED scribe. This patient was seen in room TR07C/TR07C and the patient's care was started at 4:47 PM.    Chief Complaint  Patient presents with  . Extremity Laceration   (Consider location/radiation/quality/duration/timing/severity/associated sxs/prior Treatment) Patient is a 63 y.o. female presenting with skin laceration. The history is provided by the patient. No language interpreter was used.  Laceration Location:  Leg Leg laceration location:  R lower leg Depth:  Cutaneous Bleeding: controlled   Injury mechanism: branch. Pain details:    Severity:  No pain Foreign body present:  No foreign bodies Tetanus status:  Up to date  HPI Comments: Kari Flynn is a 63 y.o. female who presents to the Emergency Department complaining of a moderate superficial laceration over her right LE, onset earlier this afternoon while she was doing yard work and a branch fell off a pile striking her leg. She reports wearing jeans, and states they did not tear with impact. Bleeding is controlled with bandage. She reports taking baby Aspirin daily. She denies pain, and any other associated symptoms.   PCP - Elby Showers, MD  Past Medical History  Diagnosis Date  . Dyspnea   . Migraine, unspecified, without mention of intractable migraine without mention of status migrainosus   . Myalgia and myositis, unspecified   . Obstructive sleep apnea (adult) (pediatric)   . Arthritis    Past Surgical History  Procedure Laterality Date  . Bunionectomy      x2  . Blepharoplasty    . Breast biopsy      B9  . Wisdom tooth extraction     Family History  Problem Relation Age of Onset  . Hypertension Mother   . Arthritis Mother   . Hyperlipidemia Mother   . Melanoma Mother   . Diverticulitis Mother   . Multiple myeloma  Father   . Arthritis      sibling  . Sarcoidosis      sibling-also has NIDDM  . Diabetes Brother   . Sarcoidosis Brother   . Diabetes Paternal Grandfather   . Colon cancer Neg Hx   . Esophageal cancer Neg Hx   . Rectal cancer Neg Hx   . Stomach cancer Neg Hx    History  Substance Use Topics  . Smoking status: Former Smoker -- 1.00 packs/day for 10 years    Types: Cigarettes    Quit date: 08/28/1977  . Smokeless tobacco: Not on file  . Alcohol Use: 0.6 oz/week    1 Glasses of wine per week     Comment: monthly   OB History   Grav Para Term Preterm Abortions TAB SAB Ect Mult Living                 Review of Systems  Musculoskeletal: Negative for arthralgias and myalgias.  Skin: Positive for wound (laceration to right LE).  Neurological: Negative for dizziness and weakness.  All other systems reviewed and are negative.  Allergies  Divalproex sodium  Home Medications   Prior to Admission medications   Medication Sig Start Date End Date Taking? Authorizing Provider  aspirin 81 MG tablet Take 81 mg by mouth daily.   Yes Historical Provider, MD  Calcium-Vitamin D 500-125 MG-UNIT TABS Take 2 tablets by mouth daily.     Yes Historical Provider, MD  Cholecalciferol (  VITAMIN D) 2000 UNITS CAPS Take 1 capsule by mouth daily.     Yes Historical Provider, MD  cyclobenzaprine (FLEXERIL) 10 MG tablet Take 5 tablets by mouth at bedtime as needed for muscle spasms.  06/03/12  Yes Historical Provider, MD  docusate sodium (COLACE) 100 MG capsule Take 1 capsule (100 mg total) by mouth every 12 (twelve) hours. 10/03/13  Yes Montine Circle, PA-C  eletriptan (RELPAX) 40 MG tablet Take 40 mg by mouth as needed for migraine. may repeat in 2 hours if necessary   Yes Historical Provider, MD  frovatriptan (FROVA) 2.5 MG tablet Take 2.5 mg by mouth as needed for migraine. If recurs, may repeat after 2 hours. Max of 3 tabs in 24 hours.   Yes Historical Provider, MD  hyoscyamine (LEVSIN SL) 0.125 MG SL  tablet Place 1 tablet (0.125 mg total) under the tongue every 4 (four) hours as needed for cramping.   Yes Lafayette Dragon, MD  methadone (DOLOPHINE) 5 MG tablet Take 5 mg by mouth every 8 (eight) hours.    Yes Historical Provider, MD  modafinil (PROVIGIL) 200 MG tablet Take 200 mg by mouth daily as needed. For sleep 09/16/13 09/16/14 Yes Deneise Lever, MD  Multiple Vitamin (MULTIVITAMIN) tablet Take 1 tablet by mouth daily.     Yes Historical Provider, MD  naproxen sodium (ALEVE) 220 MG tablet Take 220 mg by mouth daily as needed (pain). Take as directed as needed   Yes Historical Provider, MD  Nutritional Supplements (GLUCOSAMINE COMPLEX) TABS Take 1 tablet by mouth daily.     Yes Historical Provider, MD  Omega-3 Fatty Acids (FISH OIL) 1200 MG CAPS Take 1 capsule by mouth daily.     Yes Historical Provider, MD  polyethylene glycol powder (GLYCOLAX/MIRALAX) powder Take 17 g by mouth once a week. Until daily soft stools. Take on Mondays and thursdays per patient   OTC 10/03/13  Yes Montine Circle, PA-C   BP 139/75  Pulse 74  Temp(Src) 98.3 F (36.8 C) (Oral)  Resp 20  Ht _0  (1.676 m)  Wt 175 lb (79.379 kg)  BMI 28.26 kg/m2  SpO2 97% Physical Exam  Nursing note and vitals reviewed. Constitutional: She is oriented to person, place, and time. She appears well-developed and well-nourished. No distress.  HENT:  Head: Normocephalic and atraumatic.  Eyes: EOM are normal.  Neck: Neck supple.  Cardiovascular: Normal rate.   Pulmonary/Chest: Effort normal. No respiratory distress.  Musculoskeletal: Normal range of motion.  Neurological: She is alert and oriented to person, place, and time.  Skin: Skin is warm and dry.  Skin tear to the lateral aspect of the right shin.   Psychiatric: She has a normal mood and affect. Her behavior is normal.   ED Course  Procedures (including critical care time)  DIAGNOSTIC STUDIES: Oxygen Saturation is 97% on room air, normal by my interpretation.     COORDINATION OF CARE: 4:51 PM-Discussed treatment plan which includes wound cleaning with pt at bedside and pt agreed to plan.   LACERATION REPAIR PROCEDURE NOTE The patient's identification was confirmed and consent was obtained. This procedure was performed by Etta Quill, NP at 5:07 PM. Site: Flap laceration to lateral aspect of right shin Sterile procedures observed: Saline Anesthetic used (type and amt): 4cc 2 % Xylocaine with Epinephrine  Suture type/size: 4.0 Prolene Length: 4 cm x 4 cm flap # of Sutures: 11 Technique: Simple interrupted Complexity: moderate Tetanus UTD  Labs Review Labs Reviewed - No data  to display  Imaging Review No results found.   EKG Interpretation None     Medications - No data to display  Patient discussed with and seen by Dr. Wyvonnia Dusky. MDM   Final diagnoses:  None    Laceration right lower leg.  I personally performed the services described in this documentation, which was scribed in my presence. The recorded information has been reviewed and is accurate.    Norman Herrlich, NP 01/24/14 [phone]

## 2014-01-24 NOTE — ED Provider Notes (Signed)
Medical screening examination/treatment/procedure(s) were performed by non-physician practitioner and as supervising physician I was immediately available for consultation/collaboration.   EKG Interpretation None        Mariea Clonts, MD 01/24/14 [phone]

## 2014-01-27 DIAGNOSIS — IMO0001 Reserved for inherently not codable concepts without codable children: Secondary | ICD-10-CM | POA: Diagnosis not present

## 2014-01-27 DIAGNOSIS — G43809 Other migraine, not intractable, without status migrainosus: Secondary | ICD-10-CM | POA: Diagnosis not present

## 2014-01-27 DIAGNOSIS — M47812 Spondylosis without myelopathy or radiculopathy, cervical region: Secondary | ICD-10-CM | POA: Diagnosis not present

## 2014-01-27 DIAGNOSIS — G894 Chronic pain syndrome: Secondary | ICD-10-CM | POA: Diagnosis not present

## 2014-02-02 ENCOUNTER — Encounter: Payer: Self-pay | Admitting: Internal Medicine

## 2014-02-02 ENCOUNTER — Ambulatory Visit (INDEPENDENT_AMBULATORY_CARE_PROVIDER_SITE_OTHER): Payer: Medicare Other | Admitting: Internal Medicine

## 2014-02-02 VITALS — BP 136/78 | HR 80 | Temp 98.4°F | Wt 178.0 lb

## 2014-02-02 DIAGNOSIS — S81009A Unspecified open wound, unspecified knee, initial encounter: Secondary | ICD-10-CM | POA: Diagnosis not present

## 2014-02-02 DIAGNOSIS — S81811A Laceration without foreign body, right lower leg, initial encounter: Secondary | ICD-10-CM

## 2014-02-02 DIAGNOSIS — S81809A Unspecified open wound, unspecified lower leg, initial encounter: Secondary | ICD-10-CM

## 2014-02-02 DIAGNOSIS — S91009A Unspecified open wound, unspecified ankle, initial encounter: Secondary | ICD-10-CM

## 2014-02-02 NOTE — Progress Notes (Signed)
   Subjective:    Patient ID: Kari Flynn, female    DOB: 30-Aug-1950, 63 y.o.   MRN: [id number]  HPI Followup on laceration to right lower leg which occurred May 29 while patient was doing yard work. Of branch fell on her leg causing a right angle type of laceration that required 11 sutures in the emergency department. Her tetanus immunization is up-to-date having been done in 2012. Patient was wearing jeans. Her jeans were not torn so the laceration was clean.    Review of Systems     Objective:   Physical Exam Right angle type laceration with 11 Prolene sutures in place. There are Steri-Strips in between the sutures. There is no evidence of secondary infection.       Assessment & Plan:  It's been 10 days since accident occurred. Am a little reluctant to take the sutures out at this point would like to reevaluate in one week. She has kept it clean and there is no evidence of secondary infection.

## 2014-02-02 NOTE — Patient Instructions (Signed)
Return in one week for reevaluation.

## 2014-02-09 ENCOUNTER — Encounter: Payer: Self-pay | Admitting: Internal Medicine

## 2014-02-09 ENCOUNTER — Ambulatory Visit (INDEPENDENT_AMBULATORY_CARE_PROVIDER_SITE_OTHER): Payer: Medicare Other | Admitting: Internal Medicine

## 2014-02-09 VITALS — BP 110/70 | Temp 98.0°F | Wt 178.0 lb

## 2014-02-09 DIAGNOSIS — T07XXXA Unspecified multiple injuries, initial encounter: Secondary | ICD-10-CM | POA: Diagnosis not present

## 2014-02-09 DIAGNOSIS — T148XXA Other injury of unspecified body region, initial encounter: Principal | ICD-10-CM

## 2014-02-09 DIAGNOSIS — L089 Local infection of the skin and subcutaneous tissue, unspecified: Secondary | ICD-10-CM

## 2014-02-09 MED ORDER — MUPIROCIN 2 % EX OINT: TOPICAL_OINTMENT | CUTANEOUS | Status: DC

## 2014-02-09 MED ORDER — LEVOFLOXACIN 500 MG PO TABS: ORAL_TABLET | ORAL | Status: DC

## 2014-02-09 MED ORDER — CEFTRIAXONE SODIUM 1 G IJ SOLR
1.0000 g | Freq: Once | INTRAMUSCULAR | Status: AC
  Administered 2014-02-09: 1 g via INTRAMUSCULAR

## 2014-02-09 NOTE — Patient Instructions (Addendum)
Clean laceration with peroxide twice daily and apply Bactroban ointment. Take Levaquin 500 milligrams daily for 7 days. You have been scheduled to see Dr. Iran Planas Wednesday 02/11/2014 at 9:40 am. Records have been faxed.

## 2014-02-09 NOTE — Progress Notes (Signed)
   Subjective:    Patient ID: Kari Flynn, female    DOB: 1951-03-13, 63 y.o.   MRN: [id number]  HPI Was seen June 8 for followup of laceration repair in emergency department which was done May 29. I do not think the sutures were ready for removal at that time. She returns today for followup. Now it appears that the laceration has become infected. It is draining some brownish discolored fluid. There is an area of redness about the laceration.    Review of Systems     Objective:   Physical Exam erythema about laceration with some brownish fluid discoloration.        Assessment & Plan:  Infected laceration right leg  Plan: Levaquin 500 milligrams daily for 7 days. Apply Bactroban ointment twice daily after cleaning with peroxide. Rocephin 1 g IM given. See if plastic surgeon will see her.

## 2014-02-11 DIAGNOSIS — Z4802 Encounter for removal of sutures: Secondary | ICD-10-CM | POA: Diagnosis not present

## 2014-02-11 DIAGNOSIS — Z5189 Encounter for other specified aftercare: Secondary | ICD-10-CM | POA: Diagnosis not present

## 2014-02-16 ENCOUNTER — Telehealth: Payer: Self-pay

## 2014-02-16 NOTE — Telephone Encounter (Signed)
Patient reports that Dr. Iran Planas removed the sutures from her lower leg. Told her no skin graft would be needed and the blackish, blue area would heal and fall off. No need to return.

## 2014-03-03 DIAGNOSIS — R5383 Other fatigue: Secondary | ICD-10-CM | POA: Diagnosis not present

## 2014-03-03 DIAGNOSIS — G4700 Insomnia, unspecified: Secondary | ICD-10-CM | POA: Diagnosis not present

## 2014-03-03 DIAGNOSIS — IMO0001 Reserved for inherently not codable concepts without codable children: Secondary | ICD-10-CM | POA: Diagnosis not present

## 2014-03-03 DIAGNOSIS — Z79899 Other long term (current) drug therapy: Secondary | ICD-10-CM | POA: Diagnosis not present

## 2014-03-03 DIAGNOSIS — M76899 Other specified enthesopathies of unspecified lower limb, excluding foot: Secondary | ICD-10-CM | POA: Diagnosis not present

## 2014-03-03 DIAGNOSIS — R5381 Other malaise: Secondary | ICD-10-CM | POA: Diagnosis not present

## 2014-03-03 DIAGNOSIS — M1710 Unilateral primary osteoarthritis, unspecified knee: Secondary | ICD-10-CM | POA: Diagnosis not present

## 2014-03-03 DIAGNOSIS — M542 Cervicalgia: Secondary | ICD-10-CM | POA: Diagnosis not present

## 2014-03-22 DIAGNOSIS — M170 Bilateral primary osteoarthritis of knee: Secondary | ICD-10-CM | POA: Insufficient documentation

## 2014-03-22 NOTE — Patient Instructions (Addendum)
Patient to see a gastroenterologist regarding persistent right lower caught her pain. May have constipation related to pain medications. Return in one year or as needed. May need MiraLAX or amitiza.

## 2014-03-30 DIAGNOSIS — M47817 Spondylosis without myelopathy or radiculopathy, lumbosacral region: Secondary | ICD-10-CM | POA: Diagnosis not present

## 2014-03-30 DIAGNOSIS — M47812 Spondylosis without myelopathy or radiculopathy, cervical region: Secondary | ICD-10-CM | POA: Diagnosis not present

## 2014-03-30 DIAGNOSIS — G43919 Migraine, unspecified, intractable, without status migrainosus: Secondary | ICD-10-CM | POA: Diagnosis not present

## 2014-03-30 DIAGNOSIS — M1710 Unilateral primary osteoarthritis, unspecified knee: Secondary | ICD-10-CM | POA: Diagnosis not present

## 2014-03-30 DIAGNOSIS — IMO0002 Reserved for concepts with insufficient information to code with codable children: Secondary | ICD-10-CM | POA: Diagnosis not present

## 2014-04-06 DIAGNOSIS — M1710 Unilateral primary osteoarthritis, unspecified knee: Secondary | ICD-10-CM | POA: Diagnosis not present

## 2014-04-13 DIAGNOSIS — M1710 Unilateral primary osteoarthritis, unspecified knee: Secondary | ICD-10-CM | POA: Diagnosis not present

## 2014-04-20 DIAGNOSIS — M1710 Unilateral primary osteoarthritis, unspecified knee: Secondary | ICD-10-CM | POA: Diagnosis not present

## 2014-04-27 DIAGNOSIS — M1710 Unilateral primary osteoarthritis, unspecified knee: Secondary | ICD-10-CM | POA: Diagnosis not present

## 2014-05-05 DIAGNOSIS — M1710 Unilateral primary osteoarthritis, unspecified knee: Secondary | ICD-10-CM | POA: Diagnosis not present

## 2014-05-13 ENCOUNTER — Other Ambulatory Visit: Payer: Self-pay | Admitting: Internal Medicine

## 2014-05-13 MED ORDER — MODAFINIL 200 MG PO TABS: 100.0000 mg | ORAL_TABLET | Freq: Every day | ORAL | Status: DC | PRN

## 2014-05-13 NOTE — Telephone Encounter (Signed)
Called refill to pharmacy voicemail.  

## 2014-06-02 DIAGNOSIS — M47812 Spondylosis without myelopathy or radiculopathy, cervical region: Secondary | ICD-10-CM | POA: Diagnosis not present

## 2014-06-02 DIAGNOSIS — M179 Osteoarthritis of knee, unspecified: Secondary | ICD-10-CM | POA: Diagnosis not present

## 2014-06-02 DIAGNOSIS — G894 Chronic pain syndrome: Secondary | ICD-10-CM | POA: Diagnosis not present

## 2014-06-02 DIAGNOSIS — G43109 Migraine with aura, not intractable, without status migrainosus: Secondary | ICD-10-CM | POA: Diagnosis not present

## 2014-06-14 IMAGING — CR DG ABDOMEN 2V
2 series · 2 of 2 positions shown · non-contrast
Comparison: CT abdomen pelvis of 10/03/2013

CLINICAL DATA: Lower abdominal pain for 3 weeks, nausea, vomiting,
constipation, colonoscopy 2 days ago

EXAM:
ABDOMEN - 2 VIEW

[view not recorded (1 of 2)]
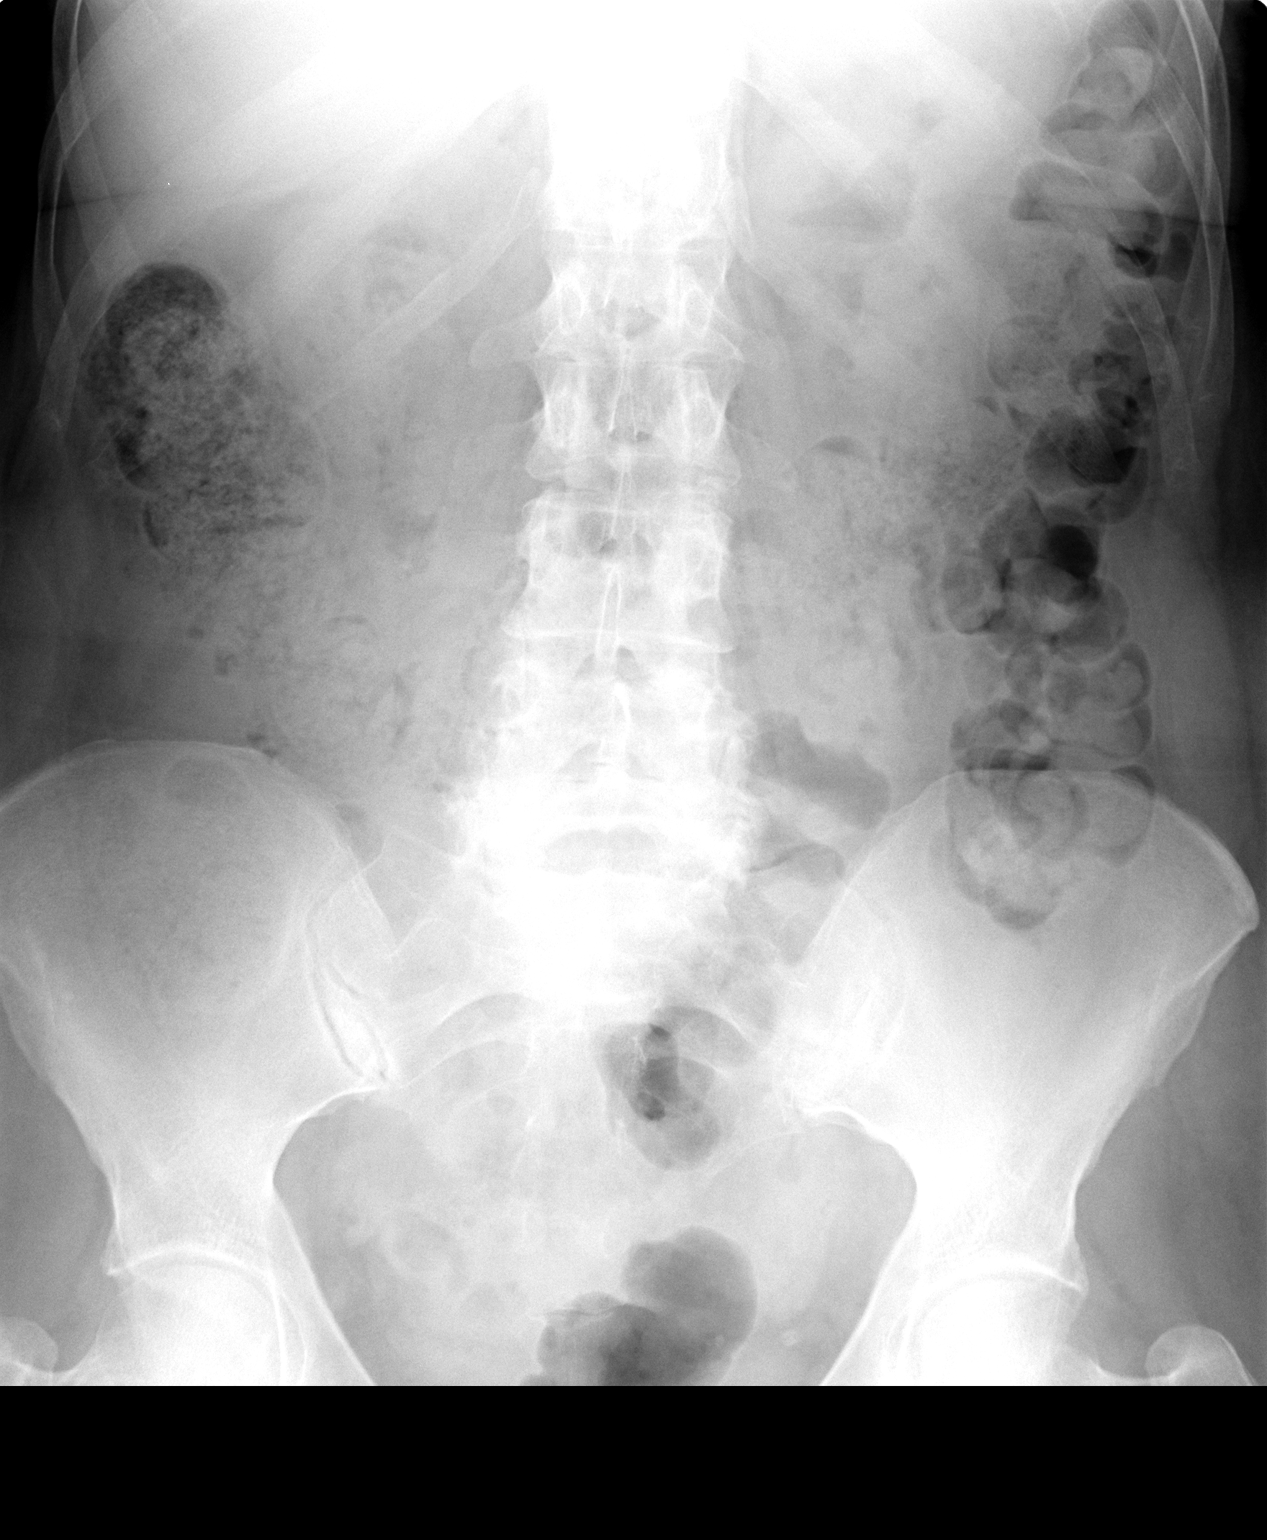

[view not recorded (2 of 2)]
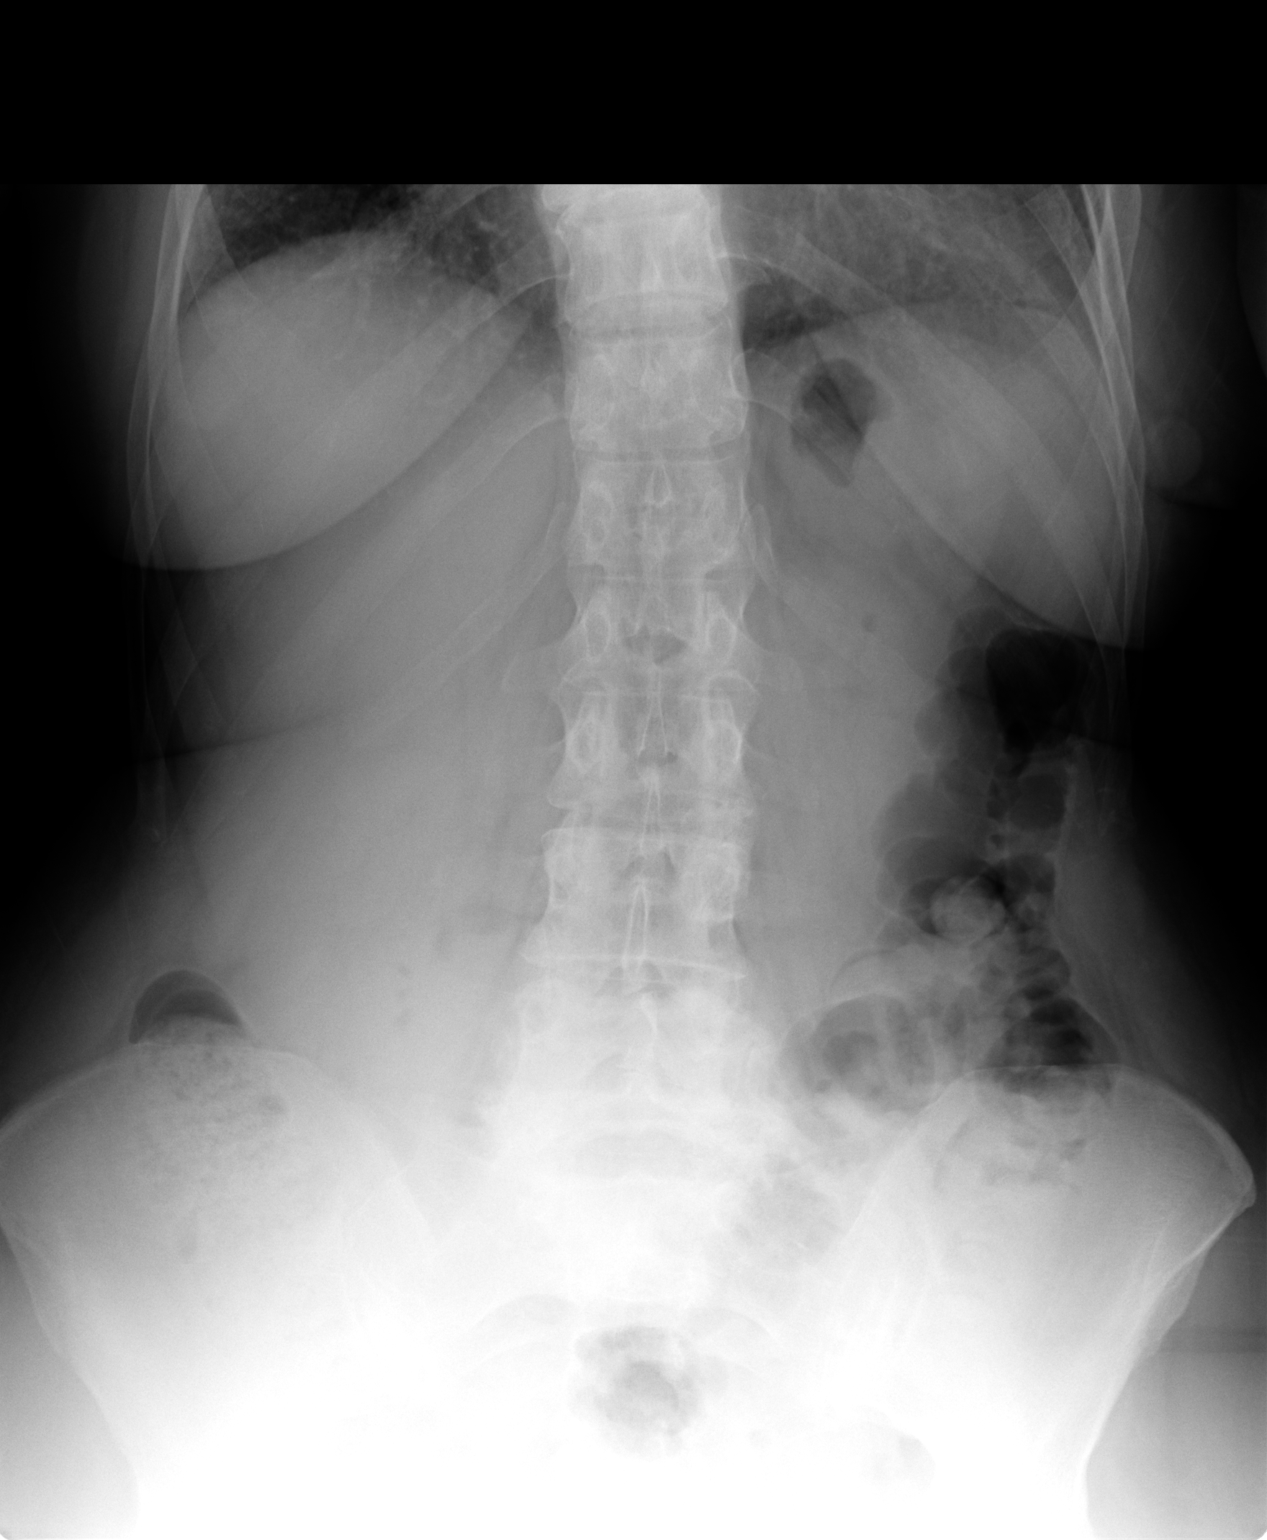

[2 of 2 positions shown; findings below may reference images not displayed]

FINDINGS: Supine and erect views of the abdomen show a moderate to large
amount of feces throughout the colon. No bowel obstruction is seen.
No free air is noted. There are degenerative changes in the lower
lumbar spine.
IMPRESSION: Moderate to large amount of feces throughout the colon. No bowel
obstruction. No free air.

## 2014-07-02 ENCOUNTER — Other Ambulatory Visit: Payer: Self-pay

## 2014-07-02 DIAGNOSIS — Z1231 Encounter for screening mammogram for malignant neoplasm of breast: Secondary | ICD-10-CM

## 2014-07-13 DIAGNOSIS — Z23 Encounter for immunization: Secondary | ICD-10-CM | POA: Diagnosis not present

## 2014-07-28 DIAGNOSIS — Z79891 Long term (current) use of opiate analgesic: Secondary | ICD-10-CM | POA: Diagnosis not present

## 2014-07-28 DIAGNOSIS — M47812 Spondylosis without myelopathy or radiculopathy, cervical region: Secondary | ICD-10-CM | POA: Diagnosis not present

## 2014-07-28 DIAGNOSIS — G43109 Migraine with aura, not intractable, without status migrainosus: Secondary | ICD-10-CM | POA: Diagnosis not present

## 2014-07-28 DIAGNOSIS — G894 Chronic pain syndrome: Secondary | ICD-10-CM | POA: Diagnosis not present

## 2014-08-03 ENCOUNTER — Ambulatory Visit: Payer: Medicare Other | Admitting: Internal Medicine

## 2014-08-05 ENCOUNTER — Other Ambulatory Visit: Payer: Self-pay

## 2014-08-05 ENCOUNTER — Ambulatory Visit
Admission: RE | Admit: 2014-08-05 | Discharge: 2014-08-05 | Disposition: A | Discharge status: Home / Self Care | Payer: Medicare Other | Source: Ambulatory Visit

## 2014-08-05 DIAGNOSIS — Z1231 Encounter for screening mammogram for malignant neoplasm of breast: Secondary | ICD-10-CM | POA: Diagnosis not present

## 2014-08-07 ENCOUNTER — Other Ambulatory Visit: Payer: Self-pay | Admitting: Internal Medicine

## 2014-08-07 DIAGNOSIS — R928 Other abnormal and inconclusive findings on diagnostic imaging of breast: Secondary | ICD-10-CM

## 2014-08-10 ENCOUNTER — Other Ambulatory Visit: Payer: Self-pay | Admitting: Internal Medicine

## 2014-08-10 DIAGNOSIS — R928 Other abnormal and inconclusive findings on diagnostic imaging of breast: Secondary | ICD-10-CM

## 2014-08-14 ENCOUNTER — Encounter: Payer: Self-pay | Admitting: Internal Medicine

## 2014-08-14 ENCOUNTER — Ambulatory Visit (INDEPENDENT_AMBULATORY_CARE_PROVIDER_SITE_OTHER): Payer: Medicare Other | Admitting: Internal Medicine

## 2014-08-14 VITALS — BP 130/86 | HR 69 | Ht 66.0 in | Wt 179.0 lb

## 2014-08-14 DIAGNOSIS — G4730 Sleep apnea, unspecified: Secondary | ICD-10-CM | POA: Diagnosis not present

## 2014-08-14 DIAGNOSIS — G4710 Hypersomnia, unspecified: Secondary | ICD-10-CM | POA: Insufficient documentation

## 2014-08-14 DIAGNOSIS — G4733 Obstructive sleep apnea (adult) (pediatric): Secondary | ICD-10-CM

## 2014-08-14 NOTE — Patient Instructions (Signed)
We can continue CPAP 11/ [facility]  We can continue modafinil- please call as needed

## 2014-08-14 NOTE — Assessment & Plan Note (Signed)
She feels continued need for modafinil although she is sure she is getting enough sleep at night. No cataplexy. She considers this really a problem of "fatigue" related to fibromyalgia. One half tab of modafinil daily has been sufficient.

## 2014-08-14 NOTE — Assessment & Plan Note (Signed)
Good compliance and control at CPAP 11. No changes required. Comfort issues addressed.

## 2014-08-14 NOTE — Progress Notes (Signed)
Patient ID: Kari Flynn, female    DOB: 1950-09-07, 63 y.o.   MRN: [id number]  HPI 01/31/11- 53 yoF former smoker, followed for OSA, Insomnia, complicated by hx of fibromyalgia and migraine.  Last here June 23, 2010- note reviewed. Continues CPAP 10 w/ trazodone now 3 x 50 mg at bedtime, methadone in the mornings. For a couple of months, sleep has not been restorative and she has felt fatigued. She saw Dr Nicholaus Bloom who got Epworth score 13/24. Says she has trouble with memory and concentration. Remembers past script for provigil, which she never tried.  Sleeps soundly 5-6 hrs, then awake when drugs wear off. Denies moving around much in bed. Aches all over and just as tired when she gets up as when she went to bed. Not told by husband that she is snoring through CPAP. On Disability since 2005- being reassessed. Naps minimally refreshing if she has to take one. 2 cups caf coffee in AM. Thyroid checked annually.   08/03/11-  59 yoF former smoker, followed for OSA, Insomnia, complicated by hx of fibromyalgia and migraine.  She continues to use CPAP all night every night at [facility]. Much less need for afternoon naps. Provigil is being taken for 5 days of each week. By staying awake in the daytime she is now able to sleep better at night. She is satisfied with her care. Still taking trazodone for sleep and methadone 5 mg 3 times a day. We have discussed sedating potential of methadone.  08/01/12-  61 yoF former smoker, followed for OSA, Insomnia, complicated by hx of fibromyalgia and migraine.  FOLLOWS FOR: wears CPAP 10/ [facility] every night about 7 hours; would like to disucuss pressure-increase or not. CPAP definitely helps her using a fullface mask. Sleeps through the night. She is curious to try increasing pressure to see what that would be like. Provigil has been a big help taking one half tablet per day when needed. Treated by Dr. Hardin Negus for fibromyalgia and migraine.  Now on Flexeril which also helps her sleep.  08/01/13- 62 yoF former smoker, followed for OSA, Insomnia, complicated by hx of fibromyalgia and migraine. Follows MBT:DHRCB CPAP 6-7 hrs /night -Mask fits well -   [facility] : Pt is eligible for a new machine - Patient says the machine is making nose/ sounds different.She continues to wear it every night and it has definitely helped. Residual daytime somnolence is controlled with appropriate use of Provigil when needed.   Wants flu shot - Needs refill on Provigil  08/14/14-  62 yoF former smoker, followed for OSA, Insomnia, complicated by hx of fibromyalgia and migraine. FOLLOW FOR:  OSA; wears CPAP every night approx 7 hours nightly; no complaints CPAP 11/ [facility] She feels she is doing very well with CPAP and pressure is good. Sleeps well. Still needs modafinil every day "I depend on it" for energy because of fibromyalgia. Usually one half tab is sufficient.  ROS-see HPI Constitutional:   No-   weight loss, night sweats, fevers, chills, +fatigue, lassitude. HEENT:   No-  headaches, difficulty swallowing, tooth/dental problems, sore throat,       No-  sneezing, itching, ear ache, nasal congestion, post nasal drip,  CV:  No-   chest pain, orthopnea, PND, swelling in lower extremities, anasarca,  dizziness, palpitations Resp: No- acute  shortness of breath with exertion or at rest.  No-   productive cough,  No non-productive cough,  No- coughing up of blood.              No-   change in color of mucus.  No- wheezing.   Skin: No-   rash or lesions. GI:  No-   heartburn, indigestion, abdominal pain, nausea, vomiting,  GU:  MS:  No-   joint pain or swelling.   Neuro-     nothing unusual Psych:  No- change in mood or affect. No depression or anxiety.  No memory loss.   Objective:   Physical Exam General- Alert, Oriented, Affect-normal, Distress- none acute; medium build Skin- rash-none, lesions- none, excoriation-  none Lymphadenopathy- none Head- atraumatic            Eyes- Gross vision intact, PERRLA, conjunctivae clear secretions; periorbital edema            Ears- Hearing, canals-normal            Nose- Clear, no-Septal dev, mucus, polyps, erosion, perforation             Throat- Mallampati III-IV , mucosa clear , drainage- none, tonsils- atrophic Neck- flexible , trachea midline, no stridor , thyroid nl, carotid no bruit Chest - symmetrical excursion , unlabored           Heart/CV- RRR , no murmur , no gallop  , no rub, nl s1 s2                           - JVD- none , edema- none, stasis changes- none, varices- none           Lung- clear to P&A, wheeze- none, cough- none , dullness-none, rub- none           Chest wall-  Abd-  Br/ Gen/ Rectal- Not done, not indicated Extrem- cyanosis- none, clubbing, none, atrophy- none, strength- nl Neuro- grossly intact to observation

## 2014-08-25 ENCOUNTER — Ambulatory Visit
Admission: RE | Admit: 2014-08-25 | Discharge: 2014-08-25 | Disposition: A | Discharge status: Home / Self Care | Payer: Medicare Other | Source: Ambulatory Visit | Attending: Internal Medicine | Admitting: Internal Medicine

## 2014-08-25 DIAGNOSIS — R928 Other abnormal and inconclusive findings on diagnostic imaging of breast: Secondary | ICD-10-CM

## 2014-08-25 DIAGNOSIS — R922 Inconclusive mammogram: Secondary | ICD-10-CM | POA: Diagnosis not present

## 2014-09-09 DIAGNOSIS — Z1389 Encounter for screening for other disorder: Secondary | ICD-10-CM | POA: Diagnosis not present

## 2014-09-09 DIAGNOSIS — L723 Sebaceous cyst: Secondary | ICD-10-CM | POA: Diagnosis not present

## 2014-09-09 DIAGNOSIS — B078 Other viral warts: Secondary | ICD-10-CM | POA: Diagnosis not present

## 2014-09-28 DIAGNOSIS — G894 Chronic pain syndrome: Secondary | ICD-10-CM | POA: Diagnosis not present

## 2014-09-28 DIAGNOSIS — Z79891 Long term (current) use of opiate analgesic: Secondary | ICD-10-CM | POA: Diagnosis not present

## 2014-09-28 DIAGNOSIS — G43109 Migraine with aura, not intractable, without status migrainosus: Secondary | ICD-10-CM | POA: Diagnosis not present

## 2014-09-28 DIAGNOSIS — M47812 Spondylosis without myelopathy or radiculopathy, cervical region: Secondary | ICD-10-CM | POA: Diagnosis not present

## 2014-10-06 DIAGNOSIS — M170 Bilateral primary osteoarthritis of knee: Secondary | ICD-10-CM | POA: Diagnosis not present

## 2014-10-06 DIAGNOSIS — R5383 Other fatigue: Secondary | ICD-10-CM | POA: Diagnosis not present

## 2014-10-06 DIAGNOSIS — E669 Obesity, unspecified: Secondary | ICD-10-CM | POA: Diagnosis not present

## 2014-10-06 DIAGNOSIS — M797 Fibromyalgia: Secondary | ICD-10-CM | POA: Diagnosis not present

## 2014-10-09 ENCOUNTER — Other Ambulatory Visit: Payer: Medicare Other | Admitting: Internal Medicine

## 2014-10-09 DIAGNOSIS — Z13220 Encounter for screening for lipoid disorders: Secondary | ICD-10-CM

## 2014-10-09 DIAGNOSIS — R5383 Other fatigue: Secondary | ICD-10-CM

## 2014-10-09 DIAGNOSIS — Z79899 Other long term (current) drug therapy: Secondary | ICD-10-CM

## 2014-10-09 DIAGNOSIS — Z136 Encounter for screening for cardiovascular disorders: Secondary | ICD-10-CM | POA: Diagnosis not present

## 2014-10-09 DIAGNOSIS — E559 Vitamin D deficiency, unspecified: Secondary | ICD-10-CM

## 2014-10-09 DIAGNOSIS — M797 Fibromyalgia: Secondary | ICD-10-CM | POA: Diagnosis not present

## 2014-10-09 LAB — COMPREHENSIVE METABOLIC PANEL
ALT: 28 U/L (ref 0–35)
AST: 30 U/L (ref 0–37)
Albumin: 4 g/dL (ref 3.5–5.2)
Alkaline Phosphatase: 87 U/L (ref 39–117)
BILIRUBIN TOTAL: 0.8 mg/dL (ref 0.2–1.2)
BUN: 18 mg/dL (ref 6–23)
CO2: 28 mEq/L (ref 19–32)
CREATININE: 0.63 mg/dL (ref 0.50–1.10)
Calcium: 9.5 mg/dL (ref 8.4–10.5)
Chloride: 104 mEq/L (ref 96–112)
Glucose, Bld: 85 mg/dL (ref 70–99)
POTASSIUM: 4.4 meq/L (ref 3.5–5.3)
Sodium: 140 mEq/L (ref 135–145)
TOTAL PROTEIN: 6.6 g/dL (ref 6.0–8.3)

## 2014-10-09 LAB — CBC WITH DIFFERENTIAL/PLATELET
Basophils Absolute: 0 10*3/uL (ref 0.0–0.1)
Basophils Relative: 1 % (ref 0–1)
EOS PCT: 2 % (ref 0–5)
Eosinophils Absolute: 0.1 10*3/uL (ref 0.0–0.7)
HEMATOCRIT: 42.2 % (ref 36.0–46.0)
Hemoglobin: 14.1 g/dL (ref 12.0–15.0)
LYMPHS ABS: 1.4 10*3/uL (ref 0.7–4.0)
Lymphocytes Relative: 36 % (ref 12–46)
MCH: 30 pg (ref 26.0–34.0)
MCHC: 33.4 g/dL (ref 30.0–36.0)
MCV: 89.8 fL (ref 78.0–100.0)
MONO ABS: 0.4 10*3/uL (ref 0.1–1.0)
MPV: 9.5 fL (ref 8.6–12.4)
Monocytes Relative: 9 % (ref 3–12)
NEUTROS ABS: 2 10*3/uL (ref 1.7–7.7)
Neutrophils Relative %: 52 % (ref 43–77)
Platelets: 209 10*3/uL (ref 150–400)
RBC: 4.7 MIL/uL (ref 3.87–5.11)
RDW: 14.1 % (ref 11.5–15.5)
WBC: 3.9 10*3/uL — ABNORMAL LOW (ref 4.0–10.5)

## 2014-10-09 LAB — LIPID PANEL
Cholesterol: 198 mg/dL (ref 0–200)
HDL: 87 mg/dL (ref >39)
LDL Cholesterol: 97 mg/dL (ref 0–99)
TRIGLYCERIDES: 69 mg/dL (ref <150)
Total CHOL/HDL Ratio: 2.3 Ratio
VLDL: 14 mg/dL (ref 0–40)

## 2014-10-09 LAB — TSH: TSH: 2.851 u[IU]/mL (ref 0.350–4.500)

## 2014-10-10 LAB — VITAMIN D 25 HYDROXY (VIT D DEFICIENCY, FRACTURES): Vit D, 25-Hydroxy: 29 ng/mL — ABNORMAL LOW (ref 30–100)

## 2014-10-12 ENCOUNTER — Encounter: Payer: Medicare Other | Admitting: Internal Medicine

## 2014-10-19 ENCOUNTER — Encounter: Payer: Self-pay | Admitting: Internal Medicine

## 2014-10-19 ENCOUNTER — Ambulatory Visit (INDEPENDENT_AMBULATORY_CARE_PROVIDER_SITE_OTHER): Payer: Medicare Other | Admitting: Internal Medicine

## 2014-10-19 VITALS — BP 118/84 | HR 84 | Temp 97.4°F | Wt 177.0 lb

## 2014-10-19 DIAGNOSIS — Z8669 Personal history of other diseases of the nervous system and sense organs: Secondary | ICD-10-CM

## 2014-10-19 DIAGNOSIS — M797 Fibromyalgia: Secondary | ICD-10-CM | POA: Diagnosis not present

## 2014-10-19 DIAGNOSIS — J069 Acute upper respiratory infection, unspecified: Secondary | ICD-10-CM

## 2014-10-19 DIAGNOSIS — M8580 Other specified disorders of bone density and structure, unspecified site: Secondary | ICD-10-CM | POA: Diagnosis not present

## 2014-10-19 DIAGNOSIS — Z0000 Encounter for general adult medical examination without abnormal findings: Secondary | ICD-10-CM

## 2014-10-19 DIAGNOSIS — H65112 Acute and subacute allergic otitis media (mucoid) (sanguinous) (serous), left ear: Secondary | ICD-10-CM | POA: Diagnosis not present

## 2014-10-19 DIAGNOSIS — M170 Bilateral primary osteoarthritis of knee: Secondary | ICD-10-CM | POA: Diagnosis not present

## 2014-10-19 LAB — POCT URINALYSIS DIPSTICK
Bilirubin, UA: NEGATIVE
Blood, UA: NEGATIVE
GLUCOSE UA: NEGATIVE
Ketones, UA: NEGATIVE
Leukocytes, UA: NEGATIVE
Nitrite, UA: NEGATIVE
PH UA: 6
Protein, UA: NEGATIVE
Spec Grav, UA: <=1.005
UROBILINOGEN UA: NEGATIVE

## 2014-10-19 MED ORDER — FLUCONAZOLE 150 MG PO TABS: 150.0000 mg | ORAL_TABLET | Freq: Once | ORAL | Status: DC

## 2014-10-19 MED ORDER — AZITHROMYCIN 250 MG PO TABS: ORAL_TABLET | ORAL | Status: DC

## 2014-10-19 NOTE — Progress Notes (Signed)
Subjective:    Patient ID: Kari Flynn, female    DOB: December 16, 1950, 64 y.o.   MRN: [id number]  HPI 64 year old  White Female for health maintenance and evaluation of medical issues. Dr. Nicholaus Bloom, pain management physician, sees her for pain control. Patient has a history of constipation.  History of fibromyalgia and knee osteoarthritis followed by Dr. Estanislado Pandy.. Has knee injections about every 6 months. History of sleep apnea. Uses C Pap machine.  FHx: Brother had MI with hx of DM and now has CKD after MI. Mother died 16-Oct-2013  of congestive heart failure. Father died of multiple myeloma. Mother had hypertension and gout.  Past medical history: D&C 1979, bilateral tubal ligation 1987, bunionectomy 1990, benign right breast biopsy February 1995.  Patient is on Provigil, Flexeril, methadone, Relpax and Frova.  Social history: She used to work as a Equities trader. Married with 2 adult sons. Quit smoking in 10/16/77. Formerly smoked a pack of cigarettes daily for 10 years. May consume 1 ounce of alcohol weekly at most.  Had colonoscopy in 10-16-13 by Dr. Olevia Perches for right lower quadrant abdominal pain. This was normal.  Had infected laceration right leg Summer of 2015.    Review of Systems  frequent migraine headaches, chronic pain from fibromyalgia and osteoarthritis of knees. One headache every one to 2 weeks. Has nevus in back of either an ophthalmologist is watching. Fatigue.     Objective:   Physical Exam  Constitutional: She is oriented to person, place, and time. She appears well-developed and well-nourished. No distress.  HENT:  Head: Normocephalic and atraumatic.  Right Ear: External ear normal.  Left Ear: External ear normal.  Mouth/Throat: Oropharynx is clear and moist. No oropharyngeal exudate.  Eyes: Conjunctivae and EOM are normal. Pupils are equal, round, and reactive to light. Right eye exhibits no discharge. Left eye exhibits no discharge. No scleral icterus.  Neck:  Neck supple. No JVD present. No thyromegaly present.  Cardiovascular: Normal rate, regular rhythm, normal heart sounds and intact distal pulses.   No murmur heard. Pulmonary/Chest: Effort normal and breath sounds normal. No respiratory distress. She has no wheezes. She exhibits no tenderness.  Breasts normal female  Abdominal: Soft. Bowel sounds are normal. She exhibits no distension and no mass. There is no tenderness. There is no rebound and no guarding.  Musculoskeletal: She exhibits no edema.  Lymphadenopathy:    She has no cervical adenopathy.  Neurological: She is alert and oriented to person, place, and time. She has normal reflexes. No cranial nerve deficit. Coordination normal.  Skin: Skin is warm and dry. No rash noted. She is not diaphoretic.  Psychiatric: She has a normal mood and affect. Her behavior is normal. Judgment and thought content normal.  Vitals reviewed.         Assessment & Plan:  History of migraine headaches  History of sleep apnea  Fibromyalgia syndrome  Osteoarthritis both knees  Chronic fatigue  Acute URI with left acute otitis media  Plan: Return in one year or as needed. Vitamin D is slightly low at 29. Needs to take 2000 units vitamin D 3 daily. TSH is normal. Hemoglobin is normal. Glucose, BUN/creatinine, electrolytes and liver functions are normal. Take Zithromax Z-PAK as prescribed  Subjective:   Patient presents for Medicare Annual/Subsequent preventive examination.  Review Past Medical/Family/Social: See above   Risk Factors  Current exercise habits: Sedentary due to knee pain and fibromyalgia Dietary issues discussed: Low fat low carbohydrate  Cardiac  risk factors:  Depression Screen  (Note: if answer to either of the following is "Yes", a more complete depression screening is indicated)   Over the past two weeks, have you felt down, depressed or hopeless? No  Over the past two weeks, have you felt little interest or pleasure in  doing things? No Have you lost interest or pleasure in daily life? No Do you often feel hopeless? No Do you cry easily over simple problems? No   Activities of Daily Living  In your present state of health, do you have any difficulty performing the following activities?:   Driving? No  Managing money? No  Feeding yourself? No  Getting from bed to chair? No  Climbing a flight of stairs? No  Preparing food and eating?: No  Bathing or showering? No  Getting dressed: No  Getting to the toilet? No  Using the toilet:No  Moving around from place to place: No  In the past year have you fallen or had a near fall?: 1 Are you sexually active? Yes Do you have more than one partner? No   Hearing Difficulties: No  Do you often ask people to speak up or repeat themselves? No  Do you experience ringing or noises in your ears? No  Do you have difficulty understanding soft or whispered voices? No  Do you feel that you have a problem with memory? No Do you often misplace items? No    Home Safety:  Do you have a smoke alarm at your residence? Yes Do you have grab bars in the bathroom? No Do you have throw rugs in your house? Yes   Cognitive Testing  Alert? Yes Normal Appearance?Yes  Oriented to person? Yes Place? Yes  Time? Yes  Recall of three objects? Yes  Can perform simple calculations? Yes  Displays appropriate judgment?Yes  Can read the correct time from a watch face?Yes   List the Names of Other Physician/Practitioners you currently use:  See referral list for the physicians patient is currently seeing.     Review of Systems: See above   Objective:     General appearance: Appears stated age and mildly obese  Head: Normocephalic, without obvious abnormality, atraumatic  Eyes: conj clear, EOMi PEERLA  Ears: normal TM's and external ear canals both ears  Nose: Nares normal. Septum midline. Mucosa normal. No drainage or sinus tenderness.  Throat: lips, mucosa, and tongue  normal; teeth and gums normal  Neck: no adenopathy, no carotid bruit, no JVD, supple, symmetrical, trachea midline and thyroid not enlarged, symmetric, no tenderness/mass/nodules  No CVA tenderness.  Lungs: clear to auscultation bilaterally  Breasts: normal appearance, no masses or tenderness Heart: regular rate and rhythm, S1, S2 normal, no murmur, click, rub or gallop  Abdomen: soft, non-tender; bowel sounds normal; no masses, no organomegaly  Musculoskeletal: ROM normal in all joints, no crepitus, no deformity, Normal muscle strengthen. Back  is symmetric, no curvature. Skin: Skin color, texture, turgor normal. No rashes or lesions  Lymph nodes: Cervical, supraclavicular, and axillary nodes normal.  Neurologic: CN 2 -12 Normal, Normal symmetric reflexes. Normal coordination and gait  Psych: Alert & Oriented x 3, Mood appear stable.    Assessment:    Annual wellness medicare exam   Plan:    During the course of the visit the patient was educated and counseled about appropriate screening and preventive services including:  Annual mammogram  Colonoscopy done in 2015  Bone density study-ordered     Patient Instructions (the written  plan) was given to the patient.  Medicare Attestation  I have personally reviewed:  The patient's medical and social history  Their use of alcohol, tobacco or illicit drugs  Their current medications and supplements  The patient's functional ability including ADLs,fall risks, home safety risks, cognitive, and hearing and visual impairment  Diet and physical activities  Evidence for depression or mood disorders  The patient's weight, height, BMI, and visual acuity have been recorded in the chart. I have made referrals, counseling, and provided education to the patient based on review of the above and I have provided the patient with a written personalized care plan for preventive services.      

## 2014-11-04 DIAGNOSIS — M170 Bilateral primary osteoarthritis of knee: Secondary | ICD-10-CM | POA: Diagnosis not present

## 2014-11-05 ENCOUNTER — Encounter: Payer: Self-pay | Admitting: Internal Medicine

## 2014-11-05 ENCOUNTER — Ambulatory Visit (INDEPENDENT_AMBULATORY_CARE_PROVIDER_SITE_OTHER): Payer: Medicare Other | Admitting: Internal Medicine

## 2014-11-05 VITALS — BP 138/84 | HR 82 | Temp 97.9°F

## 2014-11-05 DIAGNOSIS — Z23 Encounter for immunization: Secondary | ICD-10-CM | POA: Diagnosis not present

## 2014-11-05 MED ORDER — PNEUMOCOCCAL 13-VAL CONJ VACC IM SUSP
0.5000 mL | Freq: Once | INTRAMUSCULAR | Status: AC
  Administered 2014-11-05: 0.5 mL via INTRAMUSCULAR

## 2014-11-05 NOTE — Progress Notes (Signed)
Patient presents for Prevnar injection today. VS stable . Patient tolerated well.

## 2014-11-09 ENCOUNTER — Ambulatory Visit
Admission: RE | Admit: 2014-11-09 | Discharge: 2014-11-09 | Disposition: A | Discharge status: Home / Self Care | Payer: Medicare Other | Source: Ambulatory Visit | Attending: Internal Medicine | Admitting: Internal Medicine

## 2014-11-09 DIAGNOSIS — M8580 Other specified disorders of bone density and structure, unspecified site: Secondary | ICD-10-CM

## 2014-11-09 DIAGNOSIS — Z13820 Encounter for screening for osteoporosis: Secondary | ICD-10-CM | POA: Diagnosis not present

## 2014-11-09 DIAGNOSIS — Z780 Asymptomatic menopausal state: Secondary | ICD-10-CM | POA: Diagnosis not present

## 2014-11-11 DIAGNOSIS — M170 Bilateral primary osteoarthritis of knee: Secondary | ICD-10-CM | POA: Diagnosis not present

## 2014-11-18 DIAGNOSIS — M170 Bilateral primary osteoarthritis of knee: Secondary | ICD-10-CM | POA: Diagnosis not present

## 2014-11-25 DIAGNOSIS — M170 Bilateral primary osteoarthritis of knee: Secondary | ICD-10-CM | POA: Diagnosis not present

## 2014-11-25 NOTE — Patient Instructions (Signed)
Take Zithromax Z-PAK as directed for respiratory infection. Continue same meds and return in one year

## 2014-11-30 DIAGNOSIS — M47812 Spondylosis without myelopathy or radiculopathy, cervical region: Secondary | ICD-10-CM | POA: Diagnosis not present

## 2014-11-30 DIAGNOSIS — Z79891 Long term (current) use of opiate analgesic: Secondary | ICD-10-CM | POA: Diagnosis not present

## 2014-11-30 DIAGNOSIS — G894 Chronic pain syndrome: Secondary | ICD-10-CM | POA: Diagnosis not present

## 2014-11-30 DIAGNOSIS — G43109 Migraine with aura, not intractable, without status migrainosus: Secondary | ICD-10-CM | POA: Diagnosis not present

## 2014-12-02 DIAGNOSIS — M170 Bilateral primary osteoarthritis of knee: Secondary | ICD-10-CM | POA: Diagnosis not present

## 2014-12-14 DIAGNOSIS — H2513 Age-related nuclear cataract, bilateral: Secondary | ICD-10-CM | POA: Diagnosis not present

## 2014-12-14 DIAGNOSIS — D3132 Benign neoplasm of left choroid: Secondary | ICD-10-CM | POA: Diagnosis not present

## 2014-12-14 DIAGNOSIS — H02403 Unspecified ptosis of bilateral eyelids: Secondary | ICD-10-CM | POA: Diagnosis not present

## 2014-12-14 DIAGNOSIS — H5213 Myopia, bilateral: Secondary | ICD-10-CM | POA: Diagnosis not present

## 2015-01-07 ENCOUNTER — Telehealth: Payer: Self-pay | Admitting: Internal Medicine

## 2015-01-07 MED ORDER — MODAFINIL 200 MG PO TABS: 100.0000 mg | ORAL_TABLET | Freq: Every day | ORAL | Status: DC | PRN

## 2015-01-07 NOTE — Telephone Encounter (Signed)
Rx has been called in. Pt is aware. Nothing further was needed. 

## 2015-01-29 DIAGNOSIS — Z79891 Long term (current) use of opiate analgesic: Secondary | ICD-10-CM | POA: Diagnosis not present

## 2015-01-29 DIAGNOSIS — G43109 Migraine with aura, not intractable, without status migrainosus: Secondary | ICD-10-CM | POA: Diagnosis not present

## 2015-01-29 DIAGNOSIS — M47812 Spondylosis without myelopathy or radiculopathy, cervical region: Secondary | ICD-10-CM | POA: Diagnosis not present

## 2015-01-29 DIAGNOSIS — G894 Chronic pain syndrome: Secondary | ICD-10-CM | POA: Diagnosis not present

## 2015-03-26 DIAGNOSIS — M1812 Unilateral primary osteoarthritis of first carpometacarpal joint, left hand: Secondary | ICD-10-CM | POA: Diagnosis not present

## 2015-03-26 DIAGNOSIS — M654 Radial styloid tenosynovitis [de Quervain]: Secondary | ICD-10-CM | POA: Diagnosis not present

## 2015-03-30 DIAGNOSIS — Z79891 Long term (current) use of opiate analgesic: Secondary | ICD-10-CM | POA: Diagnosis not present

## 2015-03-30 DIAGNOSIS — G894 Chronic pain syndrome: Secondary | ICD-10-CM | POA: Diagnosis not present

## 2015-03-30 DIAGNOSIS — M47812 Spondylosis without myelopathy or radiculopathy, cervical region: Secondary | ICD-10-CM | POA: Diagnosis not present

## 2015-03-30 DIAGNOSIS — G43109 Migraine with aura, not intractable, without status migrainosus: Secondary | ICD-10-CM | POA: Diagnosis not present

## 2015-05-07 DIAGNOSIS — M654 Radial styloid tenosynovitis [de Quervain]: Secondary | ICD-10-CM | POA: Diagnosis not present

## 2015-05-07 DIAGNOSIS — M1812 Unilateral primary osteoarthritis of first carpometacarpal joint, left hand: Secondary | ICD-10-CM | POA: Diagnosis not present

## 2015-05-31 DIAGNOSIS — Z79891 Long term (current) use of opiate analgesic: Secondary | ICD-10-CM | POA: Diagnosis not present

## 2015-05-31 DIAGNOSIS — G43109 Migraine with aura, not intractable, without status migrainosus: Secondary | ICD-10-CM | POA: Diagnosis not present

## 2015-05-31 DIAGNOSIS — M47812 Spondylosis without myelopathy or radiculopathy, cervical region: Secondary | ICD-10-CM | POA: Diagnosis not present

## 2015-05-31 DIAGNOSIS — G894 Chronic pain syndrome: Secondary | ICD-10-CM | POA: Diagnosis not present

## 2015-06-07 DIAGNOSIS — M1812 Unilateral primary osteoarthritis of first carpometacarpal joint, left hand: Secondary | ICD-10-CM | POA: Diagnosis not present

## 2015-06-07 DIAGNOSIS — M654 Radial styloid tenosynovitis [de Quervain]: Secondary | ICD-10-CM | POA: Diagnosis not present

## 2015-06-18 DIAGNOSIS — R5381 Other malaise: Secondary | ICD-10-CM | POA: Diagnosis not present

## 2015-06-18 DIAGNOSIS — G4709 Other insomnia: Secondary | ICD-10-CM | POA: Diagnosis not present

## 2015-06-18 DIAGNOSIS — M170 Bilateral primary osteoarthritis of knee: Secondary | ICD-10-CM | POA: Diagnosis not present

## 2015-06-18 DIAGNOSIS — M797 Fibromyalgia: Secondary | ICD-10-CM | POA: Diagnosis not present

## 2015-07-01 DIAGNOSIS — M797 Fibromyalgia: Secondary | ICD-10-CM | POA: Diagnosis not present

## 2015-07-01 DIAGNOSIS — M542 Cervicalgia: Secondary | ICD-10-CM | POA: Diagnosis not present

## 2015-07-06 DIAGNOSIS — Z23 Encounter for immunization: Secondary | ICD-10-CM | POA: Diagnosis not present

## 2015-07-20 DIAGNOSIS — M797 Fibromyalgia: Secondary | ICD-10-CM | POA: Diagnosis not present

## 2015-07-20 DIAGNOSIS — M542 Cervicalgia: Secondary | ICD-10-CM | POA: Diagnosis not present

## 2015-07-29 DIAGNOSIS — G43109 Migraine with aura, not intractable, without status migrainosus: Secondary | ICD-10-CM | POA: Diagnosis not present

## 2015-07-29 DIAGNOSIS — G894 Chronic pain syndrome: Secondary | ICD-10-CM | POA: Diagnosis not present

## 2015-07-29 DIAGNOSIS — Z79891 Long term (current) use of opiate analgesic: Secondary | ICD-10-CM | POA: Diagnosis not present

## 2015-07-29 DIAGNOSIS — M47812 Spondylosis without myelopathy or radiculopathy, cervical region: Secondary | ICD-10-CM | POA: Diagnosis not present

## 2015-08-04 DIAGNOSIS — M170 Bilateral primary osteoarthritis of knee: Secondary | ICD-10-CM | POA: Diagnosis not present

## 2015-08-10 DIAGNOSIS — M542 Cervicalgia: Secondary | ICD-10-CM | POA: Diagnosis not present

## 2015-08-10 DIAGNOSIS — M797 Fibromyalgia: Secondary | ICD-10-CM | POA: Diagnosis not present

## 2015-08-11 DIAGNOSIS — M170 Bilateral primary osteoarthritis of knee: Secondary | ICD-10-CM | POA: Diagnosis not present

## 2015-08-16 ENCOUNTER — Encounter: Payer: Self-pay | Admitting: Internal Medicine

## 2015-08-16 ENCOUNTER — Ambulatory Visit: Payer: Medicare Other | Admitting: Internal Medicine

## 2015-08-16 VITALS — BP 128/72 | HR 67 | Ht 66.0 in | Wt 170.6 lb

## 2015-08-16 DIAGNOSIS — G4733 Obstructive sleep apnea (adult) (pediatric): Secondary | ICD-10-CM

## 2015-08-16 MED ORDER — MODAFINIL 200 MG PO TABS: 100.0000 mg | ORAL_TABLET | Freq: Every day | ORAL | Status: DC | PRN

## 2015-08-16 NOTE — Progress Notes (Signed)
Patient ID: LEZETTE PACIFIC, female    DOB: 08/23/1951, 64 y.o.   MRN: [id number]  HPI 01/31/11- 82 yoF former smoker, followed for OSA, Insomnia, complicated by hx of fibromyalgia and migraine.  Last here June 23, 2010- note reviewed. Continues CPAP 10 w/ trazodone now 3 x 50 mg at bedtime, methadone in the mornings. For a couple of months, sleep has not been restorative and she has felt fatigued. She saw Dr Nicholaus Bloom who got Epworth score 13/24. Says she has trouble with memory and concentration. Remembers past script for provigil, which she never tried.  Sleeps soundly 5-6 hrs, then awake when drugs wear off. Denies moving around much in bed. Aches all over and just as tired when she gets up as when she went to bed. Not told by husband that she is snoring through CPAP. On Disability since 2005- being reassessed. Naps minimally refreshing if she has to take one. 2 cups caf coffee in AM. Thyroid checked annually.   08/03/11-  59 yoF former smoker, followed for OSA, Insomnia, complicated by hx of fibromyalgia and migraine.  She continues to use CPAP all night every night at [facility]. Much less need for afternoon naps. Provigil is being taken for 5 days of each week. By staying awake in the daytime she is now able to sleep better at night. She is satisfied with her care. Still taking trazodone for sleep and methadone 5 mg 3 times a day. We have discussed sedating potential of methadone.  08/01/12-  61 yoF former smoker, followed for OSA, Insomnia, complicated by hx of fibromyalgia and migraine.  FOLLOWS FOR: wears CPAP 10/ [facility] every night about 7 hours; would like to disucuss pressure-increase or not. CPAP definitely helps her using a fullface mask. Sleeps through the night. She is curious to try increasing pressure to see what that would be like. Provigil has been a big help taking one half tablet per day when needed. Treated by Dr. Hardin Negus for fibromyalgia and migraine.  Now on Flexeril which also helps her sleep.  08/01/13- 62 yoF former smoker, followed for OSA, Insomnia, complicated by hx of fibromyalgia and migraine. Follows [id number] CPAP 6-7 hrs /night -Mask fits well -   [facility] : Pt is eligible for a new machine - Patient says the machine is making nose/ sounds different.She continues to wear it every night and it has definitely helped. Residual daytime somnolence is controlled with appropriate use of Provigil when needed.   Wants flu shot - Needs refill on Provigil  08/14/14-  62 yoF former smoker, followed for OSA, Insomnia, complicated by hx of fibromyalgia and migraine. FOLLOW FOR:  OSA; wears CPAP every night approx 7 hours nightly; no complaints CPAP 11/ [facility] She feels she is doing very well with CPAP and pressure is good. Sleeps well. Still needs modafinil every day "I depend on it" for energy because of fibromyalgia. Usually one half tab is sufficient.  08/16/2015-64 year old female former smoker followed for OSA, Insomnia, complicated by history fibromyalgia, migraine CPAP 11/ [facility] FOLLOWS FOR: Wears CPAP every night for about 7 hours; DME is [facility]. No supplies needed at this time.    ROS-see HPI Constitutional:   No-   weight loss, night sweats, fevers, chills, +fatigue, lassitude. HEENT:   No-  headaches, difficulty swallowing, tooth/dental problems, sore throat,       No-  sneezing, itching, ear ache, nasal congestion, post nasal drip,  CV:  No-   chest pain, orthopnea, PND, swelling in lower extremities, anasarca,  dizziness, palpitations Resp: No- acute  shortness of breath with exertion or at rest.              No-   productive cough,  No non-productive cough,  No- coughing up of blood.              No-   change in color of mucus.  No- wheezing.   Skin: No-   rash or lesions. GI:  No-   heartburn, indigestion, abdominal pain, nausea, vomiting,  GU:  MS:  No-   joint pain or swelling.   Neuro-      nothing unusual Psych:  No- change in mood or affect. No depression or anxiety.  No memory loss.   Objective:   Physical Exam General- Alert, Oriented, Affect-normal, Distress- none acute; medium build Skin- rash-none, lesions- none, excoriation- none Lymphadenopathy- none Head- atraumatic            Eyes- Gross vision intact, PERRLA, conjunctivae clear secretions; periorbital edema            Ears- Hearing, canals-normal            Nose- Clear, no-Septal dev, mucus, polyps, erosion, perforation             Throat- Mallampati III-IV , mucosa clear , drainage- none, tonsils- atrophic Neck- flexible , trachea midline, no stridor , thyroid nl, carotid no bruit Chest - symmetrical excursion , unlabored           Heart/CV- RRR , no murmur , no gallop  , no rub, nl s1 s2                           - JVD- none , edema- none, stasis changes- none, varices- none           Lung- clear to P&A, wheeze- none, cough- none , dullness-none, rub- none           Chest wall-  Abd-  Br/ Gen/ Rectal- Not done, not indicated Extrem- cyanosis- none, clubbing, none, atrophy- none, strength- nl Neuro- grossly intact to observation

## 2015-08-16 NOTE — Patient Instructions (Signed)
Order-DME-[facility] please increased pressure to 12 CWP  DX OSA  We are increasing your pressure just a little. Please let us know if it is uncomfortable

## 2015-08-25 DIAGNOSIS — M170 Bilateral primary osteoarthritis of knee: Secondary | ICD-10-CM | POA: Diagnosis not present

## 2015-09-15 DIAGNOSIS — M170 Bilateral primary osteoarthritis of knee: Secondary | ICD-10-CM | POA: Diagnosis not present

## 2015-09-16 DIAGNOSIS — M797 Fibromyalgia: Secondary | ICD-10-CM | POA: Diagnosis not present

## 2015-09-16 DIAGNOSIS — M542 Cervicalgia: Secondary | ICD-10-CM | POA: Diagnosis not present

## 2015-09-22 DIAGNOSIS — M170 Bilateral primary osteoarthritis of knee: Secondary | ICD-10-CM | POA: Diagnosis not present

## 2015-10-04 ENCOUNTER — Other Ambulatory Visit: Payer: Self-pay

## 2015-10-04 DIAGNOSIS — Z1231 Encounter for screening mammogram for malignant neoplasm of breast: Secondary | ICD-10-CM

## 2015-10-08 DIAGNOSIS — Z79891 Long term (current) use of opiate analgesic: Secondary | ICD-10-CM | POA: Diagnosis not present

## 2015-10-08 DIAGNOSIS — G894 Chronic pain syndrome: Secondary | ICD-10-CM | POA: Diagnosis not present

## 2015-10-08 DIAGNOSIS — G43109 Migraine with aura, not intractable, without status migrainosus: Secondary | ICD-10-CM | POA: Diagnosis not present

## 2015-10-08 DIAGNOSIS — M47812 Spondylosis without myelopathy or radiculopathy, cervical region: Secondary | ICD-10-CM | POA: Diagnosis not present

## 2015-10-20 ENCOUNTER — Ambulatory Visit
Admission: RE | Admit: 2015-10-20 | Discharge: 2015-10-20 | Disposition: A | Discharge status: Home / Self Care | Payer: Medicare Other | Source: Ambulatory Visit

## 2015-10-20 DIAGNOSIS — Z1231 Encounter for screening mammogram for malignant neoplasm of breast: Secondary | ICD-10-CM

## 2015-10-21 ENCOUNTER — Other Ambulatory Visit: Payer: Medicare Other | Admitting: Internal Medicine

## 2015-10-21 DIAGNOSIS — M8580 Other specified disorders of bone density and structure, unspecified site: Secondary | ICD-10-CM

## 2015-10-21 DIAGNOSIS — Z1329 Encounter for screening for other suspected endocrine disorder: Secondary | ICD-10-CM

## 2015-10-21 DIAGNOSIS — Z130 Encounter for screening for diseases of the blood and blood-forming organs and certain disorders involving the immune mechanism: Secondary | ICD-10-CM

## 2015-10-21 DIAGNOSIS — G4730 Sleep apnea, unspecified: Secondary | ICD-10-CM | POA: Diagnosis not present

## 2015-10-21 DIAGNOSIS — Z0000 Encounter for general adult medical examination without abnormal findings: Secondary | ICD-10-CM | POA: Diagnosis not present

## 2015-10-21 DIAGNOSIS — Z13220 Encounter for screening for lipoid disorders: Secondary | ICD-10-CM

## 2015-10-21 DIAGNOSIS — M797 Fibromyalgia: Secondary | ICD-10-CM | POA: Diagnosis not present

## 2015-10-21 DIAGNOSIS — E559 Vitamin D deficiency, unspecified: Secondary | ICD-10-CM | POA: Diagnosis not present

## 2015-10-21 DIAGNOSIS — G43909 Migraine, unspecified, not intractable, without status migrainosus: Secondary | ICD-10-CM | POA: Diagnosis not present

## 2015-10-21 LAB — CBC WITH DIFFERENTIAL/PLATELET
Basophils Absolute: 0 10*3/uL (ref 0.0–0.1)
Basophils Relative: 1 % (ref 0–1)
Eosinophils Absolute: 0.1 10*3/uL (ref 0.0–0.7)
Eosinophils Relative: 2 % (ref 0–5)
HCT: 41.8 % (ref 36.0–46.0)
HEMOGLOBIN: 13.7 g/dL (ref 12.0–15.0)
Lymphocytes Relative: 43 % (ref 12–46)
Lymphs Abs: 1.6 10*3/uL (ref 0.7–4.0)
MCH: 29.5 pg (ref 26.0–34.0)
MCHC: 32.8 g/dL (ref 30.0–36.0)
MCV: 89.9 fL (ref 78.0–100.0)
MPV: 10 fL (ref 8.6–12.4)
Monocytes Absolute: 0.3 10*3/uL (ref 0.1–1.0)
Monocytes Relative: 9 % (ref 3–12)
NEUTROS ABS: 1.7 10*3/uL (ref 1.7–7.7)
NEUTROS PCT: 45 % (ref 43–77)
Platelets: 214 10*3/uL (ref 150–400)
RBC: 4.65 MIL/uL (ref 3.87–5.11)
RDW: 14 % (ref 11.5–15.5)
WBC: 3.8 10*3/uL — ABNORMAL LOW (ref 4.0–10.5)

## 2015-10-21 LAB — COMPLETE METABOLIC PANEL WITH GFR
ALBUMIN: 4.2 g/dL (ref 3.6–5.1)
ALK PHOS: 85 U/L (ref 33–130)
ALT: 13 U/L (ref 6–29)
AST: 19 U/L (ref 10–35)
BILIRUBIN TOTAL: 0.7 mg/dL (ref 0.2–1.2)
BUN: 18 mg/dL (ref 7–25)
CO2: 28 mmol/L (ref 20–31)
Calcium: 9.4 mg/dL (ref 8.6–10.4)
Chloride: 103 mmol/L (ref 98–110)
Creat: 0.68 mg/dL (ref 0.50–0.99)
GLUCOSE: 69 mg/dL (ref 65–99)
Potassium: 4.5 mmol/L (ref 3.5–5.3)
Sodium: 142 mmol/L (ref 135–146)
TOTAL PROTEIN: 6.6 g/dL (ref 6.1–8.1)

## 2015-10-21 LAB — TSH: TSH: 2.84 m[IU]/L

## 2015-10-21 LAB — LIPID PANEL
Cholesterol: 181 mg/dL (ref 125–200)
HDL: 82 mg/dL (ref >=46)
LDL Cholesterol: 89 mg/dL (ref <130)
TRIGLYCERIDES: 49 mg/dL (ref <150)
Total CHOL/HDL Ratio: 2.2 Ratio (ref <=5.0)
VLDL: 10 mg/dL (ref <30)

## 2015-10-22 LAB — VITAMIN D 25 HYDROXY (VIT D DEFICIENCY, FRACTURES): Vit D, 25-Hydroxy: 37 ng/mL (ref 30–100)

## 2015-10-25 ENCOUNTER — Other Ambulatory Visit: Payer: Self-pay | Admitting: Internal Medicine

## 2015-10-25 DIAGNOSIS — R928 Other abnormal and inconclusive findings on diagnostic imaging of breast: Secondary | ICD-10-CM

## 2015-10-28 ENCOUNTER — Encounter: Payer: Self-pay | Admitting: Internal Medicine

## 2015-10-28 ENCOUNTER — Ambulatory Visit (INDEPENDENT_AMBULATORY_CARE_PROVIDER_SITE_OTHER): Payer: Medicare Other | Admitting: Internal Medicine

## 2015-10-28 VITALS — BP 124/82 | HR 74 | Temp 98.4°F | Resp 20 | Ht 66.0 in | Wt 174.0 lb

## 2015-10-28 DIAGNOSIS — K5900 Constipation, unspecified: Secondary | ICD-10-CM

## 2015-10-28 DIAGNOSIS — Z8669 Personal history of other diseases of the nervous system and sense organs: Secondary | ICD-10-CM

## 2015-10-28 DIAGNOSIS — M170 Bilateral primary osteoarthritis of knee: Secondary | ICD-10-CM

## 2015-10-28 DIAGNOSIS — M797 Fibromyalgia: Secondary | ICD-10-CM | POA: Diagnosis not present

## 2015-10-28 DIAGNOSIS — Z0000 Encounter for general adult medical examination without abnormal findings: Secondary | ICD-10-CM | POA: Diagnosis not present

## 2015-10-28 MED ORDER — HYOSCYAMINE SULFATE 0.125 MG SL SUBL: 0.1250 mg | SUBLINGUAL_TABLET | SUBLINGUAL | Status: DC | PRN

## 2015-10-28 NOTE — Patient Instructions (Addendum)
Try Miralax twice daily and stop Probiotics. May need Amitiza. It was a pleasure to see you today.

## 2015-10-28 NOTE — Progress Notes (Signed)
Subjective:    Patient ID: Kari Flynn, female    DOB: [birth year], 65 y.o.   MRN: [id number]  HPI 65 year old White Female for health maintenance exam and evaluation of medical issues. Patient is on disability for history of migraine headaches and fibromyalgia syndrome. Has had significant issues with constipation aggravated by pain medication. She is on chronic methadone therapy. She might be a candidate for Movantik want her to try MiraLAX on a daily basis. She has a history of fibromyalgia and knee arthritis followed by Dr. Waldemar Dickens, rheumatologist. She has knee injections on a when necessary basis. History of sleep apnea and uses C Pap machine.  Past medical history: D&C 1979, bilateral tubal ligation 1987, bunionectomy 1990, benign right breast biopsy February 1995.  She is on Provigil, Flexeril, methadone, Relpax and Frova.  Social history: She used to work as a Equities trader. Married with 2 adult sons. Quit smoking in 1979 and formerly smoked pack of cigarettes daily for 10 years. May consume 1 ounce of alcohol weekly at most.  Had colonoscopy 2015 by Dr. Olevia Perches for right lower quadrant abdominal pain. This was normal.  Had infected laceration right leg Summer of 2015.  Family history: Brother had MI with history of diabetes and now has chronic kidney disease after MI. Mother died 09-16-2013 of congestive heart failure. Father died of multiple myeloma. Mother had hypertension and gout.    Review of Systems  main complaint is constipation.        Objective:   Physical Exam  Constitutional: She is oriented to person, place, and time. She appears well-developed and well-nourished. No distress.  HENT:  Head: Normocephalic and atraumatic.  Right Ear: External ear normal.  Left Ear: External ear normal.  Mouth/Throat: Oropharynx is clear and moist. No oropharyngeal exudate.  Eyes: Conjunctivae and EOM are normal. Pupils are equal, round, and reactive to light. Right  eye exhibits no discharge. Left eye exhibits no discharge. No scleral icterus.  Neck: Neck supple. No JVD present. No thyromegaly present.  Cardiovascular: Normal rate, regular rhythm, normal heart sounds and intact distal pulses.   No murmur heard. Pulmonary/Chest: Effort normal and breath sounds normal. No respiratory distress. She has no wheezes. She has no rales. She exhibits no tenderness.  Breast normal female  Abdominal: Soft. Bowel sounds are normal. She exhibits no distension and no mass. There is no tenderness. There is no rebound and no guarding.  Genitourinary:  Pap done 2015. Bimanual normal.  Musculoskeletal: She exhibits no edema.  Lymphadenopathy:    She has no cervical adenopathy.  Neurological: She is alert and oriented to person, place, and time. She has normal reflexes. No cranial nerve deficit. Coordination normal.  Skin: Skin is warm and dry. No rash noted. She is not diaphoretic.  Psychiatric: She has a normal mood and affect. Her behavior is normal. Judgment and thought content normal.  Vitals reviewed.         Assessment & Plan:  Constipation- may need Amitiza if Miralax does not work Migraine headaches Osteoarthritis Abnormal mammogram-Further studies pending  Plan: May be candidate for Amitiza  or Movantik if MiraLAX does not work  Subjective:   Patient presents for Medicare Annual/Subsequent preventive examination.  Review Past Medical/Family/Social:See above   Risk Factors  Current exercise habits: Sedentary due to knee pain and fibromyalgia Dietary issues discussed: Low fat low carbohydrate  Cardiac risk factors:Family history  Depression Screen  (Note: if answer to either of the following is "Yes", a  more complete depression screening is indicated)   Over the past two weeks, have you felt down, depressed or hopeless? No  Over the past two weeks, have you felt little interest or pleasure in doing things? No Have you lost interest or  pleasure in daily life? No Do you often feel hopeless? No Do you cry easily over simple problems? No   Activities of Daily Living  In your present state of health, do you have any difficulty performing the following activities?:   Driving? No  Managing money? No  Feeding yourself? No  Getting from bed to chair? No  Climbing a flight of stairs? No  Preparing food and eating?: No  Bathing or showering? No  Getting dressed: No  Getting to the toilet? No  Using the toilet:No  Moving around from place to place: No  In the past year have you fallen or had a near fall?:No  Are you sexually active? No  Do you have more than one partner? No   Hearing Difficulties: No  Do you often ask people to speak up or repeat themselves? No  Do you experience ringing or noises in your ears? No  Do you have difficulty understanding soft or whispered voices? No  Do you feel that you have a problem with memory? No Do you often misplace items? No    Home Safety:  Do you have a smoke alarm at your residence? Yes Do you have grab bars in the bathroom? No Do you have throw rugs in your house? Yes   Cognitive Testing  Alert? Yes Normal Appearance?Yes  Oriented to person? Yes Place? Yes  Time? Yes  Recall of three objects? Yes  Can perform simple calculations? Yes  Displays appropriate judgment?Yes  Can read the correct time from a watch face?Yes   List the Names of Other Physician/Practitioners you currently use:  See referral list for the physicians patient is currently seeing.  Dr. Nicholaus Bloom, Dr. Estanislado Pandy   Review of Systems: See above   Objective:     General appearance: Appears stated age and mildly obese  Head: Normocephalic, without obvious abnormality, atraumatic  Eyes: conj clear, EOMi PEERLA  Ears: normal TM's and external ear canals both ears  Nose: Nares normal. Septum midline. Mucosa normal. No drainage or sinus tenderness.  Throat: lips, mucosa, and tongue normal;  teeth and gums normal  Neck: no adenopathy, no carotid bruit, no JVD, supple, symmetrical, trachea midline and thyroid not enlarged, symmetric, no tenderness/mass/nodules  No CVA tenderness.  Lungs: clear to auscultation bilaterally  Breasts: normal appearance, no masses or tenderness Heart: regular rate and rhythm, S1, S2 normal, no murmur, click, rub or gallop  Abdomen: soft, non-tender; bowel sounds normal; no masses, no organomegaly  Musculoskeletal: ROM normal in all joints, no crepitus, no deformity, Normal muscle strengthen. Back  is symmetric, no curvature. Skin: Skin color, texture, turgor normal. No rashes or lesions  Lymph nodes: Cervical, supraclavicular, and axillary nodes normal.  Neurologic: CN 2 -12 Normal, Normal symmetric reflexes. Normal coordination and gait  Psych: Alert & Oriented x 3, Mood appear stable.    Assessment:    Annual wellness medicare exam   Plan:    During the course of the visit the patient was educated and counseled about appropriate screening and preventive services including:  Mammogram further studies pending  Has had colonoscopy with Dr. Olevia Perches  Annual flu vaccine    Patient Instructions (the written plan) was given to the patient.  Medicare Attestation  I have personally reviewed:  The patient's medical and social history  Their use of alcohol, tobacco or illicit drugs  Their current medications and supplements  The patient's functional ability including ADLs,fall risks, home safety risks, cognitive, and hearing and visual impairment  Diet and physical activities  Evidence for depression or mood disorders  The patient's weight, height, BMI, and visual acuity have been recorded in the chart. I have made referrals, counseling, and provided education to the patient based on review of the above and I have provided the patient with a written personalized care plan for preventive services.

## 2015-10-29 ENCOUNTER — Ambulatory Visit
Admission: RE | Admit: 2015-10-29 | Discharge: 2015-10-29 | Disposition: A | Discharge status: Home / Self Care | Payer: Medicare Other | Source: Ambulatory Visit | Attending: Internal Medicine | Admitting: Internal Medicine

## 2015-10-29 DIAGNOSIS — R928 Other abnormal and inconclusive findings on diagnostic imaging of breast: Secondary | ICD-10-CM

## 2015-10-29 DIAGNOSIS — N63 Unspecified lump in breast: Secondary | ICD-10-CM | POA: Diagnosis not present

## 2015-11-01 ENCOUNTER — Other Ambulatory Visit: Payer: Medicare Other

## 2015-11-29 DIAGNOSIS — R5381 Other malaise: Secondary | ICD-10-CM | POA: Diagnosis not present

## 2015-11-29 DIAGNOSIS — M62838 Other muscle spasm: Secondary | ICD-10-CM | POA: Diagnosis not present

## 2015-11-29 DIAGNOSIS — G4709 Other insomnia: Secondary | ICD-10-CM | POA: Diagnosis not present

## 2015-11-29 DIAGNOSIS — M25561 Pain in right knee: Secondary | ICD-10-CM | POA: Diagnosis not present

## 2015-11-29 DIAGNOSIS — M797 Fibromyalgia: Secondary | ICD-10-CM | POA: Diagnosis not present

## 2015-12-03 DIAGNOSIS — Z79891 Long term (current) use of opiate analgesic: Secondary | ICD-10-CM | POA: Diagnosis not present

## 2015-12-03 DIAGNOSIS — G43109 Migraine with aura, not intractable, without status migrainosus: Secondary | ICD-10-CM | POA: Diagnosis not present

## 2015-12-03 DIAGNOSIS — B029 Zoster without complications: Secondary | ICD-10-CM | POA: Diagnosis not present

## 2015-12-03 DIAGNOSIS — G894 Chronic pain syndrome: Secondary | ICD-10-CM | POA: Diagnosis not present

## 2015-12-07 ENCOUNTER — Telehealth: Payer: Self-pay | Admitting: Internal Medicine

## 2015-12-07 ENCOUNTER — Encounter: Payer: Self-pay | Admitting: Internal Medicine

## 2015-12-07 ENCOUNTER — Ambulatory Visit (INDEPENDENT_AMBULATORY_CARE_PROVIDER_SITE_OTHER): Payer: Medicare Other | Admitting: Internal Medicine

## 2015-12-07 VITALS — BP 146/90 | HR 80 | Temp 97.9°F | Resp 20 | Wt 169.0 lb

## 2015-12-07 DIAGNOSIS — R509 Fever, unspecified: Secondary | ICD-10-CM

## 2015-12-07 DIAGNOSIS — J069 Acute upper respiratory infection, unspecified: Secondary | ICD-10-CM

## 2015-12-07 DIAGNOSIS — D72819 Decreased white blood cell count, unspecified: Secondary | ICD-10-CM

## 2015-12-07 DIAGNOSIS — B029 Zoster without complications: Secondary | ICD-10-CM | POA: Diagnosis not present

## 2015-12-07 LAB — CBC WITH DIFFERENTIAL/PLATELET
Basophils Absolute: 0 cells/uL (ref 0–200)
Basophils Relative: 0 %
EOS ABS: 0 {cells}/uL — AB (ref 15–500)
Eosinophils Relative: 0 %
HCT: 41.7 % (ref 35.0–45.0)
Hemoglobin: 14.3 g/dL (ref 11.7–15.5)
Lymphocytes Relative: 44 %
Lymphs Abs: 1188 cells/uL (ref 850–3900)
MCH: 30.5 pg (ref 27.0–33.0)
MCHC: 34.3 g/dL (ref 32.0–36.0)
MCV: 88.9 fL (ref 80.0–100.0)
MONO ABS: 486 {cells}/uL (ref 200–950)
MPV: 9.8 fL (ref 7.5–12.5)
Monocytes Relative: 18 %
NEUTROS ABS: 1026 {cells}/uL — AB (ref 1500–7800)
Neutrophils Relative %: 38 %
Platelets: 172 10*3/uL (ref 140–400)
RBC: 4.69 MIL/uL (ref 3.80–5.10)
RDW: 13.8 % (ref 11.0–15.0)
WBC: 2.7 10*3/uL — AB (ref 3.8–10.8)

## 2015-12-07 LAB — POCT RAPID STREP A (OFFICE): Rapid Strep A Screen: NEGATIVE

## 2015-12-07 MED ORDER — FLUCONAZOLE 150 MG PO TABS: 150.0000 mg | ORAL_TABLET | Freq: Once | ORAL | Status: DC

## 2015-12-07 MED ORDER — AMOXICILLIN-POT CLAVULANATE 500-125 MG PO TABS: 1.0000 | ORAL_TABLET | Freq: Three times a day (TID) | ORAL | Status: DC

## 2015-12-07 NOTE — Telephone Encounter (Signed)
She saw her Neuro (for a routine follow-up on her migraines):  Dr. Nicholaus Bloom last Friday and he diagnosed her with shingles.  He started her on Valtrex 1 gm three times a day.  She states that she has a fever 100-101 since Saturday.  She's taking Tylenol for the fever.  She states that she's hurting and feels awful.  Do you want to see her?  Please advise.    Pharmacy:  [facility] in [place]  Phone # [phone]

## 2015-12-07 NOTE — Telephone Encounter (Signed)
Needs OV.  

## 2015-12-07 NOTE — Telephone Encounter (Signed)
Husband called back; advised that patient would need to be seen.  He's going to bring her this afternoon.   We'll work her in.

## 2015-12-07 NOTE — Patient Instructions (Addendum)
White blood cell count is low. Could be due to viral syndrome. Take Augmentin 500 mg 3 times daily for 10 days with a meal. Diflucan if needed for Candida vaginitis. Have eye exam. Finish Valtrex. Repeat CBC in 4 weeks.

## 2015-12-15 DIAGNOSIS — H21233 Degeneration of iris (pigmentary), bilateral: Secondary | ICD-10-CM | POA: Diagnosis not present

## 2015-12-15 DIAGNOSIS — D3132 Benign neoplasm of left choroid: Secondary | ICD-10-CM | POA: Diagnosis not present

## 2015-12-15 DIAGNOSIS — H5213 Myopia, bilateral: Secondary | ICD-10-CM | POA: Diagnosis not present

## 2015-12-15 DIAGNOSIS — H2513 Age-related nuclear cataract, bilateral: Secondary | ICD-10-CM | POA: Diagnosis not present

## 2015-12-26 NOTE — Progress Notes (Signed)
   Subjective:    Patient ID: Kari Flynn, female    DOB: 30-Nov-1950, 65 y.o.   MRN: [id number]  HPI Patient says she was diagnosed recently with shingles affecting her right face. Was prescribed Valtrex. Has not seen ophthalmologist. We will get an appointment for her since rash is near her right eye. Also has come down with apparent upper respiratory infection. Fever and sore throat. Has had cough and congestion. Has malaise and fatigue.    Review of Systems see above     Objective:   Physical Exam Pharynx slightly injected. No exudate. Rapid strep screen negative. CBC with differential is within normal limits. TMs are clear. Neck is supple. Chest clear to auscultation without rales or wheezing       Assessment & Plan:  Herpes zoster right face-have ophthalmologist see her regarding involvement and right eye  Acute URI  Leukopenia-white blood cell count 2700. Asked patient to have repeat CBC without office visit in 4 weeks. Low white blood cell count could be due to viral syndrome. Previous white blood cell count was 3800.  Plan: Augmentin 500 mg 3 times daily for 10 days. Finish course of Valtrex previously prescribed by another physician. Have ophthalmology exam. Tylenol as needed for fever. Diflucan if needed for Candida vaginitis symptoms.

## 2016-01-11 ENCOUNTER — Other Ambulatory Visit: Payer: Medicare Other | Admitting: Internal Medicine

## 2016-01-11 DIAGNOSIS — D72819 Decreased white blood cell count, unspecified: Secondary | ICD-10-CM

## 2016-01-11 LAB — CBC WITH DIFFERENTIAL/PLATELET
BASOS PCT: 1 %
Basophils Absolute: 53 cells/uL (ref 0–200)
Eosinophils Absolute: 106 cells/uL (ref 15–500)
Eosinophils Relative: 2 %
HCT: 41.7 % (ref 35.0–45.0)
Hemoglobin: 14 g/dL (ref 11.7–15.5)
LYMPHS PCT: 21 %
Lymphs Abs: 1113 cells/uL (ref 850–3900)
MCH: 30 pg (ref 27.0–33.0)
MCHC: 33.6 g/dL (ref 32.0–36.0)
MCV: 89.3 fL (ref 80.0–100.0)
MONOS PCT: 10 %
MPV: 9.3 fL (ref 7.5–12.5)
Monocytes Absolute: 530 cells/uL (ref 200–950)
Neutro Abs: 3498 cells/uL (ref 1500–7800)
Neutrophils Relative %: 66 %
Platelets: 259 10*3/uL (ref 140–400)
RBC: 4.67 MIL/uL (ref 3.80–5.10)
RDW: 14.3 % (ref 11.0–15.0)
WBC: 5.3 10*3/uL (ref 3.8–10.8)

## 2016-01-12 DIAGNOSIS — M47812 Spondylosis without myelopathy or radiculopathy, cervical region: Secondary | ICD-10-CM | POA: Insufficient documentation

## 2016-01-12 DIAGNOSIS — M1812 Unilateral primary osteoarthritis of first carpometacarpal joint, left hand: Secondary | ICD-10-CM | POA: Insufficient documentation

## 2016-01-28 DIAGNOSIS — M47812 Spondylosis without myelopathy or radiculopathy, cervical region: Secondary | ICD-10-CM | POA: Diagnosis not present

## 2016-01-28 DIAGNOSIS — Z79891 Long term (current) use of opiate analgesic: Secondary | ICD-10-CM | POA: Diagnosis not present

## 2016-01-28 DIAGNOSIS — G894 Chronic pain syndrome: Secondary | ICD-10-CM | POA: Diagnosis not present

## 2016-01-28 DIAGNOSIS — G43109 Migraine with aura, not intractable, without status migrainosus: Secondary | ICD-10-CM | POA: Diagnosis not present

## 2016-03-28 DIAGNOSIS — Z79891 Long term (current) use of opiate analgesic: Secondary | ICD-10-CM | POA: Diagnosis not present

## 2016-03-28 DIAGNOSIS — G894 Chronic pain syndrome: Secondary | ICD-10-CM | POA: Diagnosis not present

## 2016-03-28 DIAGNOSIS — G43109 Migraine with aura, not intractable, without status migrainosus: Secondary | ICD-10-CM | POA: Diagnosis not present

## 2016-03-28 DIAGNOSIS — M47812 Spondylosis without myelopathy or radiculopathy, cervical region: Secondary | ICD-10-CM | POA: Diagnosis not present

## 2016-03-31 DIAGNOSIS — M170 Bilateral primary osteoarthritis of knee: Secondary | ICD-10-CM | POA: Diagnosis not present

## 2016-04-07 DIAGNOSIS — M170 Bilateral primary osteoarthritis of knee: Secondary | ICD-10-CM | POA: Diagnosis not present

## 2016-04-14 DIAGNOSIS — M170 Bilateral primary osteoarthritis of knee: Secondary | ICD-10-CM | POA: Diagnosis not present

## 2016-04-21 DIAGNOSIS — M170 Bilateral primary osteoarthritis of knee: Secondary | ICD-10-CM | POA: Diagnosis not present

## 2016-04-25 ENCOUNTER — Other Ambulatory Visit: Payer: Self-pay | Admitting: Internal Medicine

## 2016-04-25 DIAGNOSIS — N6310 Unspecified lump in the right breast, unspecified quadrant: Secondary | ICD-10-CM

## 2016-04-28 ENCOUNTER — Other Ambulatory Visit: Payer: Self-pay

## 2016-04-28 DIAGNOSIS — M170 Bilateral primary osteoarthritis of knee: Secondary | ICD-10-CM | POA: Diagnosis not present

## 2016-05-02 ENCOUNTER — Ambulatory Visit
Admission: RE | Admit: 2016-05-02 | Discharge: 2016-05-02 | Disposition: A | Discharge status: Home / Self Care | Payer: Medicare Other | Source: Ambulatory Visit | Attending: Internal Medicine | Admitting: Internal Medicine

## 2016-05-02 DIAGNOSIS — N6310 Unspecified lump in the right breast, unspecified quadrant: Secondary | ICD-10-CM

## 2016-05-02 DIAGNOSIS — N63 Unspecified lump in breast: Secondary | ICD-10-CM | POA: Diagnosis not present

## 2016-05-04 ENCOUNTER — Telehealth: Payer: Self-pay | Admitting: Internal Medicine

## 2016-05-04 MED ORDER — MODAFINIL 200 MG PO TABS: 100.0000 mg | ORAL_TABLET | Freq: Every day | ORAL | 5 refills | Status: DC | PRN

## 2016-05-04 NOTE — Telephone Encounter (Signed)
Called pt, husband to relay message that Rx has been called into [facility].  Medication has been called into [facility] for Modafinil 200mg  #30 x 5 refills. Nothing further needed.

## 2016-05-04 NOTE — Telephone Encounter (Signed)
Pt is requesting refill on provigil 200 mg Last refilled 07/2015. Take 100 mg by mouth daily as needed. For sleep #30 x 5 refills  Please advise Dr. Annamaria Boots thanks

## 2016-05-04 NOTE — Telephone Encounter (Signed)
Ok to refill. Please correct the sig to say "for sleepiness"                                    ( This is a stimulant)

## 2016-05-08 ENCOUNTER — Other Ambulatory Visit: Payer: Self-pay | Admitting: *Deleted

## 2016-05-08 MED ORDER — MODAFINIL 200 MG PO TABS: 100.0000 mg | ORAL_TABLET | Freq: Every day | ORAL | 5 refills | Status: DC | PRN

## 2016-05-08 NOTE — Telephone Encounter (Signed)
Received fax from [facility] to refill Modafinil Refilled for 30 tablets with 5 refills. Faxed back to pharmacy. Documented in patient's chart. Nothing further needed.

## 2016-05-30 ENCOUNTER — Ambulatory Visit (INDEPENDENT_AMBULATORY_CARE_PROVIDER_SITE_OTHER): Payer: Medicare Other | Admitting: Rheumatology

## 2016-05-30 DIAGNOSIS — R5381 Other malaise: Secondary | ICD-10-CM | POA: Diagnosis not present

## 2016-05-30 DIAGNOSIS — M797 Fibromyalgia: Secondary | ICD-10-CM

## 2016-05-30 DIAGNOSIS — G4709 Other insomnia: Secondary | ICD-10-CM

## 2016-05-30 DIAGNOSIS — M170 Bilateral primary osteoarthritis of knee: Secondary | ICD-10-CM

## 2016-05-30 DIAGNOSIS — M79671 Pain in right foot: Secondary | ICD-10-CM | POA: Diagnosis not present

## 2016-05-30 DIAGNOSIS — M79672 Pain in left foot: Secondary | ICD-10-CM

## 2016-06-01 DIAGNOSIS — Z79891 Long term (current) use of opiate analgesic: Secondary | ICD-10-CM | POA: Diagnosis not present

## 2016-06-01 DIAGNOSIS — G894 Chronic pain syndrome: Secondary | ICD-10-CM | POA: Diagnosis not present

## 2016-06-01 DIAGNOSIS — G43109 Migraine with aura, not intractable, without status migrainosus: Secondary | ICD-10-CM | POA: Diagnosis not present

## 2016-06-01 DIAGNOSIS — M47812 Spondylosis without myelopathy or radiculopathy, cervical region: Secondary | ICD-10-CM | POA: Diagnosis not present

## 2016-06-06 DIAGNOSIS — Z23 Encounter for immunization: Secondary | ICD-10-CM | POA: Diagnosis not present

## 2016-07-17 DIAGNOSIS — M47812 Spondylosis without myelopathy or radiculopathy, cervical region: Secondary | ICD-10-CM | POA: Diagnosis not present

## 2016-07-17 DIAGNOSIS — M1812 Unilateral primary osteoarthritis of first carpometacarpal joint, left hand: Secondary | ICD-10-CM | POA: Diagnosis not present

## 2016-07-31 DIAGNOSIS — M47812 Spondylosis without myelopathy or radiculopathy, cervical region: Secondary | ICD-10-CM | POA: Diagnosis not present

## 2016-07-31 DIAGNOSIS — G43109 Migraine with aura, not intractable, without status migrainosus: Secondary | ICD-10-CM | POA: Diagnosis not present

## 2016-07-31 DIAGNOSIS — Z79891 Long term (current) use of opiate analgesic: Secondary | ICD-10-CM | POA: Diagnosis not present

## 2016-07-31 DIAGNOSIS — G894 Chronic pain syndrome: Secondary | ICD-10-CM | POA: Diagnosis not present

## 2016-08-15 ENCOUNTER — Ambulatory Visit (INDEPENDENT_AMBULATORY_CARE_PROVIDER_SITE_OTHER): Payer: Medicare Other | Admitting: Internal Medicine

## 2016-08-15 ENCOUNTER — Encounter: Payer: Self-pay | Admitting: Internal Medicine

## 2016-08-15 VITALS — BP 130/82 | HR 70 | Ht 66.0 in | Wt 171.6 lb

## 2016-08-15 DIAGNOSIS — G4733 Obstructive sleep apnea (adult) (pediatric): Secondary | ICD-10-CM

## 2016-08-15 DIAGNOSIS — G4730 Sleep apnea, unspecified: Secondary | ICD-10-CM | POA: Diagnosis not present

## 2016-08-15 DIAGNOSIS — G4710 Hypersomnia, unspecified: Secondary | ICD-10-CM

## 2016-08-15 DIAGNOSIS — F5101 Primary insomnia: Secondary | ICD-10-CM | POA: Diagnosis not present

## 2016-08-15 NOTE — Progress Notes (Signed)
   Patient ID: Kari Flynn, female    DOB: 06/28/1951, 65 y.o.   MRN: [id number]  HPI F former smoker, followed for OSA, Insomnia, complicated by hx of fibromyalgia and migraine.   ------------------------------------------------------------  08/16/2015-65 year old female former smoker followed for OSA, Insomnia, complicated by history fibromyalgia, migraine CPAP 11/ [facility] FOLLOWS FOR: Wears CPAP every night for about 7 hours; DME is [facility]. No supplies needed at this time.  08/15/2016-65 year old female former smoker followed for OSA, insomnia, complicated by history fibromyalgia, migraine CPAP 11/[facility] FOLLOWS FOR: Wears CPAP nightly. Denies problems with mask/pressure. DME: [facility]. "Can't sleep without CPAP". Very comfortable with pressure, fullface mask and homecare company. No download available. Says Provigil also was a big help with her fibromyalgia. Daytime sleepiness controlled. Occasional nap.  ROS-see HPI Constitutional:   No-   weight loss, night sweats, fevers, chills, +fatigue, lassitude. HEENT:   No-  headaches, difficulty swallowing, tooth/dental problems, sore throat,       No-  sneezing, itching, ear ache, nasal congestion, post nasal drip,  CV:  No-   chest pain, orthopnea, PND, swelling in lower extremities, anasarca,  dizziness, palpitations Resp: No- acute  shortness of breath with exertion or at rest.              No-   productive cough,  No non-productive cough,  No- coughing up of blood.              No-   change in color of mucus.  No- wheezing.   Skin: No-   rash or lesions. GI:  No-   heartburn, indigestion, abdominal pain, nausea, vomiting,  GU:  MS:  No-   joint pain or swelling.   Neuro-     nothing unusual Psych:  No- change in mood or affect. No depression or anxiety.  No memory loss.   Objective:   Physical Exam General- Alert, Oriented, Affect-normal, Distress- none acute; medium build Skin- rash-none, lesions-  none, excoriation- none Lymphadenopathy- none Head- atraumatic            Eyes- Gross vision intact, PERRLA, conjunctivae clear secretions; periorbital edema+ mild            Ears- Hearing, canals-normal            Nose- Clear, no-Septal dev, mucus, polyps, erosion, perforation             Throat- Mallampati III-IV , mucosa clear , drainage- none, tonsils- atrophic Neck- flexible , trachea midline, no stridor , thyroid nl, carotid no bruit Chest - symmetrical excursion , unlabored           Heart/CV- RRR , no murmur , no gallop  , no rub, nl s1 s2                           - JVD- none , edema- none, stasis changes- none, varices- none           Lung- clear to P&A, wheeze- none, cough- none , dullness-none, rub- none           Chest wall-  Abd-  Br/ Gen/ Rectal- Not done, not indicated Extrem- cyanosis- none, clubbing, none, atrophy- none, strength- nl Neuro- grossly intact to observation

## 2016-08-15 NOTE — Patient Instructions (Signed)
Ok to call for Provigil refill when needed  Order- DME continue CPAP 11/ [facility], mask of choice, humidifier, supplies, [employer]View    Dx OSA                        Please send download for pressure compliance documentation.   Please call if we can help

## 2016-08-15 NOTE — Assessment & Plan Note (Signed)
Residual hypersomnia without cataplexy, despite reported good sleep habits and consistent use of CPAP. She feels significant benefit from Provigil used as prescribed. Plan-continue Provigil, call for prescription refill when needed

## 2016-08-15 NOTE — Assessment & Plan Note (Signed)
She continues to describe excellent compliance and control, saying life is very much better with CPAP. If she does fall asleep without it she is uncomfortable and much more fatigued. No changes required. Compliance goals reviewed. Plan-requesting download for documentation

## 2016-08-15 NOTE — Assessment & Plan Note (Signed)
Primary insomnia. Medications reviewed. Sleep habits discussed with her. No significant changes to offer, continue current treatment.

## 2016-08-24 ENCOUNTER — Encounter: Payer: Self-pay | Admitting: Internal Medicine

## 2016-09-28 DIAGNOSIS — G43109 Migraine with aura, not intractable, without status migrainosus: Secondary | ICD-10-CM | POA: Diagnosis not present

## 2016-09-28 DIAGNOSIS — Z79891 Long term (current) use of opiate analgesic: Secondary | ICD-10-CM | POA: Diagnosis not present

## 2016-09-28 DIAGNOSIS — G894 Chronic pain syndrome: Secondary | ICD-10-CM | POA: Diagnosis not present

## 2016-09-28 DIAGNOSIS — M47812 Spondylosis without myelopathy or radiculopathy, cervical region: Secondary | ICD-10-CM | POA: Diagnosis not present

## 2016-10-27 ENCOUNTER — Other Ambulatory Visit: Payer: Medicare Other | Admitting: Internal Medicine

## 2016-10-27 DIAGNOSIS — Z13220 Encounter for screening for lipoid disorders: Secondary | ICD-10-CM | POA: Diagnosis not present

## 2016-10-27 DIAGNOSIS — Z1321 Encounter for screening for nutritional disorder: Secondary | ICD-10-CM | POA: Diagnosis not present

## 2016-10-27 DIAGNOSIS — Z1329 Encounter for screening for other suspected endocrine disorder: Secondary | ICD-10-CM | POA: Diagnosis not present

## 2016-10-27 DIAGNOSIS — Z0000 Encounter for general adult medical examination without abnormal findings: Secondary | ICD-10-CM | POA: Diagnosis not present

## 2016-10-27 LAB — LIPID PANEL
Cholesterol: 180 mg/dL (ref <200)
HDL: 81 mg/dL (ref >50)
LDL Cholesterol: 85 mg/dL (ref <100)
Total CHOL/HDL Ratio: 2.2 Ratio (ref <5.0)
Triglycerides: 72 mg/dL (ref <150)
VLDL: 14 mg/dL (ref <30)

## 2016-10-27 LAB — CBC WITH DIFFERENTIAL/PLATELET
BASOS PCT: 1 %
Basophils Absolute: 38 cells/uL (ref 0–200)
Eosinophils Absolute: 114 cells/uL (ref 15–500)
Eosinophils Relative: 3 %
HEMATOCRIT: 41.8 % (ref 35.0–45.0)
Hemoglobin: 13.9 g/dL (ref 11.7–15.5)
LYMPHS ABS: 1596 {cells}/uL (ref 850–3900)
LYMPHS PCT: 42 %
MCH: 30.6 pg (ref 27.0–33.0)
MCHC: 33.3 g/dL (ref 32.0–36.0)
MCV: 92.1 fL (ref 80.0–100.0)
MONO ABS: 342 {cells}/uL (ref 200–950)
MPV: 10 fL (ref 7.5–12.5)
Monocytes Relative: 9 %
NEUTROS ABS: 1710 {cells}/uL (ref 1500–7800)
Neutrophils Relative %: 45 %
Platelets: 194 10*3/uL (ref 140–400)
RBC: 4.54 MIL/uL (ref 3.80–5.10)
RDW: 13.6 % (ref 11.0–15.0)
WBC: 3.8 10*3/uL (ref 3.8–10.8)

## 2016-10-27 LAB — COMPREHENSIVE METABOLIC PANEL
ALK PHOS: 85 U/L (ref 33–130)
ALT: 13 U/L (ref 6–29)
AST: 18 U/L (ref 10–35)
Albumin: 4.1 g/dL (ref 3.6–5.1)
BILIRUBIN TOTAL: 1 mg/dL (ref 0.2–1.2)
BUN: 22 mg/dL (ref 7–25)
CALCIUM: 9.4 mg/dL (ref 8.6–10.4)
CO2: 28 mmol/L (ref 20–31)
Chloride: 103 mmol/L (ref 98–110)
Creat: 0.68 mg/dL (ref 0.50–0.99)
GLUCOSE: 79 mg/dL (ref 65–99)
Potassium: 4.5 mmol/L (ref 3.5–5.3)
Sodium: 141 mmol/L (ref 135–146)
TOTAL PROTEIN: 6.6 g/dL (ref 6.1–8.1)

## 2016-10-27 LAB — TSH: TSH: 3.47 m[IU]/L

## 2016-10-28 LAB — VITAMIN D 25 HYDROXY (VIT D DEFICIENCY, FRACTURES): VIT D 25 HYDROXY: 33 ng/mL (ref 30–100)

## 2016-10-31 ENCOUNTER — Ambulatory Visit (INDEPENDENT_AMBULATORY_CARE_PROVIDER_SITE_OTHER): Payer: Medicare Other | Admitting: Internal Medicine

## 2016-10-31 ENCOUNTER — Other Ambulatory Visit (HOSPITAL_COMMUNITY)
Admission: RE | Admit: 2016-10-31 | Discharge: 2016-10-31 | Disposition: A | Discharge status: Home / Self Care | Payer: Medicare Other | Source: Ambulatory Visit | Attending: Internal Medicine | Admitting: Internal Medicine

## 2016-10-31 VITALS — BP 130/84 | HR 73 | Temp 98.0°F | Ht 65.25 in | Wt 172.0 lb

## 2016-10-31 DIAGNOSIS — M170 Bilateral primary osteoarthritis of knee: Secondary | ICD-10-CM

## 2016-10-31 DIAGNOSIS — M797 Fibromyalgia: Secondary | ICD-10-CM | POA: Diagnosis not present

## 2016-10-31 DIAGNOSIS — Z124 Encounter for screening for malignant neoplasm of cervix: Secondary | ICD-10-CM

## 2016-10-31 DIAGNOSIS — Z8669 Personal history of other diseases of the nervous system and sense organs: Secondary | ICD-10-CM

## 2016-10-31 DIAGNOSIS — Z0000 Encounter for general adult medical examination without abnormal findings: Secondary | ICD-10-CM

## 2016-10-31 DIAGNOSIS — T402X5A Adverse effect of other opioids, initial encounter: Secondary | ICD-10-CM

## 2016-10-31 DIAGNOSIS — K5903 Drug induced constipation: Secondary | ICD-10-CM

## 2016-10-31 DIAGNOSIS — G4733 Obstructive sleep apnea (adult) (pediatric): Secondary | ICD-10-CM

## 2016-10-31 LAB — POCT URINALYSIS DIPSTICK
BILIRUBIN UA: NEGATIVE
GLUCOSE UA: NEGATIVE
KETONES UA: NEGATIVE
Leukocytes, UA: NEGATIVE
NITRITE UA: NEGATIVE
PH UA: 6.5
Protein, UA: NEGATIVE
RBC UA: NEGATIVE
SPEC GRAV UA: 1.015
Urobilinogen, UA: NEGATIVE

## 2016-10-31 MED ORDER — METHOCARBAMOL 500 MG PO TABS: 500.0000 mg | ORAL_TABLET | Freq: Four times a day (QID) | ORAL | 1 refills | Status: DC

## 2016-10-31 NOTE — Progress Notes (Signed)
Subjective:    Patient ID: Kari Flynn, female    DOB: [birth year], 66 y.o.   MRN: [id number]  HPI 66 year old white female in today for health maintenance exam, Medicare wellness exam, evaluation of medical issues. She is on disability for history of migraine headaches and fibromyalgia syndrome. Has had significant issues with constipation aggravated by pain medication history of fibromyalgia and knee arthritis followed by Dr. Estanislado Pandy, rheumatologist. Has knee injections on a when necessary basis. History of sleep apnea and uses C Pap device.  Past medical history: D&C 1979, bilateral tubal ligation 1987, bunionectomy 1990, benign right breast biopsy February 1995.  She is on Provigil, Flexeril, methadone, Relpax and/or Frova.  Social history: She used to work as a Equities trader on the pediatric unit at [employer] married with 2 adult sons. Quit smoking in 1979 and formerly smoked a pack of cigarettes daily for 10 years. May consume 1 ounce of alcohol weekly at most. 2 adult sons, one is in Clinical cytogeneticist.  Colonoscopy 2015 by Dr. Olevia Perches for right lower quadrant abdominal pain. This was normal.  Had infected laceration right leg summer 2015.  Family history: Brother had MI with history of diabetes and now has chronic kidney disease after the MI. Mother died 09-08-13 of congestive heart failure. Father died of multiple myeloma. Mother had hypertension and gout.    Review of Systems  Constitutional: Positive for fatigue.  Respiratory: Negative.        History of sleep apnea  Cardiovascular: Negative.   Gastrointestinal:       Opioid-induced constipation  Musculoskeletal:       Musculoskeletal pain secondary to fibromyalgia. Chronic knee pain due to osteoarthritis  Neurological:       History of migraine headaches       Objective:   Physical Exam  Constitutional: She is oriented to person, place, and time. She appears well-developed and well-nourished. No distress.    HENT:  Head: Normocephalic and atraumatic.  Right Ear: External ear normal.  Left Ear: External ear normal.  Eyes: Conjunctivae and EOM are normal. Pupils are equal, round, and reactive to light. Right eye exhibits no discharge. Left eye exhibits no discharge. No scleral icterus.  Neck: Neck supple. No JVD present. No thyromegaly present.  Cardiovascular: Normal rate, regular rhythm, normal heart sounds and intact distal pulses.   No murmur heard. Pulmonary/Chest: Effort normal and breath sounds normal. No respiratory distress. She has no wheezes. She has no rales.  Breasts normal female with fibrocystic changes noted  Abdominal: Soft. Bowel sounds are normal. She exhibits no distension. There is no tenderness. There is no rebound and no guarding.  Genitourinary:  Genitourinary Comments: Pap done. Bimanual normal. Pap does not need to be repeated due to age  Musculoskeletal: She exhibits no edema.  Lymphadenopathy:    She has no cervical adenopathy.  Neurological: She is alert and oriented to person, place, and time. She has normal reflexes. No cranial nerve deficit.  Skin: Skin is warm and dry. No rash noted. She is not diaphoretic.  Psychiatric: She has a normal mood and affect. Her behavior is normal. Judgment normal.  Vitals reviewed.         Assessment & Plan:  Fibromyalgia syndrome  Chronic pain management done through [facility].  History of migraine headaches-stable  Primary osteoarthritis both knees-handled by injections with rheumatologist  Opioid-induced constipation  Plan: Continue same medications and return in one year or as needed. Have annual mammogram and  flu vaccine Subjective:   Patient presents for Medicare Annual/Subsequent preventive examination.  Review Past Medical/Family/Social:   Risk Factors  Current exercise habits: Not able to exercise much due to fibromyalgia and osteoarthritis and knee Dietary issues discussed: Low fat low  carbohydrate  Cardiac risk factors:family hx  Depression Screen  (Note: if answer to either of the following is "Yes", a more complete depression screening is indicated)   Over the past two weeks, have you felt down, depressed or hopeless? No  Over the past two weeks, have you felt little interest or pleasure in doing things? No Have you lost interest or pleasure in daily life? No Do you often feel hopeless? No Do you cry easily over simple problems? No   Activities of Daily Living  In your present state of health, do you have any difficulty performing the following activities?:   Driving? No  Managing money? No  Feeding yourself? No  Getting from bed to chair? No  Climbing a flight of stairs? No  Preparing food and eating?: No  Bathing or showering? No  Getting dressed: No  Getting to the toilet? No  Using the toilet:No  Moving around from place to place: No  In the past year have you fallen or had a near fall?:No  Are you sexually active? No  Do you have more than one partner? No   Hearing Difficulties: No  Do you often ask people to speak up or repeat themselves? No  Do you experience ringing or noises in your ears? No  Do you have difficulty understanding soft or whispered voices? No  Do you feel that you have a problem with memory? No Do you often misplace items? No    Home Safety:  Do you have a smoke alarm at your residence? Yes Do you have grab bars in the bathroom?no Do you have throw rugs in your house?yes   Cognitive Testing  Alert? Yes Normal Appearance?Yes  Oriented to person? Yes Place? Yes  Time? Yes  Recall of three objects? Yes  Can perform simple calculations? Yes  Displays appropriate judgment?Yes  Can read the correct time from a watch face?Yes   List the Names of Other Physician/Practitioners you currently use:  See referral list for the physicians patient is currently seeing.  [facility] physician Rheumatologist   Review  of Systems: See above   Objective:     General appearance: Appears stated age and mildly obese  Head: Normocephalic, without obvious abnormality, atraumatic  Eyes: conj clear, EOMi PEERLA  Ears: normal TM's and external ear canals both ears  Nose: Nares normal. Septum midline. Mucosa normal. No drainage or sinus tenderness.  Throat: lips, mucosa, and tongue normal; teeth and gums normal  Neck: no adenopathy, no carotid bruit, no JVD, supple, symmetrical, trachea midline and thyroid not enlarged, symmetric, no tenderness/mass/nodules  No CVA tenderness.  Lungs: clear to auscultation bilaterally  Breasts: normal appearance,Fibrocystic changes noted Heart: regular rate and rhythm, S1, S2 normal, no murmur, click, rub or gallop  Abdomen: soft, non-tender; bowel sounds normal; no masses, no organomegaly  Musculoskeletal: ROM normal in all joints, no crepitus, no deformity, Normal muscle strengthen. Back  is symmetric, no curvature. Skin: Skin color, texture, turgor normal. No rashes or lesions  Lymph nodes: Cervical, supraclavicular, and axillary nodes normal.  Neurologic: CN 2 -12 Normal, Normal symmetric reflexes. Normal coordination and gait  Psych: Alert & Oriented x 3, Mood appear stable.    Assessment:    Annual wellness  medicare exam   Plan:    During the course of the visit the patient was educated and counseled about appropriate screening and preventive services including:   Annual flu vaccine  Annual mammogram     Patient Instructions (the written plan) was given to the patient.  Medicare Attestation  I have personally reviewed:  The patient's medical and social history  Their use of alcohol, tobacco or illicit drugs  Their current medications and supplements  The patient's functional ability including ADLs,fall risks, home safety risks, cognitive, and hearing and visual impairment  Diet and physical activities  Evidence for depression or mood disorders  The  patient's weight, height, BMI, and visual acuity have been recorded in the chart. I have made referrals, counseling, and provided education to the patient based on review of the above and I have provided the patient with a written personalized care plan for preventive services.

## 2016-11-03 LAB — CYTOLOGY - PAP: Diagnosis: NEGATIVE

## 2016-11-08 ENCOUNTER — Other Ambulatory Visit: Payer: Self-pay | Admitting: Internal Medicine

## 2016-11-08 DIAGNOSIS — N63 Unspecified lump in unspecified breast: Secondary | ICD-10-CM

## 2016-11-08 DIAGNOSIS — N6320 Unspecified lump in the left breast, unspecified quadrant: Secondary | ICD-10-CM

## 2016-11-08 HISTORY — DX: Unspecified lump in the left breast, unspecified quadrant: N63.20

## 2016-11-16 ENCOUNTER — Ambulatory Visit
Admission: RE | Admit: 2016-11-16 | Discharge: 2016-11-16 | Disposition: A | Discharge status: Home / Self Care | Payer: Medicare Other | Source: Ambulatory Visit | Attending: Internal Medicine | Admitting: Internal Medicine

## 2016-11-16 ENCOUNTER — Other Ambulatory Visit: Payer: Self-pay | Admitting: Internal Medicine

## 2016-11-16 DIAGNOSIS — N6489 Other specified disorders of breast: Secondary | ICD-10-CM | POA: Diagnosis not present

## 2016-11-16 DIAGNOSIS — N63 Unspecified lump in unspecified breast: Secondary | ICD-10-CM

## 2016-11-16 DIAGNOSIS — R928 Other abnormal and inconclusive findings on diagnostic imaging of breast: Secondary | ICD-10-CM | POA: Diagnosis not present

## 2016-11-24 ENCOUNTER — Encounter: Payer: Self-pay | Admitting: Internal Medicine

## 2016-11-24 NOTE — Patient Instructions (Addendum)
It was pleasure to see you today. Continue same medications and return in one year or as needed. Watch BP.

## 2016-11-28 ENCOUNTER — Ambulatory Visit: Payer: Medicare Other | Admitting: Podiatry

## 2016-11-28 DIAGNOSIS — G894 Chronic pain syndrome: Secondary | ICD-10-CM | POA: Diagnosis not present

## 2016-11-28 DIAGNOSIS — G43109 Migraine with aura, not intractable, without status migrainosus: Secondary | ICD-10-CM | POA: Diagnosis not present

## 2016-11-28 DIAGNOSIS — Z79891 Long term (current) use of opiate analgesic: Secondary | ICD-10-CM | POA: Diagnosis not present

## 2016-11-28 DIAGNOSIS — M47812 Spondylosis without myelopathy or radiculopathy, cervical region: Secondary | ICD-10-CM | POA: Diagnosis not present

## 2016-12-04 NOTE — Progress Notes (Signed)
Office Visit Note  Patient: Kari Flynn             Date of Birth: 1951/05/06           MRN: [id number]             PCP: Elby Showers, MD Referring: Elby Showers, MD Visit Date: 12/13/2016 Occupation: '@GUAROCC'$ @    Subjective:  Pain of the Right Ankle; Pain of the Right Knee; Pain of the Left Knee; Pain of the Right Hip; Pain of the Left Hip; and Follow-up   History of Present Illness: Kari Flynn is a 66 y.o. female   c/o bilateral knee pain again Last injection of hyalgan x 5 (both knees) completedd 04/28/2016.  f-u on fms. Doing well. No change from the last visit.      Activities of Daily Living:  Patient reports morning stiffness for 15 minutes.   Patient Reports nocturnal pain.  Difficulty dressing/grooming: Denies Difficulty climbing stairs: Denies Difficulty getting out of chair: Denies Difficulty using hands for taps, buttons, cutlery, and/or writing: Denies   No Rheumatology ROS completed.   PMFS History:  Patient Active Problem List   Diagnosis Date Noted  . Hypersomnia with sleep apnea 08/14/2014  . Osteoarthritis of both knees 03/22/2014  . Unspecified constipation 10/16/2013  . Insomnia 06/23/2010  . Obstructive sleep apnea 06/25/2008  . MIGRAINE HEADACHE 06/25/2008  . Fibromyalgia 06/25/2008  . Shortness of breath 06/25/2008    Past Medical History:  Diagnosis Date  . Arthritis   . Dyspnea   . Lump of breast, left 11/08/2016   scheduled for a diagnostic/US mammo bilateral and stereo, US aspiration and ductogram.   . Migraine, unspecified, without mention of intractable migraine without mention of status migrainosus   . Myalgia and myositis, unspecified   . Obstructive sleep apnea (adult) (pediatric)     Family History  Problem Relation Age of Onset  . Hypertension Mother   . Arthritis Mother   . Hyperlipidemia Mother   . Melanoma Mother   . Diverticulitis Mother   . Multiple myeloma Father   . Arthritis      sibling  .  Sarcoidosis      sibling-also has NIDDM  . Diabetes Brother   . Sarcoidosis Brother   . Diabetes Paternal Grandfather   . Colon cancer Neg Hx   . Esophageal cancer Neg Hx   . Rectal cancer Neg Hx   . Stomach cancer Neg Hx    Past Surgical History:  Procedure Laterality Date  . BLEPHAROPLASTY    . BREAST BIOPSY     B9  . BUNIONECTOMY     x2  . WISDOM TOOTH EXTRACTION     Social History   Social History Narrative  . No narrative on file     Objective: Vital Signs: BP 136/74   Pulse 78   Resp 16   Wt 170 lb (77.1 kg)   BMI 28.07 kg/m    Physical Exam   Musculoskeletal Exam:  Full range of motion of all joints Grip strength is equal and strong bilaterally For myalgia tender points are 18 out of 18 positive  CDAI Exam: No CDAI exam completed.  No synovitis on examination  Investigation: Findings:  Labs from 10/21/2015 show CBC with diff is normal.  CMP with GFR is normal.  Lipid panel within normal limits.  TSH is normal.  Vitamin D is within normal limits.   05/30/2016 X-rays of bilateral feet, 3 views, show  PIP and DIP joint space narrowing bilaterally.  Bilateral 1st MTP joints spurring.  Patient has hardware to bilateral 1st toe.  Bilateral intertarsal joint space narrowing, no erosions, some mild calcaneal spurring noted.  No fractures noted.       Imaging: Dg Foot 2 Views Right  Result Date: 12/08/2016 Please see detailed radiograph report in office note.  US Breast Ltd Uni Right Inc Axilla  Result Date: 11/16/2016 CLINICAL DATA:  Follow-up of probably benign right breast mass. EXAM: 2D DIGITAL DIAGNOSTIC BILATERAL MAMMOGRAM WITH CAD AND ADJUNCT TOMO ULTRASOUND RIGHT BREAST COMPARISON:  Previous exam(s). ACR Breast Density Category c: The breast tissue is heterogeneously dense, which may obscure small masses. FINDINGS: Mammographically, there are no suspicious masses, areas of architectural distortion or microcalcifications in either breast. There is a  stable focal asymmetry in the right breast lower inner quadrant, posterior depth. Mammographic images were processed with CAD. On physical exam, no suspicious masses are palpated. Targeted ultrasound is performed, showing right breast 8 o'clock 2 cm from the nipple stable predominantly hypoechoic circumscribed mass which measures 0.7 by 0.5 by 0.8 cm. In the right breast 3 o'clock 5 cm from the nipple there is a mixed echogenicity horizontally oriented circumscribed island of breast tissue, which is most consistent with a hamartoma, and is felt to correspond to the mammographic focal asymmetry seen in the lower inner quadrant of the right breast, posterior depth. IMPRESSION: Stable probably benign right breast 8 o'clock subcentimeter mass, for which continued six-month follow-up is recommended. RECOMMENDATION: Diagnostic mammogram and possibly ultrasound of the right breast in 6 months. (Code:FI-R-75M) I have discussed the findings and recommendations with the patient. Results were also provided in writing at the conclusion of the visit. If applicable, a reminder letter will be sent to the patient regarding the next appointment. BI-RADS CATEGORY  3: Probably benign. Electronically Signed   By: Fidela Salisbury M.D.   On: 11/16/2016 12:44   Mm Diag Breast Tomo Bilateral  Result Date: 11/16/2016 CLINICAL DATA:  Follow-up of probably benign right breast mass. EXAM: 2D DIGITAL DIAGNOSTIC BILATERAL MAMMOGRAM WITH CAD AND ADJUNCT TOMO ULTRASOUND RIGHT BREAST COMPARISON:  Previous exam(s). ACR Breast Density Category c: The breast tissue is heterogeneously dense, which may obscure small masses. FINDINGS: Mammographically, there are no suspicious masses, areas of architectural distortion or microcalcifications in either breast. There is a stable focal asymmetry in the right breast lower inner quadrant, posterior depth. Mammographic images were processed with CAD. On physical exam, no suspicious masses are palpated.  Targeted ultrasound is performed, showing right breast 8 o'clock 2 cm from the nipple stable predominantly hypoechoic circumscribed mass which measures 0.7 by 0.5 by 0.8 cm. In the right breast 3 o'clock 5 cm from the nipple there is a mixed echogenicity horizontally oriented circumscribed island of breast tissue, which is most consistent with a hamartoma, and is felt to correspond to the mammographic focal asymmetry seen in the lower inner quadrant of the right breast, posterior depth. IMPRESSION: Stable probably benign right breast 8 o'clock subcentimeter mass, for which continued six-month follow-up is recommended. RECOMMENDATION: Diagnostic mammogram and possibly ultrasound of the right breast in 6 months. (Code:FI-R-75M) I have discussed the findings and recommendations with the patient. Results were also provided in writing at the conclusion of the visit. If applicable, a reminder letter will be sent to the patient regarding the next appointment. BI-RADS CATEGORY  3: Probably benign. Electronically Signed   By: Fidela Salisbury M.D.   On: 11/16/2016 12:44  Speciality Comments: No specialty comments available.    Procedures:  Large Joint Inj Date/Time: 12/13/2016 11:35 AM Performed by: Eliezer Lofts Authorized by: Eliezer Lofts   Consent Given by:  Patient Site marked: the procedure site was marked   Timeout: prior to procedure the correct patient, procedure, and site was verified   Indications:  Pain Location:  Hip Site:  R greater trochanter Prep: patient was prepped and draped in usual sterile fashion   Needle Size:  27 G Approach:  Superior Ultrasound Guidance: No   Fluoroscopic Guidance: No   Arthrogram: No   Medications:  1.5 mL lidocaine 1 %; 2 mL bupivacaine 0.25 % Aspiration Attempted: Yes   Aspirate amount (mL):  0 Patient tolerance:  Patient tolerated the procedure well with no immediate complications  Right greater trochanter bursa injected with 0.25% bupivacaine  (one half mL's) mixed with 40 mg of Kenalog Large Joint Inj Date/Time: 12/13/2016 11:38 AM Performed by: Eliezer Lofts Authorized by: Eliezer Lofts   Consent Given by:  Patient Site marked: the procedure site was marked   Timeout: prior to procedure the correct patient, procedure, and site was verified   Indications:  Pain Location:  Hip Site:  L greater trochanter Prep: patient was prepped and draped in usual sterile fashion   Needle Size:  27 G Approach:  Superior Ultrasound Guidance: No   Fluoroscopic Guidance: No   Arthrogram: No   Medications:  1.5 mL lidocaine 1 %; 40 mg triamcinolone acetonide 40 MG/ML Aspiration Attempted: Yes   Aspirate amount (mL):  0 Patient tolerance:  Patient tolerated the procedure well with no immediate complications  Left greater trochanter bursa injected with 1% lidocaine without epinephrine and '40mg'$  of Kenalog.   Allergies: Divalproex sodium   Assessment / Plan:     Visit Diagnoses: Fibromyalgia  Primary insomnia  Other fatigue  Primary osteoarthritis of both hands  Primary osteoarthritis of both knees  Primary osteoarthritis of both feet  History of migraine  History of sleep apnea  Trochanteric bursitis, right hip - Plan: Large Joint Injection/Arthrocentesis  Greater trochanteric bursitis, left - Plan: Large Joint Injection/Arthrocentesis    Her medical history is complicated by depression, migraines and sleep apnea.    Plan: #1: Fibromyalgia #2: Fatigue and insomnia #3: OA of both knees #4: Apply for Hyalgan 5 both knees, Euflexxa or Orthovisc are acceptable if Hyalgan is not preferred by insurance company Patient finished last series of Hyalgan 04/28/2016 and did well until lately. Does not need any injections of cortisone at this visit but if there is a delay in getting Visco supplementation, she might need cortisone injection0  Orders: Orders Placed This Encounter  Procedures  . Large Joint  Injection/Arthrocentesis  . Large Joint Injection/Arthrocentesis   Meds ordered this encounter  Medications  . Vitamin D, Ergocalciferol, (DRISDOL) 50000 units CAPS capsule    Sig: Take 1 capsule (50,000 Units total) by mouth every 14 (fourteen) days.    Dispense:  12 capsule    Refill:  0    Order Specific Question:   Supervising Provider    Answer:   Bo Merino [phone]    Face-to-face time spent with patient was 30 minutes. 50% of time was spent in counseling and coordination of care.  Follow-Up Instructions: Return in about 5 months (around 05/15/2017).   Eliezer Lofts, PA-C  Note - This record has been created using [employer].  Chart creation errors have been sought, but may not always  have been located. Such creation  errors do not reflect on  the standard of medical care.

## 2016-12-07 ENCOUNTER — Encounter: Payer: Self-pay | Admitting: Podiatry

## 2016-12-07 ENCOUNTER — Ambulatory Visit (INDEPENDENT_AMBULATORY_CARE_PROVIDER_SITE_OTHER): Payer: Medicare Other

## 2016-12-07 ENCOUNTER — Ambulatory Visit (INDEPENDENT_AMBULATORY_CARE_PROVIDER_SITE_OTHER): Payer: Medicare Other | Admitting: Podiatry

## 2016-12-07 DIAGNOSIS — B079 Viral wart, unspecified: Secondary | ICD-10-CM | POA: Diagnosis not present

## 2016-12-07 DIAGNOSIS — M779 Enthesopathy, unspecified: Secondary | ICD-10-CM

## 2016-12-07 DIAGNOSIS — L84 Corns and callosities: Secondary | ICD-10-CM

## 2016-12-07 MED ORDER — TRIAMCINOLONE ACETONIDE 10 MG/ML IJ SUSP
10.0000 mg | Freq: Once | INTRAMUSCULAR | Status: AC
  Administered 2016-12-07: 10 mg

## 2016-12-07 NOTE — Progress Notes (Signed)
   Subjective:    Patient ID: Kari Flynn, female    DOB: 1951-08-03, 66 y.o.   MRN: [id number]  HPI Chief Complaint  Patient presents with  . Painful lesions    Right foot; great toe-lateral; 2nd toe-medial side; 3rd toe-bottom of toe (wart?)  . Ankle Pain    Right foot; lateral side; x6 months      Review of Systems  Musculoskeletal: Positive for arthralgias and myalgias.  Skin: Positive for color change.  Neurological: Positive for headaches.  Hematological: Bruises/bleeds easily.  All other systems reviewed and are negative.      Objective:   Physical Exam        Assessment & Plan:

## 2016-12-10 NOTE — Progress Notes (Signed)
Subjective:     Patient ID: Kari Flynn, female   DOB: 09-03-1950, 66 y.o.   MRN: [id number]  HPI patient presents with a painful corn between the hallux and second toe of the right foot with a different type of lesion on the distal end of the third toe right that upon debridement is very painful and states she feels like she's walking on a nodule   Review of Systems  All other systems reviewed and are negative.      Objective:   Physical Exam  Constitutional: She is oriented to person, place, and time.  Cardiovascular: Intact distal pulses.   Musculoskeletal: Normal range of motion.  Neurological: She is oriented to person, place, and time.  Skin: Skin is warm.  Nursing note and vitals reviewed.  neurovascular status found to be intact muscle strength was adequate with patient noted to have a deviated hallux against second toe with history of bunion surgery that I had done years ago and also at the distal end of the third toe right there is a Black lesion that upon debridement show pinpoint bleeding and pain to lateral pressure     Assessment:     Several conditions with one being mild structural changes to right big toe leading to keratotic lesion and pressure between the hallux toe second toe and probable verruca plantaris plantar aspect third digit right    Plan:     H&P both conditions discussed x-ray reviewed. Today I went ahead I debrided the lesions on the right foot and applied padding and for the third digit I used sterile instrumentation debrided lesion and apply chemical agent to create an immune response. Applied sterile dressing and explained what to do if this should blister and will be seen back in 4 weeks  X-ray report indicates that there is moderate change of the right hallux but the structural bunion has held up well

## 2016-12-13 ENCOUNTER — Encounter: Payer: Self-pay | Admitting: Rheumatology

## 2016-12-13 ENCOUNTER — Ambulatory Visit (INDEPENDENT_AMBULATORY_CARE_PROVIDER_SITE_OTHER): Payer: Medicare Other | Admitting: Rheumatology

## 2016-12-13 VITALS — BP 136/74 | HR 78 | Resp 16 | Wt 170.0 lb

## 2016-12-13 DIAGNOSIS — R5383 Other fatigue: Secondary | ICD-10-CM

## 2016-12-13 DIAGNOSIS — M797 Fibromyalgia: Secondary | ICD-10-CM | POA: Diagnosis not present

## 2016-12-13 DIAGNOSIS — M19041 Primary osteoarthritis, right hand: Secondary | ICD-10-CM | POA: Diagnosis not present

## 2016-12-13 DIAGNOSIS — M19072 Primary osteoarthritis, left ankle and foot: Secondary | ICD-10-CM

## 2016-12-13 DIAGNOSIS — F5101 Primary insomnia: Secondary | ICD-10-CM | POA: Diagnosis not present

## 2016-12-13 DIAGNOSIS — M7061 Trochanteric bursitis, right hip: Secondary | ICD-10-CM | POA: Diagnosis not present

## 2016-12-13 DIAGNOSIS — M19071 Primary osteoarthritis, right ankle and foot: Secondary | ICD-10-CM | POA: Diagnosis not present

## 2016-12-13 DIAGNOSIS — M7062 Trochanteric bursitis, left hip: Secondary | ICD-10-CM

## 2016-12-13 DIAGNOSIS — M170 Bilateral primary osteoarthritis of knee: Secondary | ICD-10-CM

## 2016-12-13 DIAGNOSIS — M19042 Primary osteoarthritis, left hand: Secondary | ICD-10-CM | POA: Diagnosis not present

## 2016-12-13 DIAGNOSIS — Z8669 Personal history of other diseases of the nervous system and sense organs: Secondary | ICD-10-CM

## 2016-12-13 MED ORDER — LIDOCAINE HCL 1 % IJ SOLN
1.5000 mL | INTRAMUSCULAR | Status: AC | PRN
  Administered 2016-12-13: 1.5 mL

## 2016-12-13 MED ORDER — BUPIVACAINE HCL 0.25 % IJ SOLN
2.0000 mL | INTRAMUSCULAR | Status: AC | PRN
  Administered 2016-12-13: 2 mL via INTRA_ARTICULAR

## 2016-12-13 MED ORDER — VITAMIN D (ERGOCALCIFEROL) 1.25 MG (50000 UNIT) PO CAPS: 50000.0000 [IU] | ORAL_CAPSULE | ORAL | 0 refills | Status: DC

## 2016-12-13 MED ORDER — TRIAMCINOLONE ACETONIDE 40 MG/ML IJ SUSP
40.0000 mg | INTRAMUSCULAR | Status: AC | PRN
  Administered 2016-12-13: 40 mg via INTRA_ARTICULAR

## 2016-12-19 ENCOUNTER — Other Ambulatory Visit: Payer: Self-pay | Admitting: Internal Medicine

## 2016-12-19 DIAGNOSIS — D3132 Benign neoplasm of left choroid: Secondary | ICD-10-CM | POA: Diagnosis not present

## 2016-12-19 DIAGNOSIS — H2513 Age-related nuclear cataract, bilateral: Secondary | ICD-10-CM | POA: Diagnosis not present

## 2016-12-19 DIAGNOSIS — H5213 Myopia, bilateral: Secondary | ICD-10-CM | POA: Diagnosis not present

## 2016-12-19 DIAGNOSIS — H43813 Vitreous degeneration, bilateral: Secondary | ICD-10-CM | POA: Diagnosis not present

## 2016-12-20 NOTE — Telephone Encounter (Signed)
Dr. Annamaria Boots, Ms. Cuyler is asking for a refill on her modafinil 200mg . Her last OV with you was on 08/15/16. Her last RX from you was on 05/08/16 (30 tabs for 5 refills).   Is it ok for her to receive another refill? Patient wishes to use [facility] in [place]. Thanks!

## 2016-12-21 NOTE — Telephone Encounter (Signed)
Rx sent for 6 months refill.

## 2016-12-21 NOTE — Telephone Encounter (Signed)
Ok to refill total 6 months 

## 2016-12-21 NOTE — Telephone Encounter (Signed)
This rx was on the printer, had CY sign and contacted the pt and verified which pharmacy this needed to go to. Faxed over the rx. As of now nothing further is needed

## 2017-01-04 ENCOUNTER — Ambulatory Visit (INDEPENDENT_AMBULATORY_CARE_PROVIDER_SITE_OTHER): Payer: Medicare Other | Admitting: Podiatry

## 2017-01-04 DIAGNOSIS — L905 Scar conditions and fibrosis of skin: Secondary | ICD-10-CM | POA: Diagnosis not present

## 2017-01-04 DIAGNOSIS — B079 Viral wart, unspecified: Secondary | ICD-10-CM

## 2017-01-04 NOTE — Patient Instructions (Signed)

## 2017-01-04 NOTE — Progress Notes (Signed)
Subjective:    Patient ID: Kari Flynn, female   DOB: 66 y.o.   MRN: [id number]   HPI patient states she wants to get this lesion taken off. Points the third digit right foot    ROS      Objective:  Physical Exam neurovascular status intact muscle strength adequate with lesion on the distal portion third digit right measuring approximate 5 mm x 6 mm that's painful lateral pressure and does have what appears to be significant depth     Assessment:    Verruca plantaris right third digit     Plan:     Did not respond well to medication and I've recommended excision. I did proximal nerve block of the right third toe and I then went ahead and circumscribed the lesion and removed it in toto and it appeared to be based on pathology from a gross standpoint to be wart. I then cleaned the plantar surface applied a small amount of phenol and sterile dressing instructed on soaks and reappoint

## 2017-01-05 NOTE — Addendum Note (Signed)
Addended by: Johnnye Lana A on: 01/05/2017 02:41 PM   Modules accepted: Orders

## 2017-01-15 ENCOUNTER — Telehealth: Payer: Self-pay | Admitting: Rheumatology

## 2017-01-15 NOTE — Telephone Encounter (Signed)
Left message for patient to call back to schedule knee injection appts.

## 2017-01-15 NOTE — Telephone Encounter (Signed)
-----   Message from [facility] May, RT sent at 01/12/2017  4:07 PM EDT ----- Regarding: schedule Hyalgan Ok to schedule this one for Hyalgan x5 bilateral knees, buy and bill.    Primary [employer] will cover 80% and secondary [employer] will cover the remaining 20%. Thanks.  ----- Message ----- From: Shona Needles, RT Sent: 12/27/2016   4:32 PM To: Wendy May, RT Subject: FW: Apply for Visco supplementation both kne#    ----- Message ----- From: Eliezer Lofts, PA-C Sent: 12/13/2016  10:53 AM To: Shona Needles, RT Subject: Apply for Visco supplementation both knees. #   Apply for Visco supplementation both knees. Hyalgan is preferred. Euflexxa or Orthovisc is acceptable if insurance prefers dose over Hyalgan  Last injection of hyalgan x 5 (both knees) completedd 04/28/2016.

## 2017-01-18 ENCOUNTER — Encounter: Payer: Self-pay | Admitting: Podiatry

## 2017-01-18 ENCOUNTER — Ambulatory Visit (INDEPENDENT_AMBULATORY_CARE_PROVIDER_SITE_OTHER): Payer: Self-pay | Admitting: Podiatry

## 2017-01-18 DIAGNOSIS — B079 Viral wart, unspecified: Secondary | ICD-10-CM

## 2017-01-18 NOTE — Progress Notes (Signed)
Subjective:    Patient ID: Kari Flynn, female   DOB: 66 y.o.   MRN: [id number]   HPI patient states I wanted this toe looked at    ROS      Objective:  Physical Exam Neurovascular status intact with crusted tissue on the right third toe but it is normal    Assessment:   Up appears to be a normal area with a small blister post removal and excision of mass      Plan:     Clean tissue applied sterile dressing and reappoint as needed

## 2017-01-23 DIAGNOSIS — M47812 Spondylosis without myelopathy or radiculopathy, cervical region: Secondary | ICD-10-CM | POA: Diagnosis not present

## 2017-01-23 DIAGNOSIS — Z79891 Long term (current) use of opiate analgesic: Secondary | ICD-10-CM | POA: Diagnosis not present

## 2017-01-23 DIAGNOSIS — G43109 Migraine with aura, not intractable, without status migrainosus: Secondary | ICD-10-CM | POA: Diagnosis not present

## 2017-01-23 DIAGNOSIS — G894 Chronic pain syndrome: Secondary | ICD-10-CM | POA: Diagnosis not present

## 2017-03-27 DIAGNOSIS — Z79891 Long term (current) use of opiate analgesic: Secondary | ICD-10-CM | POA: Diagnosis not present

## 2017-03-27 DIAGNOSIS — G894 Chronic pain syndrome: Secondary | ICD-10-CM | POA: Diagnosis not present

## 2017-03-27 DIAGNOSIS — M47812 Spondylosis without myelopathy or radiculopathy, cervical region: Secondary | ICD-10-CM | POA: Diagnosis not present

## 2017-03-27 DIAGNOSIS — G43109 Migraine with aura, not intractable, without status migrainosus: Secondary | ICD-10-CM | POA: Diagnosis not present

## 2017-04-02 ENCOUNTER — Encounter: Payer: Self-pay | Admitting: Rheumatology

## 2017-04-02 ENCOUNTER — Ambulatory Visit (INDEPENDENT_AMBULATORY_CARE_PROVIDER_SITE_OTHER): Payer: Medicare Other | Admitting: Rheumatology

## 2017-04-02 ENCOUNTER — Ambulatory Visit: Payer: Medicare Other | Admitting: Rheumatology

## 2017-04-02 DIAGNOSIS — M170 Bilateral primary osteoarthritis of knee: Secondary | ICD-10-CM | POA: Diagnosis not present

## 2017-04-02 MED ORDER — LIDOCAINE HCL 1 % IJ SOLN
1.5000 mL | INTRAMUSCULAR | Status: AC | PRN
  Administered 2017-04-02: 1.5 mL

## 2017-04-02 MED ORDER — SODIUM HYALURONATE (VISCOSUP) 20 MG/2ML IX SOSY
20.0000 mg | PREFILLED_SYRINGE | INTRA_ARTICULAR | Status: AC | PRN
  Administered 2017-04-02: 20 mg via INTRA_ARTICULAR

## 2017-04-02 MED ORDER — TRIAMCINOLONE ACETONIDE 40 MG/ML IJ SUSP: 40.0000 mg | INTRAMUSCULAR | Status: DC | PRN

## 2017-04-02 NOTE — Progress Notes (Addendum)
   Procedure Note  Patient: Kari Flynn             Date of Birth: 02-Dec-1950           MRN: [id number]             Visit Date: 04/02/2017  Procedures: Visit Diagnoses: No diagnosis found.  Large Joint Inj Date/Time: 04/02/2017 9:27 AM Performed by: Bo Merino Authorized by: Bo Merino   Consent Given by:  Patient Site marked: the procedure site was marked   Timeout: prior to procedure the correct patient, procedure, and site was verified   Indications:  Pain and joint swelling Location:  Knee Site:  R knee Prep: patient was prepped and draped in usual sterile fashion   Needle Size:  27 G Needle Length:  1.5 inches Approach:  Medial Ultrasound Guidance: No   Fluoroscopic Guidance: No   Arthrogram: No   Medications:  20 mg Sodium Hyaluronate 20 MG/2ML; 1.5 mL lidocaine 1 % Aspiration Attempted: Yes   Aspirate amount (mL):  0 Patient tolerance:  Patient tolerated the procedure well with no immediate complications Large Joint Inj Date/Time: 04/02/2017 9:29 AM Performed by: Bo Merino Authorized by: Bo Merino   Consent Given by:  Patient Site marked: the procedure site was marked   Timeout: prior to procedure the correct patient, procedure, and site was verified   Indications:  Pain and joint swelling Location:  Knee Site:  L knee Prep: patient was prepped and draped in usual sterile fashion   Needle Size:  27 G Needle Length:  1.5 inches Approach:  Medial Ultrasound Guidance: No   Fluoroscopic Guidance: No   Arthrogram: No   Medications:  1.5 mL lidocaine 1 %; 20 mg Sodium Hyaluronate 20 MG/2ML Aspiration Attempted: Yes   Aspirate amount (mL):  0 Patient tolerance:  Patient tolerated the procedure well with no immediate complications  Bo Merino, MD

## 2017-04-02 NOTE — Addendum Note (Signed)
Addended by: Bo Merino on: 04/02/2017 09:33 AM   Modules accepted: Level of Service

## 2017-04-03 NOTE — Progress Notes (Signed)
   Procedure Note  Patient: Kari Flynn             Date of Birth: 1951-08-09           MRN: [id number]             Visit Date: 04/09/2017  Procedures: Visit Diagnoses: Primary osteoarthritis of both knees Hyalgan #2 Bilateral knees   Large Joint Inj Date/Time: 04/09/2017 10:57 AM Performed by: Bo Merino Authorized by: Bo Merino   Consent Given by:  Patient Site marked: the procedure site was marked   Timeout: prior to procedure the correct patient, procedure, and site was verified   Indications:  Pain and joint swelling Location:  Knee Site:  R knee Prep: patient was prepped and draped in usual sterile fashion   Needle Size:  27 G Needle Length:  1.5 inches Approach:  Medial Ultrasound Guidance: No   Fluoroscopic Guidance: No   Arthrogram: No   Medications:  1.5 mL lidocaine 1 %; 20 mg Sodium Hyaluronate 20 MG/2ML Aspiration Attempted: Yes   Aspirate amount (mL):  0 Patient tolerance:  Patient tolerated the procedure well with no immediate complications Large Joint Inj Date/Time: 04/09/2017 10:57 AM Performed by: Bo Merino Authorized by: Bo Merino   Consent Given by:  Patient Site marked: the procedure site was marked   Timeout: prior to procedure the correct patient, procedure, and site was verified   Indications:  Pain and joint swelling Location:  Knee Site:  L knee Prep: patient was prepped and draped in usual sterile fashion   Needle Size:  27 G Needle Length:  1.5 inches Approach:  Medial Ultrasound Guidance: No   Fluoroscopic Guidance: No   Arthrogram: No   Medications:  1.5 mL lidocaine 1 %; 20 mg Sodium Hyaluronate 20 MG/2ML Aspiration Attempted: Yes   Aspirate amount (mL):  0 Patient tolerance:  Patient tolerated the procedure well with no immediate complications   Bo Merino, MD

## 2017-04-06 NOTE — Progress Notes (Signed)
   Procedure Note  Patient: Kari Flynn             Date of Birth: May 09, 1951           MRN: [id number]             Visit Date: 04/16/2017  Procedures: Visit Diagnoses: Primary osteoarthritis of both knees Hyalgan #3 Bilateral  Large Joint Inj Date/Time: 04/16/2017 9:31 AM Performed by: Bo Merino Authorized by: Bo Merino   Consent Given by:  Patient Site marked: the procedure site was marked   Timeout: prior to procedure the correct patient, procedure, and site was verified   Indications:  Pain and joint swelling Location:  Knee Site:  R knee Prep: patient was prepped and draped in usual sterile fashion   Needle Size:  27 G Needle Length:  1.5 inches Approach:  Medial Ultrasound Guidance: No   Fluoroscopic Guidance: No   Arthrogram: No   Medications:  1.5 mL lidocaine 1 %; 20 mg Sodium Hyaluronate 20 MG/2ML Aspiration Attempted: Yes   Aspirate amount (mL):  0 Patient tolerance:  Patient tolerated the procedure well with no immediate complications Large Joint Inj Date/Time: 04/16/2017 9:31 AM Performed by: Bo Merino Authorized by: Bo Merino   Consent Given by:  Patient Site marked: the procedure site was marked   Timeout: prior to procedure the correct patient, procedure, and site was verified   Indications:  Pain and joint swelling Location:  Knee Site:  L knee Prep: patient was prepped and draped in usual sterile fashion   Needle Size:  27 G Needle Length:  1.5 inches Approach:  Medial Ultrasound Guidance: No   Fluoroscopic Guidance: No   Arthrogram: No   Medications:  1.5 mL lidocaine 1 %; 20 mg Sodium Hyaluronate 20 MG/2ML Aspiration Attempted: Yes   Aspirate amount (mL):  0 Patient tolerance:  Patient tolerated the procedure well with no immediate complications   Bo Merino, MD

## 2017-04-09 ENCOUNTER — Ambulatory Visit (INDEPENDENT_AMBULATORY_CARE_PROVIDER_SITE_OTHER): Payer: Medicare Other | Admitting: Rheumatology

## 2017-04-09 ENCOUNTER — Ambulatory Visit: Payer: Medicare Other | Admitting: Rheumatology

## 2017-04-09 DIAGNOSIS — M170 Bilateral primary osteoarthritis of knee: Secondary | ICD-10-CM

## 2017-04-09 MED ORDER — SODIUM HYALURONATE (VISCOSUP) 20 MG/2ML IX SOSY
20.0000 mg | PREFILLED_SYRINGE | INTRA_ARTICULAR | Status: AC | PRN
  Administered 2017-04-09: 20 mg via INTRA_ARTICULAR

## 2017-04-09 MED ORDER — LIDOCAINE HCL 1 % IJ SOLN
1.5000 mL | INTRAMUSCULAR | Status: AC | PRN
  Administered 2017-04-09: 1.5 mL

## 2017-04-16 ENCOUNTER — Ambulatory Visit (INDEPENDENT_AMBULATORY_CARE_PROVIDER_SITE_OTHER): Payer: Medicare Other | Admitting: Rheumatology

## 2017-04-16 ENCOUNTER — Ambulatory Visit: Payer: Medicare Other | Admitting: Rheumatology

## 2017-04-16 DIAGNOSIS — M170 Bilateral primary osteoarthritis of knee: Secondary | ICD-10-CM

## 2017-04-16 MED ORDER — LIDOCAINE HCL 1 % IJ SOLN
1.5000 mL | INTRAMUSCULAR | Status: AC | PRN
  Administered 2017-04-16: 1.5 mL

## 2017-04-16 MED ORDER — SODIUM HYALURONATE (VISCOSUP) 20 MG/2ML IX SOSY
20.0000 mg | PREFILLED_SYRINGE | INTRA_ARTICULAR | Status: AC | PRN
  Administered 2017-04-16: 20 mg via INTRA_ARTICULAR

## 2017-04-17 NOTE — Progress Notes (Signed)
   Procedure Note  Patient: Kari Flynn             Date of Birth: [birth year]           MRN: [id number]             Visit Date: 04/23/2017  Procedures: Visit Diagnoses: Primary osteoarthritis of both knees Hyalgan #4 Bilateral  Large Joint Inj Date/Time: 04/23/2017 8:41 AM Performed by: Bo Merino Authorized by: Bo Merino   Consent Given by:  Patient Site marked: the procedure site was marked   Timeout: prior to procedure the correct patient, procedure, and site was verified   Indications:  Pain Location:  Knee Site:  R knee Prep: patient was prepped and draped in usual sterile fashion   Needle Size:  27 G Needle Length:  1.5 inches Ultrasound Guidance: No   Fluoroscopic Guidance: No   Arthrogram: No   Medications:  1.5 mL lidocaine 1 %; 20 mg Sodium Hyaluronate 20 MG/2ML Aspiration Attempted: Yes   Patient tolerance:  Patient tolerated the procedure well with no immediate complications Large Joint Inj Date/Time: 04/23/2017 8:42 AM Performed by: Bo Merino Authorized by: Bo Merino   Consent Given by:  Patient Site marked: the procedure site was marked   Timeout: prior to procedure the correct patient, procedure, and site was verified   Indications:  Pain Location:  Knee Site:  L knee Prep: patient was prepped and draped in usual sterile fashion   Needle Size:  27 G Needle Length:  1.5 inches Ultrasound Guidance: No   Fluoroscopic Guidance: No   Arthrogram: No   Medications:  20 mg Sodium Hyaluronate 20 MG/2ML; 1.5 mL lidocaine (PF) 1 % Aspiration Attempted: Yes   Patient tolerance:  Patient tolerated the procedure well with no immediate complications   Bo Merino, MD

## 2017-04-23 ENCOUNTER — Ambulatory Visit (INDEPENDENT_AMBULATORY_CARE_PROVIDER_SITE_OTHER): Payer: Medicare Other | Admitting: Rheumatology

## 2017-04-23 ENCOUNTER — Ambulatory Visit: Payer: Medicare Other | Admitting: Rheumatology

## 2017-04-23 DIAGNOSIS — M170 Bilateral primary osteoarthritis of knee: Secondary | ICD-10-CM

## 2017-04-23 MED ORDER — LIDOCAINE HCL (PF) 1 % IJ SOLN
1.5000 mL | INTRAMUSCULAR | Status: AC | PRN
  Administered 2017-04-23: 1.5 mL

## 2017-04-23 MED ORDER — SODIUM HYALURONATE (VISCOSUP) 20 MG/2ML IX SOSY
20.0000 mg | PREFILLED_SYRINGE | INTRA_ARTICULAR | Status: AC | PRN
  Administered 2017-04-23: 20 mg via INTRA_ARTICULAR

## 2017-04-23 MED ORDER — LIDOCAINE HCL 1 % IJ SOLN
1.5000 mL | INTRAMUSCULAR | Status: AC | PRN
  Administered 2017-04-23: 1.5 mL

## 2017-04-26 NOTE — Progress Notes (Signed)
   Procedure Note  Patient: Kari Flynn             Date of Birth: 1951-01-09           MRN: [id number]             Visit Date: 05/02/2017  Procedures: Visit Diagnoses: Primary osteoarthritis of both knees Hyalgan #5 Bilateral knees  Large Joint Inj Date/Time: 05/02/2017 9:25 AM Performed by: Bo Merino Authorized by: Bo Merino   Consent Given by:  Patient Site marked: the procedure site was marked   Timeout: prior to procedure the correct patient, procedure, and site was verified   Indications:  Pain and joint swelling Location:  Knee Site:  R knee Prep: patient was prepped and draped in usual sterile fashion   Needle Size:  27 G Needle Length:  1.5 inches Approach:  Medial Ultrasound Guidance: No   Fluoroscopic Guidance: No   Arthrogram: No   Medications:  20 mg Sodium Hyaluronate 20 MG/2ML; 1.5 mL lidocaine 1 % Aspiration Attempted: Yes   Patient tolerance:  Patient tolerated the procedure well with no immediate complications  Large Joint Inj Date/Time: 05/02/2017 9:29 AM Performed by: Bo Merino Authorized by: Bo Merino   Consent Given by:  Patient Site marked: the procedure site was marked   Timeout: prior to procedure the correct patient, procedure, and site was verified   Indications:  Pain Location:  Knee Prep: patient was prepped and draped in usual sterile fashion   Needle Size:  27 G Needle Length:  1.5 inches Ultrasound Guidance: No   Fluoroscopic Guidance: No   Arthrogram: No   Medications:  1.5 mL lidocaine 1 %; 20 mg Sodium Hyaluronate 20 MG/2ML Aspiration Attempted: Yes   Patient tolerance:  Patient tolerated the procedure well with no immediate complications    Bo Merino, MD

## 2017-05-02 ENCOUNTER — Ambulatory Visit (INDEPENDENT_AMBULATORY_CARE_PROVIDER_SITE_OTHER): Payer: Medicare Other | Admitting: Rheumatology

## 2017-05-02 ENCOUNTER — Ambulatory Visit: Payer: Medicare Other | Admitting: Rheumatology

## 2017-05-02 DIAGNOSIS — M170 Bilateral primary osteoarthritis of knee: Secondary | ICD-10-CM

## 2017-05-02 MED ORDER — LIDOCAINE HCL 1 % IJ SOLN
1.5000 mL | INTRAMUSCULAR | Status: AC | PRN
  Administered 2017-05-02: 1.5 mL

## 2017-05-02 MED ORDER — SODIUM HYALURONATE (VISCOSUP) 20 MG/2ML IX SOSY
20.0000 mg | PREFILLED_SYRINGE | INTRA_ARTICULAR | Status: AC | PRN
  Administered 2017-05-02: 20 mg via INTRA_ARTICULAR

## 2017-05-11 NOTE — Progress Notes (Signed)
Office Visit Note  Patient: Kari Flynn             Date of Birth: [birth year]           MRN: [id number]             PCP: Elby Showers, MD Referring: Elby Showers, MD Visit Date: 05/23/2017 Occupation: '@GUAROCC' @    Subjective: Neck and hip pain   History of Present Illness: Kari Flynn is a 66 y.o. female with history of osteoarthritis and fibromyalgia. She states she did quite well after Visco supplement injections to her bilateral knee joints. She's been walking on regular basis now. She states she's been experiencing bilateral trochanteric area pain after walking. She's also having some stiffness in her neck. She has some trapezius pain bilaterally.  Activities of Daily Living:  Patient reports morning stiffness for 2 hours.   Patient Reports nocturnal pain.  Difficulty dressing/grooming: Denies Difficulty climbing stairs: Reports Difficulty getting out of chair: Reports Difficulty using hands for taps, buttons, cutlery, and/or writing: Denies   Review of Systems  Constitutional: Positive for fatigue. Negative for night sweats, weight gain, weight loss and weakness.  HENT: Negative.  Negative for mouth sores, trouble swallowing, trouble swallowing, mouth dryness and nose dryness.   Eyes: Negative.  Negative for pain, redness, visual disturbance and dryness.  Respiratory: Negative.  Negative for cough, shortness of breath and difficulty breathing.   Cardiovascular: Negative.  Negative for chest pain, palpitations, hypertension, irregular heartbeat and swelling in legs/feet.  Gastrointestinal: Negative.  Negative for blood in stool, constipation and diarrhea.  Endocrine: Negative.  Negative for increased urination.  Genitourinary: Negative.  Negative for vaginal dryness.  Musculoskeletal: Positive for arthralgias, joint pain, myalgias, morning stiffness, muscle tenderness and myalgias. Negative for joint swelling and muscle weakness.  Skin: Negative.  Negative for color  change, rash, hair loss, skin tightness, ulcers and sensitivity to sunlight.  Allergic/Immunologic: Negative.  Negative for susceptible to infections.  Neurological: Negative.  Negative for dizziness, memory loss and night sweats.  Hematological: Negative for bruising/bleeding tendency and swollen glands.       Easy bruising worse with Aleve  Psychiatric/Behavioral: Negative.  Negative for depressed mood and sleep disturbance. The patient is not nervous/anxious.     PMFS History:  Patient Active Problem List   Diagnosis Date Noted  . Hypersomnia with sleep apnea 08/14/2014  . Osteoarthritis of both knees 03/22/2014  . Unspecified constipation 10/16/2013  . Insomnia 06/23/2010  . Obstructive sleep apnea 06/25/2008  . MIGRAINE HEADACHE 06/25/2008  . Fibromyalgia 06/25/2008  . Shortness of breath 06/25/2008    Past Medical History:  Diagnosis Date  . Arthritis   . Dyspnea   . Lump of breast, left 11/08/2016   scheduled for a diagnostic/US mammo bilateral and stereo, US aspiration and ductogram.   . Migraine, unspecified, without mention of intractable migraine without mention of status migrainosus   . Myalgia and myositis, unspecified   . Obstructive sleep apnea (adult) (pediatric)     Family History  Problem Relation Age of Onset  . Hypertension Mother   . Arthritis Mother   . Hyperlipidemia Mother   . Melanoma Mother   . Diverticulitis Mother   . Multiple myeloma Father   . Arthritis Unknown        sibling  . Sarcoidosis Unknown        sibling-also has NIDDM  . Diabetes Brother   . Sarcoidosis Brother   . Diabetes Paternal Grandfather   .  Colon cancer Neg Hx   . Esophageal cancer Neg Hx   . Rectal cancer Neg Hx   . Stomach cancer Neg Hx    Past Surgical History:  Procedure Laterality Date  . BLEPHAROPLASTY    . BREAST BIOPSY     B9  . BUNIONECTOMY     x2  . WISDOM TOOTH EXTRACTION     Social History   Social History Narrative  . No narrative on file      Objective: Vital Signs: BP 124/62   Pulse 62   Resp 16   Ht '5\' 6"'  (1.676 m)   Wt 176 lb (79.8 kg)   BMI 28.41 kg/m    Physical Exam  Constitutional: She is oriented to person, place, and time. She appears well-developed and well-nourished.  HENT:  Head: Normocephalic and atraumatic.  Eyes: Conjunctivae and EOM are normal.  Neck: Normal range of motion.  Cardiovascular: Normal rate, regular rhythm, normal heart sounds and intact distal pulses.   Pulmonary/Chest: Effort normal and breath sounds normal.  Abdominal: Soft. Bowel sounds are normal.  Lymphadenopathy:    She has no cervical adenopathy.  Neurological: She is alert and oriented to person, place, and time.  Skin: Skin is warm and dry. Capillary refill takes less than 2 seconds.  Psychiatric: She has a normal mood and affect. Her behavior is normal.  Nursing note and vitals reviewed.    Musculoskeletal Exam: C-spine and thoracic lumbar spine good range of motion. Shoulder joints elbow joints wrist joint MCPs PIPs DIPs with good range of motion with no synovitis. Hip joints knee joints ankles MTPs PIPs DIPs with good range of motion with no synovitis. She had tenderness on palpation over bilateral trochanteric area in bilateral trapezius area. Fibromyalgia tender points with 12 out of 18 positive.  CDAI Exam: No CDAI exam completed.    Investigation: No additional findings.   Imaging: No results found.  Speciality Comments: No specialty comments available.    Procedures:  Large Joint Inj Date/Time: 05/23/2017 11:45 AM Performed by: Bo Merino Authorized by: Bo Merino   Consent Given by:  Patient Site marked: the procedure site was marked   Timeout: prior to procedure the correct patient, procedure, and site was verified   Indications:  Pain Location:  Hip Site:  R greater trochanter Prep: patient was prepped and draped in usual sterile fashion   Needle Size:  27 G Needle Length:  1.5  inches Approach:  Lateral Ultrasound Guidance: No   Fluoroscopic Guidance: No   Arthrogram: No   Medications:  40 mg triamcinolone acetonide 40 MG/ML; 1.5 mL lidocaine 1 % Aspiration Attempted: No   Aspirate amount (mL):  0 Patient tolerance:  Patient tolerated the procedure well with no immediate complications Large Joint Inj Date/Time: 05/23/2017 11:45 AM Performed by: Bo Merino Authorized by: Bo Merino   Consent Given by:  Patient Site marked: the procedure site was marked   Timeout: prior to procedure the correct patient, procedure, and site was verified   Indications:  Pain Location:  Hip Site:  L greater trochanter Prep: patient was prepped and draped in usual sterile fashion   Needle Size:  27 G Needle Length:  1.5 inches Approach:  Lateral Ultrasound Guidance: No   Fluoroscopic Guidance: No   Arthrogram: No   Medications:  40 mg triamcinolone acetonide 40 MG/ML; 1.5 mL lidocaine 1 % Aspiration Attempted: No   Aspirate amount (mL):  0 Patient tolerance:  Patient tolerated the procedure well with no  immediate complications   Allergies: Divalproex sodium   Assessment / Plan:     Visit Diagnoses: Trochanteric bursitis of both hips: She's having a lot of discomfort in the bilateral trochanteric area. Different treatment options and their side effects were discussed. Per patient's request bilateral trochanteric area was injected with cortisone as described above. A handout on IT band exercises was also given.  Neck pain: She has bilateral trapezius is spasm. Have given her a handout on neck exercises.  Primary osteoarthritis of both knees - BIL Hyalgan 03/2017-04/2017. Her knee joints are doing well after the viscose supplement injections. I will give her a prescription for Voltaren gel refill as requested. Side effects were reviewed.  Primary osteoarthritis of both hands: Joint protection and muscle strengthening discussed.   Vitamin D deficiency - Plan:  VITAMIN D 25 Hydroxy (Vit-D Deficiency, Fractures) she has finished a course of vitamin D we will check her vitamin D level today.  Fibromyalgia: She continues to have some generalized pain and discomfort.  History of fatigue  History of insomnia: Good sleep hygiene was discussed.  History of migraine  History of depression  History of sleep apnea    Orders: Orders Placed This Encounter  Procedures  . Large Joint Injection/Arthrocentesis  . Large Joint Injection/Arthrocentesis  . VITAMIN D 25 Hydroxy (Vit-D Deficiency, Fractures)   Meds ordered this encounter  Medications  . diclofenac sodium (VOLTAREN) 1 % GEL    Sig: Apply 4 g topically 4 (four) times daily.    Dispense:  3 Tube    Refill:  3    Face-to-face time spent with patient was 30 minutes. Greater than 50% of time was spent in counseling and coordination of care.  Follow-Up Instructions: Return in about 6 months (around 11/20/2017) for OA FMS.   Bo Merino, MD  Note - This record has been created using Editor, commissioning.  Chart creation errors have been sought, but may not always  have been located. Such creation errors do not reflect on  the standard of medical care.

## 2017-05-15 ENCOUNTER — Ambulatory Visit: Payer: Medicare Other | Admitting: Rheumatology

## 2017-05-22 DIAGNOSIS — Z79891 Long term (current) use of opiate analgesic: Secondary | ICD-10-CM | POA: Diagnosis not present

## 2017-05-22 DIAGNOSIS — M47812 Spondylosis without myelopathy or radiculopathy, cervical region: Secondary | ICD-10-CM | POA: Diagnosis not present

## 2017-05-22 DIAGNOSIS — G43109 Migraine with aura, not intractable, without status migrainosus: Secondary | ICD-10-CM | POA: Diagnosis not present

## 2017-05-22 DIAGNOSIS — G894 Chronic pain syndrome: Secondary | ICD-10-CM | POA: Diagnosis not present

## 2017-05-23 ENCOUNTER — Encounter: Payer: Self-pay | Admitting: Rheumatology

## 2017-05-23 ENCOUNTER — Ambulatory Visit (INDEPENDENT_AMBULATORY_CARE_PROVIDER_SITE_OTHER): Payer: Medicare Other | Admitting: Rheumatology

## 2017-05-23 VITALS — BP 124/62 | HR 62 | Resp 16 | Ht 66.0 in | Wt 176.0 lb

## 2017-05-23 DIAGNOSIS — M19041 Primary osteoarthritis, right hand: Secondary | ICD-10-CM | POA: Diagnosis not present

## 2017-05-23 DIAGNOSIS — M542 Cervicalgia: Secondary | ICD-10-CM | POA: Diagnosis not present

## 2017-05-23 DIAGNOSIS — Z87898 Personal history of other specified conditions: Secondary | ICD-10-CM

## 2017-05-23 DIAGNOSIS — M797 Fibromyalgia: Secondary | ICD-10-CM | POA: Diagnosis not present

## 2017-05-23 DIAGNOSIS — M7062 Trochanteric bursitis, left hip: Secondary | ICD-10-CM

## 2017-05-23 DIAGNOSIS — E559 Vitamin D deficiency, unspecified: Secondary | ICD-10-CM | POA: Diagnosis not present

## 2017-05-23 DIAGNOSIS — M7061 Trochanteric bursitis, right hip: Secondary | ICD-10-CM

## 2017-05-23 DIAGNOSIS — M19042 Primary osteoarthritis, left hand: Secondary | ICD-10-CM

## 2017-05-23 DIAGNOSIS — Z8669 Personal history of other diseases of the nervous system and sense organs: Secondary | ICD-10-CM

## 2017-05-23 DIAGNOSIS — M170 Bilateral primary osteoarthritis of knee: Secondary | ICD-10-CM | POA: Diagnosis not present

## 2017-05-23 DIAGNOSIS — Z8659 Personal history of other mental and behavioral disorders: Secondary | ICD-10-CM

## 2017-05-23 MED ORDER — TRIAMCINOLONE ACETONIDE 40 MG/ML IJ SUSP
40.0000 mg | INTRAMUSCULAR | Status: AC | PRN
  Administered 2017-05-23: 40 mg via INTRA_ARTICULAR

## 2017-05-23 MED ORDER — LIDOCAINE HCL 1 % IJ SOLN
1.5000 mL | INTRAMUSCULAR | Status: AC | PRN
  Administered 2017-05-23: 1.5 mL

## 2017-05-23 MED ORDER — DICLOFENAC SODIUM 1 % TD GEL: 4.0000 g | Freq: Four times a day (QID) | TRANSDERMAL | 3 refills | Status: DC

## 2017-05-23 NOTE — Patient Instructions (Signed)
Cervical Strain and Sprain Rehab Ask your health care provider which exercises are safe for you. Do exercises exactly as told by your health care provider and adjust them as directed. It is normal to feel mild stretching, pulling, tightness, or discomfort as you do these exercises, but you should stop right away if you feel sudden pain or your pain gets worse.Do not begin these exercises until told by your health care provider. Stretching and range of motion exercises These exercises warm up your muscles and joints and improve the movement and flexibility of your neck. These exercises also help to relieve pain, numbness, and tingling. Exercise A: Cervical side bend  1. Using good posture, sit on a stable chair or stand up. 2. Without moving your shoulders, slowly tilt your left / right ear to your shoulder until you feel a stretch in your neck muscles. You should be looking straight ahead. 3. Hold for __________ seconds. 4. Repeat with the other side of your neck. Repeat __________ times. Complete this exercise __________ times a day. Exercise B: Cervical rotation  1. Using good posture, sit on a stable chair or stand up. 2. Slowly turn your head to the side as if you are looking over your left / right shoulder. ? Keep your eyes level with the ground. ? Stop when you feel a stretch along the side and the back of your neck. 3. Hold for __________ seconds. 4. Repeat this by turning to your other side. Repeat __________ times. Complete this exercise __________ times a day. Exercise C: Thoracic extension and pectoral stretch 1. Roll a towel or a small blanket so it is about 4 inches (10 cm) in diameter. 2. Lie down on your back on a firm surface. 3. Put the towel lengthwise, under your spine in the middle of your back. It should not be not under your shoulder blades. The towel should line up with your spine from your middle back to your lower back. 4. Put your hands behind your head and let your  elbows fall out to your sides. 5. Hold for __________ seconds. Repeat __________ times. Complete this exercise __________ times a day. Strengthening exercises These exercises build strength and endurance in your neck. Endurance is the ability to use your muscles for a long time, even after your muscles get tired. Exercise D: Upper cervical flexion, isometric 1. Lie on your back with a thin pillow behind your head and a small rolled-up towel under your neck. 2. Gently tuck your chin toward your chest and nod your head down to look toward your feet. Do not lift your head off the pillow. 3. Hold for __________ seconds. 4. Release the tension slowly. Relax your neck muscles completely before you repeat this exercise. Repeat __________ times. Complete this exercise __________ times a day. Exercise E: Cervical extension, isometric  1. Stand about 6 inches (15 cm) away from a wall, with your back facing the wall. 2. Place a soft object, about 6-8 inches (15-20 cm) in diameter, between the back of your head and the wall. A soft object could be a small pillow, a ball, or a folded towel. 3. Gently tilt your head back and press into the soft object. Keep your jaw and forehead relaxed. 4. Hold for __________ seconds. 5. Release the tension slowly. Relax your neck muscles completely before you repeat this exercise. Repeat __________ times. Complete this exercise __________ times a day. Posture and body mechanics  Body mechanics refers to the movements and positions of   your body while you do your daily activities. Posture is part of body mechanics. Good posture and healthy body mechanics can help to relieve stress in your body's tissues and joints. Good posture means that your spine is in its natural S-curve position (your spine is neutral), your shoulders are pulled back slightly, and your head is not tipped forward. The following are general guidelines for applying improved posture and body mechanics to  your everyday activities. Standing  When standing, keep your spine neutral and keep your feet about hip-width apart. Keep a slight bend in your knees. Your ears, shoulders, and hips should line up.  When you do a task in which you stand in one place for a long time, place one foot up on a stable object that is 2-4 inches (5-10 cm) high, such as a footstool. This helps keep your spine neutral. Sitting   When sitting, keep your spine neutral and your keep feet flat on the floor. Use a footrest, if necessary, and keep your thighs parallel to the floor. Avoid rounding your shoulders, and avoid tilting your head forward.  When working at a desk or a computer, keep your desk at a height where your hands are slightly lower than your elbows. Slide your chair under your desk so you are close enough to maintain good posture.  When working at a computer, place your monitor at a height where you are looking straight ahead and you do not have to tilt your head forward or downward to look at the screen. Resting When lying down and resting, avoid positions that are most painful for you. Try to support your neck in a neutral position. You can use a contour pillow or a small rolled-up towel. Your pillow should support your neck but not push on it. This information is not intended to replace advice given to you by your health care provider. Make sure you discuss any questions you have with your health care provider. Document Released: 08/14/2005 Document Revised: 04/20/2016 Document Reviewed: 07/21/2015 Elsevier Interactive Patient Education  2018 [place] [facility] Ask your health care provider which exercises are safe for you. Do exercises exactly as told by your health care provider and adjust them as directed. It is normal to feel mild stretching, pulling, tightness, or discomfort as you do these exercises, but you should stop right away if you feel sudden pain or your pain gets worse.Do  not begin these exercises until told by your health care provider. Stretching and range of motion exercises These exercises warm up your muscles and joints and improve the movement and flexibility of your leg. These exercises also help to relieve pain and stiffness. Exercise A: Quadriceps stretch, prone  1. Lie on your abdomen on a firm surface, such as a bed or padded floor. 2. Bend your left / right knee and hold your ankle. If you cannot reach your ankle or pant leg, loop a belt around your foot and grab the belt instead. 3. Gently pull your heel toward your buttocks. Your knee should not slide out to the side. You should feel a stretch in the front of your thigh and knee. 4. Hold this position for __________ seconds. Repeat __________ times. Complete this exercise __________ times a day. Exercise B: Lunge ( adductor stretch) 1. Stand and spread your legs about 3 feet (about 1 m) apart. Put your left / right leg slightly back for balance. 2. Lean away from your left / right leg by bending your  other knee and shifting your weight toward your bent knee. You may rest your hands on your thigh for balance. You should feel a stretch in your left / right inner thigh. 3. Hold for __________ seconds. Repeat __________ times. Complete this exercise __________ times a day. Exercise C: Hamstring stretch, supine  1. Lie on your back. 2. Hold both ends of a belt or towel as you loop it over the ball of your left / right foot. The ball of your foot is on the walking surface, right under your toes. 3. Straighten your left / right knee and slowly pull on the belt to raise your leg. Stop when you feel a gentle stretch in the back of your left / right knee or thigh. ? Do not let your left / right knee bend. ? Keep your other leg flat on the floor. 4. Hold this position for __________ seconds. Repeat __________ times. Complete this exercise __________ times a day. Strengthening exercises These exercises  build strength and endurance in your leg. Endurance is the ability to use your muscles for a long time, even after they get tired. Exercise D: Quadriceps wall slides  1. Lean your back against a smooth wall or door while you walk your feet out 18-24 inches (46-61 cm) from it. 2. Place your feet hip-width apart. 3. Slowly slide down the wall or door until your knees bend as far as told by your health care provider. Keep your knees over your heels, not your toes. Keep your knees in line with your hips. 4. Hold for __________ seconds. 5. Push through your heels to stand up to rest for __________ seconds after each repetition. Repeat __________ times. Complete this exercise __________ times a day. Exercise E: Straight leg raises ( hip abductors) 1. Lie on your side, with your left / right leg in the top position. Lie so your head, shoulder, knee, and hip line up with each other. You may bend your bottom knee to help you balance. 2. Lift your top leg 4-6 inches (10-15 cm) while keeping your toes pointed straight ahead. 3. Hold this position for __________ seconds. 4. Slowly lower your leg to the starting position. Allow your muscles to relax completely after each repetition. Repeat __________ times. Complete this exercise __________ times a day. Exercise F: Straight leg raises ( hip extensors) 1. Lie on your abdomen on a firm surface. You can put a pillow under your hips if that is more comfortable. 2. Tense the muscles in your buttocks and lift your left / right leg about 4-6 inches (10-15 cm). Keep your knee straight as you lift your leg. 3. Hold this position for __________ seconds. 4. Slowly lower your leg to the starting position. 5. Let your leg relax completely after each repetition. Repeat __________ times. Complete this exercise __________ times a day. Exercise G: Bridge ( hip extensors) 1. Lie on your back on a firm surface with your knees bent and your feet flat on the  floor. 2. Tighten your buttocks muscles and lift your bottom off the floor until your trunk is level with your thighs. ? Do not arch your back. ? You should feel the muscles working in your buttocks and the back of your thighs. If you do not feel these muscles, slide your feet 1-2 inches (2.5-5 cm) farther away from your buttocks. 3. Hold this position for __________ seconds. 4. Slowly lower your hips to the starting position. 5. Let your buttocks muscles relax completely between repetitions.  6. If this exercise is too easy, try doing it with your arms crossed over your chest. Repeat __________ times. Complete this exercise __________ times a day. This information is not intended to replace advice given to you by your health care provider. Make sure you discuss any questions you have with your health care provider. Document Released: 08/14/2005 Document Revised: 04/20/2016 Document Reviewed: 07/27/2015 Elsevier Interactive Patient Education  [employer].

## 2017-05-24 ENCOUNTER — Telehealth: Payer: Self-pay | Admitting: *Deleted

## 2017-05-24 LAB — VITAMIN D 25 HYDROXY (VIT D DEFICIENCY, FRACTURES): VIT D 25 HYDROXY: 46 ng/mL (ref 30–100)

## 2017-05-24 MED ORDER — VITAMIN D (ERGOCALCIFEROL) 1.25 MG (50000 UNIT) PO CAPS: 50000.0000 [IU] | ORAL_CAPSULE | ORAL | 0 refills | Status: DC

## 2017-05-24 NOTE — Progress Notes (Signed)
Vitamin D is normal. She should continue vitamin D 50,000 units every 2 weeks.

## 2017-05-24 NOTE — Telephone Encounter (Signed)
-----   Message from Bo Merino, MD sent at 05/24/2017  1:01 PM EDT ----- Vitamin D is normal. She should continue vitamin D 50,000 units every 2 weeks.

## 2017-05-25 ENCOUNTER — Other Ambulatory Visit: Payer: Self-pay | Admitting: Internal Medicine

## 2017-05-25 DIAGNOSIS — N6489 Other specified disorders of breast: Secondary | ICD-10-CM

## 2017-05-30 ENCOUNTER — Other Ambulatory Visit: Payer: Self-pay | Admitting: Internal Medicine

## 2017-05-30 ENCOUNTER — Ambulatory Visit
Admission: RE | Admit: 2017-05-30 | Discharge: 2017-05-30 | Disposition: A | Discharge status: Home / Self Care | Payer: Medicare Other | Source: Ambulatory Visit | Attending: Internal Medicine | Admitting: Internal Medicine

## 2017-05-30 DIAGNOSIS — R922 Inconclusive mammogram: Secondary | ICD-10-CM | POA: Diagnosis not present

## 2017-05-30 DIAGNOSIS — N6489 Other specified disorders of breast: Secondary | ICD-10-CM | POA: Diagnosis not present

## 2017-05-30 DIAGNOSIS — N6310 Unspecified lump in the right breast, unspecified quadrant: Secondary | ICD-10-CM

## 2017-05-31 DIAGNOSIS — Z23 Encounter for immunization: Secondary | ICD-10-CM | POA: Diagnosis not present

## 2017-07-30 DIAGNOSIS — G43109 Migraine with aura, not intractable, without status migrainosus: Secondary | ICD-10-CM | POA: Diagnosis not present

## 2017-07-30 DIAGNOSIS — M47812 Spondylosis without myelopathy or radiculopathy, cervical region: Secondary | ICD-10-CM | POA: Diagnosis not present

## 2017-07-30 DIAGNOSIS — Z79891 Long term (current) use of opiate analgesic: Secondary | ICD-10-CM | POA: Diagnosis not present

## 2017-07-30 DIAGNOSIS — G894 Chronic pain syndrome: Secondary | ICD-10-CM | POA: Diagnosis not present

## 2017-08-15 ENCOUNTER — Encounter: Payer: Self-pay | Admitting: Internal Medicine

## 2017-08-15 ENCOUNTER — Ambulatory Visit (INDEPENDENT_AMBULATORY_CARE_PROVIDER_SITE_OTHER): Payer: Medicare Other | Admitting: Internal Medicine

## 2017-08-15 VITALS — BP 122/78 | HR 63 | Ht 66.0 in | Wt 179.2 lb

## 2017-08-15 DIAGNOSIS — G4733 Obstructive sleep apnea (adult) (pediatric): Secondary | ICD-10-CM

## 2017-08-15 MED ORDER — MODAFINIL 200 MG PO TABS: ORAL_TABLET | ORAL | 5 refills | Status: DC

## 2017-08-15 NOTE — Patient Instructions (Signed)
Order- DME [facility]   Please change CPAP pressure to 14 cwp, continue mask of choice, humidifier, supplies, AirView  Refill script printed for Provigil  Please call if we can help

## 2017-08-15 NOTE — Progress Notes (Signed)
Patient ID: Kari Flynn, female    DOB: 07/21/1951, 66 y.o.   MRN: [id number]  HPI F former smoker, followed for OSA, Insomnia, complicated by hx of fibromyalgia and migraine.  NPSG 01/10/11-AHI 27.7/hour, desaturation to 86% ------------------------------------------------------------  08/15/2016-66 year old female former smoker followed for OSA, insomnia, complicated by history fibromyalgia, migraine CPAP 11/[facility] FOLLOWS FOR: Wears CPAP nightly. Denies problems with mask/pressure. DME: [facility]. "Can't sleep without CPAP". Very comfortable with pressure, fullface mask and homecare company. No download available. Says Provigil also was a big help with her fibromyalgia. Daytime sleepiness controlled. Occasional nap.  08/15/17- 66 year old female former smoker followed for OSA, insomnia, complicated by history fibromyalgia, migraine CPAP 12/[facility]> today increase to 14 OSA DME: [facility]- Pt wears CPAP nightly. DL attached. No new supplies needed at this.  Provigil continues to be a significant help in managing her fibromyalgia. Download 93% compliance, AHI 7.0/0. But admits she is having trouble waking up and getting started in the mornings.  We discussed trying a pressure increase.  ROS-see HPI + = positive Constitutional:   No-   weight loss, night sweats, fevers, chills, +fatigue, lassitude. HEENT:   No-  headaches, difficulty swallowing, tooth/dental problems, sore throat,       No-  sneezing, itching, ear ache, nasal congestion, post nasal drip,  CV:  No-   chest pain, orthopnea, PND, swelling in lower extremities, anasarca,  dizziness, palpitations Resp: No- acute  shortness of breath with exertion or at rest.              No-   productive cough,  No non-productive cough,  No- coughing up of blood.              No-   change in color of mucus.  No- wheezing.   Skin: No-   rash or lesions. GI:  No-   heartburn, indigestion, abdominal pain, nausea,  vomiting,  GU:  MS:  No-   joint pain or swelling.   Neuro-     nothing unusual Psych:  No- change in mood or affect. No depression or anxiety.  No memory loss.   Objective:   Physical Exam General- Alert, Oriented, Affect-normal, Distress- none acute; medium build Skin- rash-none, lesions- none, excoriation- none Lymphadenopathy- none Head- atraumatic            Eyes- Gross vision intact, PERRLA, conjunctivae clear secretions; periorbital edema+ mild            Ears- Hearing, canals-normal            Nose- Clear, no-Septal dev, mucus, polyps, erosion, perforation             Throat- Mallampati III-IV , mucosa clear , drainage- none, tonsils- atrophic Neck- flexible , trachea midline, no stridor , thyroid nl, carotid no bruit Chest - symmetrical excursion , unlabored           Heart/CV- RRR , no murmur , no gallop  , no rub, nl s1 s2                           - JVD- none , edema- none, stasis changes- none, varices- none           Lung- clear to P&A, wheeze- none, cough- none , dullness-none, rub- none           Chest wall-  Abd-  Br/ Gen/ Rectal- Not done, not indicated Extrem- cyanosis-  none, clubbing, none, atrophy- none, strength- nl Neuro- grossly intact to observation

## 2017-08-15 NOTE — Assessment & Plan Note (Addendum)
Continues good compliance and improved sleep using CPAP.  AHI 7.0 so I would like to see if we can get a little better control.  Also notes she describes difficulty getting going in the mornings. Plan-increase CPAP to 14, okay to continue Provigil.

## 2017-10-01 DIAGNOSIS — G43109 Migraine with aura, not intractable, without status migrainosus: Secondary | ICD-10-CM | POA: Diagnosis not present

## 2017-10-01 DIAGNOSIS — Z79891 Long term (current) use of opiate analgesic: Secondary | ICD-10-CM | POA: Diagnosis not present

## 2017-10-01 DIAGNOSIS — G894 Chronic pain syndrome: Secondary | ICD-10-CM | POA: Diagnosis not present

## 2017-10-01 DIAGNOSIS — M47812 Spondylosis without myelopathy or radiculopathy, cervical region: Secondary | ICD-10-CM | POA: Diagnosis not present

## 2017-10-05 ENCOUNTER — Other Ambulatory Visit: Payer: Self-pay

## 2017-10-05 DIAGNOSIS — Z1329 Encounter for screening for other suspected endocrine disorder: Secondary | ICD-10-CM

## 2017-10-05 DIAGNOSIS — Z0000 Encounter for general adult medical examination without abnormal findings: Secondary | ICD-10-CM

## 2017-10-05 DIAGNOSIS — Z1321 Encounter for screening for nutritional disorder: Secondary | ICD-10-CM

## 2017-10-05 DIAGNOSIS — Z13220 Encounter for screening for lipoid disorders: Secondary | ICD-10-CM

## 2017-11-01 ENCOUNTER — Other Ambulatory Visit: Payer: Medicare Other | Admitting: Internal Medicine

## 2017-11-01 DIAGNOSIS — Z1321 Encounter for screening for nutritional disorder: Secondary | ICD-10-CM

## 2017-11-01 DIAGNOSIS — Z1329 Encounter for screening for other suspected endocrine disorder: Secondary | ICD-10-CM

## 2017-11-01 DIAGNOSIS — Z0000 Encounter for general adult medical examination without abnormal findings: Secondary | ICD-10-CM

## 2017-11-01 DIAGNOSIS — Z13220 Encounter for screening for lipoid disorders: Secondary | ICD-10-CM | POA: Diagnosis not present

## 2017-11-02 LAB — CBC WITH DIFFERENTIAL/PLATELET
BASOS ABS: 42 {cells}/uL (ref 0–200)
Basophils Relative: 0.9 %
EOS PCT: 1.3 %
Eosinophils Absolute: 61 cells/uL (ref 15–500)
HEMATOCRIT: 39.4 % (ref 35.0–45.0)
HEMOGLOBIN: 13.4 g/dL (ref 11.7–15.5)
LYMPHS ABS: 1025 {cells}/uL (ref 850–3900)
MCH: 30.4 pg (ref 27.0–33.0)
MCHC: 34 g/dL (ref 32.0–36.0)
MCV: 89.3 fL (ref 80.0–100.0)
MPV: 10.2 fL (ref 7.5–12.5)
Monocytes Relative: 9.2 %
NEUTROS ABS: 3140 {cells}/uL (ref 1500–7800)
Neutrophils Relative %: 66.8 %
Platelets: 242 10*3/uL (ref 140–400)
RBC: 4.41 10*6/uL (ref 3.80–5.10)
RDW: 12.3 % (ref 11.0–15.0)
Total Lymphocyte: 21.8 %
WBC: 4.7 10*3/uL (ref 3.8–10.8)
WBCMIX: 432 {cells}/uL (ref 200–950)

## 2017-11-02 LAB — COMPLETE METABOLIC PANEL WITH GFR
AG Ratio: 1.6 (calc) (ref 1.0–2.5)
ALBUMIN MSPROF: 4.2 g/dL (ref 3.6–5.1)
ALT: 14 U/L (ref 6–29)
AST: 17 U/L (ref 10–35)
Alkaline phosphatase (APISO): 84 U/L (ref 33–130)
BILIRUBIN TOTAL: 0.9 mg/dL (ref 0.2–1.2)
BUN: 16 mg/dL (ref 7–25)
CALCIUM: 9.6 mg/dL (ref 8.6–10.4)
CHLORIDE: 104 mmol/L (ref 98–110)
CO2: 27 mmol/L (ref 20–32)
CREATININE: 0.72 mg/dL (ref 0.50–0.99)
GFR, EST AFRICAN AMERICAN: 100 mL/min/{1.73_m2} (ref >=60)
GFR, EST NON AFRICAN AMERICAN: 86 mL/min/{1.73_m2} (ref >=60)
GLUCOSE: 91 mg/dL (ref 65–99)
Globulin: 2.6 g/dL (calc) (ref 1.9–3.7)
Potassium: 4 mmol/L (ref 3.5–5.3)
Sodium: 141 mmol/L (ref 135–146)
Total Protein: 6.8 g/dL (ref 6.1–8.1)

## 2017-11-02 LAB — LIPID PANEL
Cholesterol: 177 mg/dL (ref <200)
HDL: 82 mg/dL (ref >50)
LDL Cholesterol (Calc): 81 mg/dL (calc)
NON-HDL CHOLESTEROL (CALC): 95 mg/dL (ref <130)
Total CHOL/HDL Ratio: 2.2 (calc) (ref <5.0)
Triglycerides: 65 mg/dL (ref <150)

## 2017-11-02 LAB — VITAMIN D 25 HYDROXY (VIT D DEFICIENCY, FRACTURES): VIT D 25 HYDROXY: 40 ng/mL (ref 30–100)

## 2017-11-02 LAB — TSH: TSH: 1.82 m[IU]/L (ref 0.40–4.50)

## 2017-11-05 ENCOUNTER — Encounter: Payer: Self-pay | Admitting: Internal Medicine

## 2017-11-05 ENCOUNTER — Ambulatory Visit (INDEPENDENT_AMBULATORY_CARE_PROVIDER_SITE_OTHER): Payer: Medicare Other | Admitting: Internal Medicine

## 2017-11-05 VITALS — BP 140/70 | HR 84 | Ht 65.25 in | Wt 177.0 lb

## 2017-11-05 DIAGNOSIS — Z8669 Personal history of other diseases of the nervous system and sense organs: Secondary | ICD-10-CM | POA: Diagnosis not present

## 2017-11-05 DIAGNOSIS — H8110 Benign paroxysmal vertigo, unspecified ear: Secondary | ICD-10-CM | POA: Diagnosis not present

## 2017-11-05 DIAGNOSIS — M170 Bilateral primary osteoarthritis of knee: Secondary | ICD-10-CM | POA: Diagnosis not present

## 2017-11-05 DIAGNOSIS — K5903 Drug induced constipation: Secondary | ICD-10-CM | POA: Diagnosis not present

## 2017-11-05 DIAGNOSIS — M797 Fibromyalgia: Secondary | ICD-10-CM

## 2017-11-05 DIAGNOSIS — Z0000 Encounter for general adult medical examination without abnormal findings: Secondary | ICD-10-CM | POA: Diagnosis not present

## 2017-11-05 DIAGNOSIS — T402X5A Adverse effect of other opioids, initial encounter: Secondary | ICD-10-CM | POA: Diagnosis not present

## 2017-11-05 DIAGNOSIS — Z23 Encounter for immunization: Secondary | ICD-10-CM

## 2017-11-05 DIAGNOSIS — G4733 Obstructive sleep apnea (adult) (pediatric): Secondary | ICD-10-CM | POA: Diagnosis not present

## 2017-11-05 MED ORDER — HYOSCYAMINE SULFATE 0.125 MG SL SUBL: 0.1250 mg | SUBLINGUAL_TABLET | SUBLINGUAL | 2 refills | Status: DC | PRN

## 2017-11-05 MED ORDER — MECLIZINE HCL 25 MG PO TABS: 25.0000 mg | ORAL_TABLET | Freq: Three times a day (TID) | ORAL | 0 refills | Status: AC | PRN

## 2017-11-05 MED ORDER — MECLIZINE HCL 32 MG PO TABS: 32.0000 mg | ORAL_TABLET | Freq: Three times a day (TID) | ORAL | 0 refills | Status: DC | PRN

## 2017-11-05 NOTE — Progress Notes (Signed)
Subjective:    Patient ID: Kari Flynn, female    DOB: [birth year], 67 y.o.   MRN: [id number]  HPI 67 year old Female for Medicare wellness, health maintenance and evalution of medical issues.  She receives disability benefits for history of migraine headaches and fibromyalgia syndrome.  She has had significant issues with constipation aggravated by pain medication.  Fibromyalgia and knee arthritis followed by Dr. Estanislado Pandy, Rheumatologist.  Has knee injections on an as-needed basis.  She has sleep apnea and uses a CPAP device.  She is on Provigil, Flexeril, methadone,  Robaxin, and as needed Frova.  Colonoscopy done by Dr. Olevia Perches 2015 for right lower quadrant abdominal pain which was normal.  Infected laceration right leg during the Summer 2015.  Past medical history: D&C 1979, bilateral tubal ligation 1987, bunionectomy 1990, benign right breast biopsy February 1995.  Family history: Brother had MI with history of diabetes and now has chronic kidney disease after MI.  Mother died 09/20/2013 of congestive heart failure.  Mother died of multiple myeloma.  Mother had hypertension and gout.   Review of Systems recently has had some issues with lightheadedness and dizziness.     Objective:   Physical Exam  Constitutional: She is oriented to person, place, and time. She appears well-developed and well-nourished. No distress.  HENT:  Head: Normocephalic and atraumatic.  Right Ear: External ear normal.  Left Ear: External ear normal.  Mouth/Throat: Oropharynx is clear and moist. No oropharyngeal exudate.  Eyes: Pupils are equal, round, and reactive to light. Conjunctivae and EOM are normal. Right eye exhibits no discharge. Left eye exhibits no discharge. No scleral icterus.  Neck: Neck supple. No JVD present. No thyromegaly present.  Cardiovascular: Normal rate, regular rhythm, normal heart sounds and intact distal pulses.  No murmur heard. Pulmonary/Chest: Effort normal and breath  sounds normal. No respiratory distress. She has no wheezes. She has no rales. She exhibits no tenderness.  Breasts normal female with fibrocystic changes noted  Abdominal: Soft. Bowel sounds are normal. She exhibits no distension and no mass. There is no tenderness. There is no rebound and no guarding.  Genitourinary:  Genitourinary Comments: Pap done 2018.  Does not need to be repeated due to age.  Bimanual normal.  Musculoskeletal: She exhibits no edema.  Lymphadenopathy:    She has no cervical adenopathy.  Neurological: She is alert and oriented to person, place, and time. She has normal reflexes. No cranial nerve deficit. Coordination normal.  Skin: Skin is warm and dry. No rash noted. She is not diaphoretic.  Psychiatric: She has a normal mood and affect. Her behavior is normal. Judgment and thought content normal.  Vitals reviewed.         Assessment & Plan:  Fibromyalgia syndrome-treated by rheumatologist  Migraine headaches-under chronic pain management  Chronic pain management been for [facility]  Primary osteoarthritis of both knees and fibromyalgia syndrome treated by rheumatologist  Opioid-induced constipation  Benign positional vertigo-will be prescribed Antivert  Plan: Continue same medications and return in 1 year or as needed.  Prevnar 23 given today.  Labs reviewed and are entirely within normal limits.  They have been reviewed with patient today.  Subjective:   Patient presents for Medicare Annual/Subsequent preventive examination.  Review Past Medical/Family/Social: See above   Risk Factors  Current exercise habits: Low-fat low carbohydrate Dietary issues discussed: Tries to get some exercise as tolerated  Cardiac risk factors: Family history  Depression Screen  (Note: if answer to  either of the following is "Yes", a more complete depression screening is indicated)   Over the past two weeks, have you felt down, depressed or hopeless?  No  Over the past two weeks, have you felt little interest or pleasure in doing things? No Have you lost interest or pleasure in daily life? No Do you often feel hopeless? No Do you cry easily over simple problems? No   Activities of Daily Living  In your present state of health, do you have any difficulty performing the following activities?:   Driving? No  Managing money? No  Feeding yourself? No  Getting from bed to chair? No  Climbing a flight of stairs? No  Preparing food and eating?: No  Bathing or showering? No  Getting dressed: No  Getting to the toilet? No  Using the toilet:No  Moving around from place to place: No  In the past year have you fallen or had a near fall?:one Are you sexually active? yes Do you have more than one partner? No   Hearing Difficulties: No  Do you often ask people to speak up or repeat themselves? No  Do you experience ringing or noises in your ears? No  Do you have difficulty understanding soft or whispered voices? No  Do you feel that you have a problem with memory?  Occasionally Do you often misplace items? No    Home Safety:  Do you have a smoke alarm at your residence? Yes Do you have grab bars in the bathroom?  No Do you have throw rugs in your house?  Yes   Cognitive Testing  Alert? Yes Normal Appearance?Yes  Oriented to person? Yes Place? Yes  Time? Yes  Recall of three objects? Yes  Can perform simple calculations? Yes  Displays appropriate judgment?Yes  Can read the correct time from a watch face?Yes   List the Names of Other Physician/Practitioners you currently use:  See referral list for the physicians patient is currently seeing.     Review of Systems: See above   Objective:     General appearance: Appears  younger than stated age Head: Normocephalic, without obvious abnormality, atraumatic  Eyes: conj clear, EOMi PEERLA  Ears: normal TM's and external ear canals both ears  Nose: Nares normal. Septum  midline. Mucosa normal. No drainage or sinus tenderness.  Throat: lips, mucosa, and tongue normal; teeth and gums normal  Neck: no adenopathy, no carotid bruit, no JVD, supple, symmetrical, trachea midline and thyroid not enlarged, symmetric, no tenderness/mass/nodules  No CVA tenderness.  Lungs: clear to auscultation bilaterally  Breasts: normal appearance, no masses or tenderness Heart: regular rate and rhythm, S1, S2 normal, no murmur, click, rub or gallop  Abdomen: soft, non-tender; bowel sounds normal; no masses, no organomegaly  Musculoskeletal: ROM normal in all joints, no crepitus, no deformity, Normal muscle strengthen. Back  is symmetric, no curvature. Skin: Skin color, texture, turgor normal. No rashes or lesions  Lymph nodes: Cervical, supraclavicular, and axillary nodes normal.  Neurologic: CN 2 -12 Normal, Normal symmetric reflexes. Normal coordination and gait  Psych: Alert & Oriented x 3, Mood appear stable.    Assessment:    Annual wellness medicare exam   Plan:    During the course of the visit the patient was educated and counseled about appropriate screening and preventive services including:   See above    Patient Instructions (the written plan) was given to the patient.  Medicare Attestation  I have personally reviewed:  The patient's medical  and social history  Their use of alcohol, tobacco or illicit drugs  Their current medications and supplements  The patient's functional ability including ADLs,fall risks, home safety risks, cognitive, and hearing and visual impairment  Diet and physical activities  Evidence for depression or mood disorders  The patient's weight, height, BMI, and visual acuity have been recorded in the chart. I have made referrals, counseling, and provided education to the patient based on review of the above and I have provided the patient with a written personalized care plan for preventive services.

## 2017-11-06 LAB — POCT URINALYSIS DIPSTICK
APPEARANCE: NORMAL
Bilirubin, UA: NEGATIVE
Blood, UA: NEGATIVE
GLUCOSE UA: NEGATIVE
Ketones, UA: NEGATIVE
Leukocytes, UA: NEGATIVE
Nitrite, UA: NEGATIVE
ODOR: NORMAL
PH UA: 6.5 (ref 5.0–8.0)
Protein, UA: NEGATIVE
Spec Grav, UA: 1.015 (ref 1.010–1.025)
UROBILINOGEN UA: 0.2 U/dL

## 2017-11-07 NOTE — Progress Notes (Signed)
Office Visit Note  Patient: Kari Flynn             Date of Birth: June 18, 1951           MRN: [id number]             PCP: Elby Showers, MD Referring: Elby Showers, MD Visit Date: 11/20/2017 Occupation: _0 @    Subjective:  Medication Management. Pain in hips.   History of Present Illness: Kari Flynn is a 67 y.o. female with history of osteoarthritis and fibromyalgia syndrome.  She states she has been having increased pain and discomfort over her bilateral hips.  She has difficulty sleeping on her side.  Her knee joints are not causing much discomfort currently.  She is under a lot of stress due to her younger brothers health.  She is caregiver for him.  Which is causing increased discomfort all over.  She believes her fibromyalgia is flaring.  Activities of Daily Living:  Patient reports morning stiffness for 2 hours.   Patient Reports nocturnal pain.  Difficulty dressing/grooming: Denies Difficulty climbing stairs: Reports Difficulty getting out of chair: Denies Difficulty using hands for taps, buttons, cutlery, and/or writing: Denies   Review of Systems  Constitutional: Positive for fatigue. Negative for night sweats, weight gain and weight loss.  HENT: Negative for mouth sores, trouble swallowing, trouble swallowing, mouth dryness and nose dryness.   Eyes: Negative for pain, redness, visual disturbance and dryness.  Respiratory: Negative for cough, shortness of breath and difficulty breathing.   Cardiovascular: Negative for chest pain, palpitations, hypertension, irregular heartbeat and swelling in legs/feet.  Gastrointestinal: Negative for blood in stool, constipation and diarrhea.  Endocrine: Negative for increased urination.  Genitourinary: Negative for vaginal dryness.  Musculoskeletal: Positive for arthralgias, joint pain, myalgias, morning stiffness and myalgias. Negative for joint swelling, muscle weakness and muscle tenderness.  Skin: Negative for color  change, rash, hair loss, skin tightness, ulcers and sensitivity to sunlight.  Allergic/Immunologic: Negative for susceptible to infections.  Neurological: Negative for dizziness, memory loss, night sweats and weakness.  Hematological: Negative for swollen glands.  Psychiatric/Behavioral: Negative for depressed mood and sleep disturbance. The patient is not nervous/anxious.     PMFS History:  Patient Active Problem List   Diagnosis Date Noted  . Osteoarthritis of both knees 03/22/2014  . Unspecified constipation 10/16/2013  . Insomnia 06/23/2010  . Obstructive sleep apnea 06/25/2008  . MIGRAINE HEADACHE 06/25/2008  . Fibromyalgia 06/25/2008  . Shortness of breath 06/25/2008    Past Medical History:  Diagnosis Date  . Arthritis   . Dyspnea   . Lump of breast, left 11/08/2016   scheduled for a diagnostic/US mammo bilateral and stereo, US aspiration and ductogram.   . Migraine, unspecified, without mention of intractable migraine without mention of status migrainosus   . Myalgia and myositis, unspecified   . Obstructive sleep apnea (adult) (pediatric)     Family History  Problem Relation Age of Onset  . Hypertension Mother   . Arthritis Mother   . Hyperlipidemia Mother   . Melanoma Mother   . Diverticulitis Mother   . Multiple myeloma Father   . Arthritis Unknown        sibling  . Sarcoidosis Unknown        sibling-also has NIDDM  . Diabetes Brother   . Sarcoidosis Brother   . Diabetes Paternal Grandfather   . Colon cancer Neg Hx   . Esophageal cancer Neg Hx   . Rectal cancer Neg  Hx   . Stomach cancer Neg Hx    Past Surgical History:  Procedure Laterality Date  . BLEPHAROPLASTY    . BREAST BIOPSY     B9  . BUNIONECTOMY     x2  . WISDOM TOOTH EXTRACTION     Social History   Social History Narrative  . Not on file     Objective: Vital Signs: BP 131/75 (BP Location: Left Arm, Patient Position: Sitting, Cuff Size: Small)   Pulse 80   Resp 14   Wt 180 lb  (81.6 kg)   BMI 29.72 kg/m    Physical Exam  Constitutional: She is oriented to person, place, and time. She appears well-developed and well-nourished.  HENT:  Head: Normocephalic and atraumatic.  Eyes: Conjunctivae and EOM are normal.  Neck: Normal range of motion.  Cardiovascular: Normal rate, regular rhythm, normal heart sounds and intact distal pulses.  Pulmonary/Chest: Effort normal and breath sounds normal.  Abdominal: Soft. Bowel sounds are normal.  Lymphadenopathy:    She has no cervical adenopathy.  Neurological: She is alert and oriented to person, place, and time.  Skin: Skin is warm and dry. Capillary refill takes less than 2 seconds.  Psychiatric: She has a normal mood and affect. Her behavior is normal.  Nursing note and vitals reviewed.    Musculoskeletal Exam: C-spine thoracic lumbar spine good range of motion.  Shoulder joints elbow joints wrist joint MCPs PIPs DIPs with good range of motion.  No synovitis was noted.  Hip joints knee joints ankles MTPs PIPs were in good range of motion.  She has tenderness over bilateral trochanteric bursa consistent with trochanteric bursitis.  CDAI Exam: No CDAI exam completed.    Investigation: No additional findings. CBC Latest Ref Rng & Units 11/01/2017 10/27/2016 01/11/2016  WBC 3.8 - 10.8 Thousand/uL 4.7 3.8 5.3  Hemoglobin 11.7 - 15.5 g/dL 13.4 13.9 14.0  Hematocrit 35.0 - 45.0 % 39.4 41.8 41.7  Platelets 140 - 400 Thousand/uL 242 194 259   CMP Latest Ref Rng & Units 11/01/2017 10/27/2016 10/21/2015  Glucose 65 - 99 mg/dL 91 79 69  BUN 7 - 25 mg/dL _0 Creatinine 0.50 - 0.99 mg/dL 0.72 0.68 0.68  Sodium 135 - 146 mmol/L 141 141 142  Potassium 3.5 - 5.3 mmol/L 4.0 4.5 4.5  Chloride 98 - 110 mmol/L 104 103 103  CO2 20 - 32 mmol/L _1 Calcium 8.6 - 10.4 mg/dL 9.6 9.4 9.4  Total Protein 6.1 - 8.1 g/dL 6.8 6.6 6.6  Total Bilirubin 0.2 - 1.2 mg/dL 0.9 1.0 0.7  Alkaline Phos 33 - 130 U/L - 85 85  AST 10 - 35 U/L _2 ALT 6 - 29 U/L _3 Imaging: No results found.  Speciality Comments: No specialty comments available.    Procedures:  Large Joint Inj: bilateral greater trochanter on 11/20/2017 1:49 PM Indications: pain Details: 27 G 1.5 in needle, lateral approach  Arthrogram: No  Medications (Right): 1.5 mL lidocaine (PF) 1 %; 40 mg triamcinolone acetonide 40 MG/ML Medications (Left): 1.5 mL lidocaine (PF) 1 %; 40 mg triamcinolone acetonide 40 MG/ML Outcome: tolerated well, no immediate complications Procedure, treatment alternatives, risks and benefits explained, specific risks discussed. Consent was given by the patient. Immediately prior to procedure a time out was called to verify the correct patient, procedure, equipment, support staff and site/side marked as required. Patient was prepped and draped in the usual sterile fashion.  Allergies: Divalproex sodium   Assessment / Plan:     Visit Diagnoses: Primary osteoarthritis of both hands: She continues to have some stiffness in her hands but no increased swelling.  Joint protection muscle strengthening discussed.  Primary osteoarthritis of both knees - BIL Hyalgan 03/2017-04/2017.  She continues to do well after Visco supplement injections.  Fibromyalgia: She is having a flare of fibromyalgia due to stress.  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  Trochanteric bursitis of both hips: She has been having severe pain and discomfort over bilateral trochanteric bursa area.  Different treatment options were discussed and bilateral trochanteric area were injected with cortisone as described above.  She tolerated the procedure well.  History of vitamin D deficiency: Patient states her most recent vitamin D level was 40.  I have advised her to take vitamin D 2000 units on daily basis.  We can check it again in 6 months.  History of insomnia  History of fatigue  History of sleep apnea  History of  migraine  History of depression    Orders: Orders Placed This Encounter  Procedures  . Large Joint Inj   No orders of the defined types were placed in this encounter.   Face-to-face time spent with patient was 30 minutes.  Greater than 50% of time was spent in counseling and coordination of care.  Follow-Up Instructions: Return in about 6 months (around 05/23/2018) for Osteoarthritis,FMS.   Bo Merino, MD  Note - This record has been created using Editor, commissioning.  Chart creation errors have been sought, but may not always  have been located. Such creation errors do not reflect on  the standard of medical care.

## 2017-11-20 ENCOUNTER — Encounter: Payer: Self-pay | Admitting: Rheumatology

## 2017-11-20 ENCOUNTER — Ambulatory Visit (INDEPENDENT_AMBULATORY_CARE_PROVIDER_SITE_OTHER): Payer: Medicare Other | Admitting: Rheumatology

## 2017-11-20 VITALS — BP 131/75 | HR 80 | Resp 14 | Wt 180.0 lb

## 2017-11-20 DIAGNOSIS — M19042 Primary osteoarthritis, left hand: Secondary | ICD-10-CM

## 2017-11-20 DIAGNOSIS — Z8669 Personal history of other diseases of the nervous system and sense organs: Secondary | ICD-10-CM

## 2017-11-20 DIAGNOSIS — M19041 Primary osteoarthritis, right hand: Secondary | ICD-10-CM | POA: Diagnosis not present

## 2017-11-20 DIAGNOSIS — M797 Fibromyalgia: Secondary | ICD-10-CM

## 2017-11-20 DIAGNOSIS — Z87898 Personal history of other specified conditions: Secondary | ICD-10-CM

## 2017-11-20 DIAGNOSIS — Z8659 Personal history of other mental and behavioral disorders: Secondary | ICD-10-CM

## 2017-11-20 DIAGNOSIS — Z8639 Personal history of other endocrine, nutritional and metabolic disease: Secondary | ICD-10-CM

## 2017-11-20 DIAGNOSIS — M7061 Trochanteric bursitis, right hip: Secondary | ICD-10-CM

## 2017-11-20 DIAGNOSIS — M170 Bilateral primary osteoarthritis of knee: Secondary | ICD-10-CM | POA: Diagnosis not present

## 2017-11-20 DIAGNOSIS — M7062 Trochanteric bursitis, left hip: Secondary | ICD-10-CM

## 2017-11-20 MED ORDER — TRIAMCINOLONE ACETONIDE 40 MG/ML IJ SUSP
40.0000 mg | INTRAMUSCULAR | Status: AC | PRN
  Administered 2017-11-20: 40 mg via INTRA_ARTICULAR

## 2017-11-20 MED ORDER — LIDOCAINE HCL (PF) 1 % IJ SOLN
1.5000 mL | INTRAMUSCULAR | Status: AC | PRN
  Administered 2017-11-20: 1.5 mL

## 2017-11-25 NOTE — Patient Instructions (Addendum)
Pneumococcal 23 given.  Continue same medications and return in 1 year or as needed.  For vertigo will take Antivert 25 mg up to 3 times daily as needed.

## 2017-11-26 DIAGNOSIS — M47812 Spondylosis without myelopathy or radiculopathy, cervical region: Secondary | ICD-10-CM | POA: Diagnosis not present

## 2017-11-26 DIAGNOSIS — Z79891 Long term (current) use of opiate analgesic: Secondary | ICD-10-CM | POA: Diagnosis not present

## 2017-11-26 DIAGNOSIS — G894 Chronic pain syndrome: Secondary | ICD-10-CM | POA: Diagnosis not present

## 2017-11-26 DIAGNOSIS — G43109 Migraine with aura, not intractable, without status migrainosus: Secondary | ICD-10-CM | POA: Diagnosis not present

## 2017-11-30 ENCOUNTER — Other Ambulatory Visit: Payer: Self-pay | Admitting: Internal Medicine

## 2017-11-30 DIAGNOSIS — N6310 Unspecified lump in the right breast, unspecified quadrant: Secondary | ICD-10-CM

## 2017-12-03 ENCOUNTER — Other Ambulatory Visit: Payer: Self-pay | Admitting: Rheumatology

## 2017-12-04 ENCOUNTER — Other Ambulatory Visit: Payer: Medicare Other

## 2017-12-06 ENCOUNTER — Ambulatory Visit
Admission: RE | Admit: 2017-12-06 | Discharge: 2017-12-06 | Disposition: A | Discharge status: Home / Self Care | Payer: Medicare Other | Source: Ambulatory Visit | Attending: Internal Medicine | Admitting: Internal Medicine

## 2017-12-06 DIAGNOSIS — R922 Inconclusive mammogram: Secondary | ICD-10-CM | POA: Diagnosis not present

## 2017-12-06 DIAGNOSIS — N6310 Unspecified lump in the right breast, unspecified quadrant: Secondary | ICD-10-CM

## 2017-12-06 DIAGNOSIS — N6313 Unspecified lump in the right breast, lower outer quadrant: Secondary | ICD-10-CM | POA: Diagnosis not present

## 2017-12-25 DIAGNOSIS — H02402 Unspecified ptosis of left eyelid: Secondary | ICD-10-CM | POA: Diagnosis not present

## 2017-12-25 DIAGNOSIS — H43813 Vitreous degeneration, bilateral: Secondary | ICD-10-CM | POA: Diagnosis not present

## 2017-12-25 DIAGNOSIS — H524 Presbyopia: Secondary | ICD-10-CM | POA: Diagnosis not present

## 2017-12-25 DIAGNOSIS — H2513 Age-related nuclear cataract, bilateral: Secondary | ICD-10-CM | POA: Diagnosis not present

## 2018-01-03 ENCOUNTER — Other Ambulatory Visit: Payer: Self-pay | Admitting: Rheumatology

## 2018-01-03 NOTE — Telephone Encounter (Signed)
Last visit: 11/20/17 Next Visit: 05/23/18  Okay to refill per Dr. Estanislado Pandy

## 2018-01-28 DIAGNOSIS — M47812 Spondylosis without myelopathy or radiculopathy, cervical region: Secondary | ICD-10-CM | POA: Diagnosis not present

## 2018-01-28 DIAGNOSIS — Z79891 Long term (current) use of opiate analgesic: Secondary | ICD-10-CM | POA: Diagnosis not present

## 2018-01-28 DIAGNOSIS — G43109 Migraine with aura, not intractable, without status migrainosus: Secondary | ICD-10-CM | POA: Diagnosis not present

## 2018-01-28 DIAGNOSIS — G894 Chronic pain syndrome: Secondary | ICD-10-CM | POA: Diagnosis not present

## 2018-02-20 ENCOUNTER — Other Ambulatory Visit: Payer: Self-pay | Admitting: Internal Medicine

## 2018-02-20 NOTE — Telephone Encounter (Signed)
Please advise if okay to refill the provigil, thanks

## 2018-02-22 ENCOUNTER — Telehealth: Payer: Self-pay | Admitting: Internal Medicine

## 2018-02-22 NOTE — Telephone Encounter (Signed)
Called and left message for Patient to return call.  CY is not in the office today and wanted to see how soon Patient needed prescription.

## 2018-02-24 NOTE — Telephone Encounter (Signed)
Ok refill total 6 months

## 2018-02-25 MED ORDER — MODAFINIL 200 MG PO TABS: ORAL_TABLET | ORAL | 5 refills | Status: DC

## 2018-02-25 NOTE — Telephone Encounter (Signed)
Medication has been called into [facility] on [address]. Spoke with patient, she is aware. Nothing else needed at time of call.

## 2018-02-25 NOTE — Telephone Encounter (Signed)
Spoke with patient. She is requesting a refill on her modafinil 200mg .   Last OV: 08/15/17 Next OV: 08/15/18 at 10am Last Refill: 08/15/17 for 30 tablets, 5 refills  CY, please advise if a refill will be appropriate for patient. Thanks!

## 2018-02-25 NOTE — Telephone Encounter (Signed)
Patient returned phone call; used last dosage of medication today; requesting a refill in order to take tomorrow's dosage. Pt contact [phone]

## 2018-02-25 NOTE — Telephone Encounter (Signed)
Ok to refill total 6 months 

## 2018-02-25 NOTE — Telephone Encounter (Signed)
Attempted to call pt. I did not receive an answer. I have left a message for pt to return our call.  

## 2018-03-28 DIAGNOSIS — G43109 Migraine with aura, not intractable, without status migrainosus: Secondary | ICD-10-CM | POA: Diagnosis not present

## 2018-03-28 DIAGNOSIS — Z79891 Long term (current) use of opiate analgesic: Secondary | ICD-10-CM | POA: Diagnosis not present

## 2018-03-28 DIAGNOSIS — G894 Chronic pain syndrome: Secondary | ICD-10-CM | POA: Diagnosis not present

## 2018-03-28 DIAGNOSIS — M47812 Spondylosis without myelopathy or radiculopathy, cervical region: Secondary | ICD-10-CM | POA: Diagnosis not present

## 2018-05-10 NOTE — Progress Notes (Signed)
Office Visit Note  Patient: Kari Flynn             Date of Birth: Oct 22, 1950           MRN: [id number]             PCP: Elby Showers, MD Referring: Elby Showers, MD Visit Date: 05/23/2018 Occupation: '@GUAROCC' @  Subjective:  Bilateral trochanteric bursitis   History of Present Illness: Kari Flynn is a 67 y.o. female with history of fibromyalgia and osteoarthritis. She continues to have generalized muscle aches and muscle tenderness.  She continues to have trapezius muscle tension and muscle tenderness.  She ranks her pain 6/10.  She takes Robaxin 500 mg PRN for muscle spasms.  She is on Methadone 5 mg TID for management of migraines.  She states the methadone manages her pain level.  She states she has been having worsening bilateral trochanteric bursitis for the past 4-5 months.  She recently started a new exercise program and is doing chair exercises twice a week and yoga once a week.  She states that she is already no significant benefit since going to discuss this.  She states that she continues to have interrupted sleep at night.  Her fatigue level has been stable.  She has been having increased discomfort in bilateral feet for the past 3 to 4 months.  She denies any joint swelling.  She states the pain is more severe on the dorsal aspect of her right foot.  She denies any injuries.  She states that she tries to wear supportive shoes which helps.  Activities of Daily Living:  Patient reports morning stiffness for2 hours.   Patient Denies nocturnal pain.  Difficulty dressing/grooming: Denies Difficulty climbing stairs: Reports Difficulty getting out of chair: Reports Difficulty using hands for taps, buttons, cutlery, and/or writing: Denies  Review of Systems  Constitutional: Positive for fatigue.  HENT: Negative for mouth sores, mouth dryness and nose dryness.   Eyes: Negative for pain, visual disturbance and dryness.  Respiratory: Negative for cough, hemoptysis, shortness  of breath and difficulty breathing.   Cardiovascular: Negative for chest pain, palpitations, hypertension and swelling in legs/feet.  Gastrointestinal: Negative for blood in stool, constipation and diarrhea.  Endocrine: Negative for increased urination.  Genitourinary: Negative for difficulty urinating and painful urination.  Musculoskeletal: Positive for arthralgias, joint pain, myalgias, muscle weakness, morning stiffness and myalgias. Negative for joint swelling and muscle tenderness.  Skin: Negative for color change, pallor, rash, hair loss, nodules/bumps, skin tightness, ulcers and sensitivity to sunlight.  Allergic/Immunologic: Negative for susceptible to infections.  Neurological: Positive for headaches. Negative for dizziness and numbness.  Hematological: Negative for swollen glands.  Psychiatric/Behavioral: Negative for depressed mood and sleep disturbance. The patient is not nervous/anxious.     PMFS History:  Patient Active Problem List   Diagnosis Date Noted  . Osteoarthritis of both knees 03/22/2014  . Unspecified constipation 10/16/2013  . Insomnia 06/23/2010  . Obstructive sleep apnea 06/25/2008  . MIGRAINE HEADACHE 06/25/2008  . Fibromyalgia 06/25/2008  . Shortness of breath 06/25/2008    Past Medical History:  Diagnosis Date  . Arthritis   . Dyspnea   . Lump of breast, left 11/08/2016   scheduled for a diagnostic/US mammo bilateral and stereo, US aspiration and ductogram.   . Migraine, unspecified, without mention of intractable migraine without mention of status migrainosus   . Myalgia and myositis, unspecified   . Obstructive sleep apnea (adult) (pediatric)     Family  History  Problem Relation Age of Onset  . Hypertension Mother   . Arthritis Mother   . Hyperlipidemia Mother   . Melanoma Mother   . Diverticulitis Mother   . Multiple myeloma Father   . Arthritis Unknown        sibling  . Sarcoidosis Unknown        sibling-also has NIDDM  . Diabetes  Brother   . Sarcoidosis Brother   . Diabetes Paternal Grandfather   . Colon cancer Neg Hx   . Esophageal cancer Neg Hx   . Rectal cancer Neg Hx   . Stomach cancer Neg Hx    Past Surgical History:  Procedure Laterality Date  . BLEPHAROPLASTY    . BREAST BIOPSY     B9  . BUNIONECTOMY     x2  . WISDOM TOOTH EXTRACTION     Social History   Social History Narrative  . Not on file    Objective: Vital Signs: BP 128/66 (BP Location: Left Arm, Patient Position: Sitting, Cuff Size: Normal)   Pulse 92   Ht 5' 5.25" (1.657 m)   Wt 177 lb 8 oz (80.5 kg)   BMI 29.31 kg/m    Physical Exam  Constitutional: She is oriented to person, place, and time. She appears well-developed and well-nourished.  HENT:  Head: Normocephalic and atraumatic.  Eyes: Conjunctivae and EOM are normal.  Neck: Normal range of motion.  Cardiovascular: Normal rate, regular rhythm, normal heart sounds and intact distal pulses.  Pulmonary/Chest: Effort normal and breath sounds normal.  Abdominal: Soft. Bowel sounds are normal.  Lymphadenopathy:    She has no cervical adenopathy.  Neurological: She is alert and oriented to person, place, and time.  Skin: Skin is warm and dry. Capillary refill takes less than 2 seconds.  Psychiatric: She has a normal mood and affect. Her behavior is normal.  Nursing note and vitals reviewed.    Musculoskeletal Exam: C-spine, thoracic spine, and lumbar spine good ROM. Trapezius muscle tension.  Thickening of left sternoclavicular joint.  Shoulder joints, elbow joints, wrist joints, MCPs, PIPs, and DIPs good ROM with no synovitis.  Mucin cyst on 1st DIP of right hand. DIP synovial thickening bilaterally.  Hip joints, knee joints, ankle joints, MTPs, PIPs and DIPs good ROM.  No warmth or effusion of knee joints. Tenderness on the dorsal aspect of both feet.  No tenderness or swelling of ankle joints.  Tenderness of bilateral trochanteric bursa.     CDAI Exam: CDAI Score: Not  documented Patient Global Assessment: Not documented; Provider Global Assessment: Not documented Swollen: Not documented; Tender: Not documented Joint Exam   Not documented   There is currently no information documented on the homunculus. Go to the Rheumatology activity and complete the homunculus joint exam.  Investigation: No additional findings.  Imaging: No results found.  Recent Labs: Lab Results  Component Value Date   WBC 4.7 11/01/2017   HGB 13.4 11/01/2017   PLT 242 11/01/2017   NA 141 11/01/2017   K 4.0 11/01/2017   CL 104 11/01/2017   CO2 27 11/01/2017   GLUCOSE 91 11/01/2017   BUN 16 11/01/2017   CREATININE 0.72 11/01/2017   BILITOT 0.9 11/01/2017   ALKPHOS 85 10/27/2016   AST 17 11/01/2017   ALT 14 11/01/2017   PROT 6.8 11/01/2017   ALBUMIN 4.1 10/27/2016   CALCIUM 9.6 11/01/2017   GFRAA 100 11/01/2017    Speciality Comments: No specialty comments available.  Procedures:  Large Joint Inj:  bilateral greater trochanter on 05/23/2018 1:22 PM Indications: pain Details: 27 G 1.5 in needle, lateral approach  Arthrogram: No  Medications (Right): 1.5 mL lidocaine 1 %; 40 mg triamcinolone acetonide 40 MG/ML Medications (Left): 1.5 mL lidocaine 1 %; 40 mg triamcinolone acetonide 40 MG/ML Outcome: tolerated well, no immediate complications Procedure, treatment alternatives, risks and benefits explained, specific risks discussed. Consent was given by the patient. Immediately prior to procedure a time out was called to verify the correct patient, procedure, equipment, support staff and site/side marked as required. Patient was prepped and draped in the usual sterile fashion.     Allergies: Divalproex sodium   Assessment / Plan:     Visit Diagnoses: Fibromyalgia: She continues to have generalized muscle aches and muscle tenderness due to fibromyalgia.  She takes methadone 5 mg 3 times daily which provide significant pain relief.  She has muscle tension muscle  tenderness in the trapezius muscles bilaterally.  She presents today with bilateral trochanteric bursitis.  She requested bilateral cortisone injections.  She tolerated procedure well.  She has been going to exercise classes 3 days a week which has been helping with muscle strengthening and her level of fatigue.  She was encouraged to continue to stay active and exercise on a regular basis.  History of insomnia: She continues to have interrupted sleep at night.  Primary osteoarthritis of both hands: She has DIP synovial thickening consistent with osteoarthritis.  She has a mucin cyst on the right first DIP joint.  Joint protection and muscle strengthening were discussed.  Primary osteoarthritis of both knees: No warmth or effusion noted.  She is no discomfort at this time.  Trochanteric bursitis of both hips: She has tenderness of bilateral trochanteric bursa.  She has been having bursitis for the past 4 to 5 months.  She has been trying to stay active and exercise on a regular basis.  She has been performing stretching exercises.  She requested bilateral trochanteric bursitis cortisone injections today in the office.  She tolerated the procedure well.  Potential side effects were discussed.  She was given a handout of exercises that she can perform at home.  Pain in both feet -she has been having increased discomfort in bilateral feet.  She has tenderness on the dorsal aspect of both feet.  No swelling was noted.  X-rays of both feet were obtained today.  We discussed the importance of wearing proper fitting shoes.  Plan: XR Foot 2 Views Left, XR Foot 2 Views Right   Vitamin D deficiency: She has been taking vitamin D 50,000 units by mouth twice a month.  We will recheck vitamin D level today.  Plan: VITAMIN D 25 Hydroxy (Vit-D Deficiency, Fractures)  History of fatigue: Chronic and stable.   Other medical conditions are listed as follows:  History of sleep apnea  History of migraine  History  of depression    Orders: Orders Placed This Encounter  Procedures  . Large Joint Inj  . XR Foot 2 Views Left  . XR Foot 2 Views Right  . VITAMIN D 25 Hydroxy (Vit-D Deficiency, Fractures)   No orders of the defined types were placed in this encounter.   Face-to-face time spent with patient was 30 minutes. Greater than 50% of time was spent in counseling and coordination of care.  Follow-Up Instructions: Return in about 6 months (around 11/21/2018) for Fibromyalgia, Osteoarthritis.   Ofilia Neas, PA-C  I examined and evaluated the patient with Kari Sams PA.  Patient  continues to have some discomfort from fibromyalgia.  She has been having increased pain in her feet.  The x-ray findings were consistent with osteoarthritis.  I do not see any synovitis on my examination today.  Proper fitting shoes were discussed.  She also had tenderness over bilateral trochanteric bursa.  Bilateral trochanter bursa was injected with cortisone as described above.  The plan of care was discussed as noted above.  Bo Merino, MD  Note - This record has been created using Editor, commissioning.  Chart creation errors have been sought, but may not always  have been located. Such creation errors do not reflect on  the standard of medical care.

## 2018-05-23 ENCOUNTER — Ambulatory Visit (INDEPENDENT_AMBULATORY_CARE_PROVIDER_SITE_OTHER): Payer: Medicare Other

## 2018-05-23 ENCOUNTER — Ambulatory Visit (INDEPENDENT_AMBULATORY_CARE_PROVIDER_SITE_OTHER): Payer: Self-pay

## 2018-05-23 ENCOUNTER — Ambulatory Visit (INDEPENDENT_AMBULATORY_CARE_PROVIDER_SITE_OTHER): Payer: Medicare Other | Admitting: Physician Assistant

## 2018-05-23 ENCOUNTER — Encounter: Payer: Self-pay | Admitting: Physician Assistant

## 2018-05-23 VITALS — BP 128/66 | HR 92 | Ht 65.25 in | Wt 177.5 lb

## 2018-05-23 DIAGNOSIS — M19042 Primary osteoarthritis, left hand: Secondary | ICD-10-CM

## 2018-05-23 DIAGNOSIS — Z8639 Personal history of other endocrine, nutritional and metabolic disease: Secondary | ICD-10-CM

## 2018-05-23 DIAGNOSIS — Z8659 Personal history of other mental and behavioral disorders: Secondary | ICD-10-CM | POA: Diagnosis not present

## 2018-05-23 DIAGNOSIS — M79671 Pain in right foot: Secondary | ICD-10-CM

## 2018-05-23 DIAGNOSIS — E559 Vitamin D deficiency, unspecified: Secondary | ICD-10-CM

## 2018-05-23 DIAGNOSIS — Z8669 Personal history of other diseases of the nervous system and sense organs: Secondary | ICD-10-CM | POA: Diagnosis not present

## 2018-05-23 DIAGNOSIS — M170 Bilateral primary osteoarthritis of knee: Secondary | ICD-10-CM | POA: Diagnosis not present

## 2018-05-23 DIAGNOSIS — M7061 Trochanteric bursitis, right hip: Secondary | ICD-10-CM

## 2018-05-23 DIAGNOSIS — Z87898 Personal history of other specified conditions: Secondary | ICD-10-CM | POA: Diagnosis not present

## 2018-05-23 DIAGNOSIS — M797 Fibromyalgia: Secondary | ICD-10-CM | POA: Diagnosis not present

## 2018-05-23 DIAGNOSIS — M7062 Trochanteric bursitis, left hip: Secondary | ICD-10-CM

## 2018-05-23 DIAGNOSIS — M79672 Pain in left foot: Secondary | ICD-10-CM | POA: Diagnosis not present

## 2018-05-23 DIAGNOSIS — M19041 Primary osteoarthritis, right hand: Secondary | ICD-10-CM

## 2018-05-23 MED ORDER — LIDOCAINE HCL 1 % IJ SOLN
1.5000 mL | INTRAMUSCULAR | Status: AC | PRN
  Administered 2018-05-23: 1.5 mL

## 2018-05-23 MED ORDER — TRIAMCINOLONE ACETONIDE 40 MG/ML IJ SUSP
40.0000 mg | INTRAMUSCULAR | Status: AC | PRN
  Administered 2018-05-23: 40 mg via INTRA_ARTICULAR

## 2018-05-23 NOTE — Patient Instructions (Signed)

## 2018-05-24 LAB — VITAMIN D 25 HYDROXY (VIT D DEFICIENCY, FRACTURES): Vit D, 25-Hydroxy: 43 ng/mL (ref 30–100)

## 2018-05-24 NOTE — Progress Notes (Signed)
Vitamin D is WNL. Please recommend a maintenance dose of vitamin D.

## 2018-05-28 DIAGNOSIS — M47812 Spondylosis without myelopathy or radiculopathy, cervical region: Secondary | ICD-10-CM | POA: Diagnosis not present

## 2018-05-28 DIAGNOSIS — Z79891 Long term (current) use of opiate analgesic: Secondary | ICD-10-CM | POA: Diagnosis not present

## 2018-05-28 DIAGNOSIS — G43109 Migraine with aura, not intractable, without status migrainosus: Secondary | ICD-10-CM | POA: Diagnosis not present

## 2018-05-28 DIAGNOSIS — G894 Chronic pain syndrome: Secondary | ICD-10-CM | POA: Diagnosis not present

## 2018-06-24 ENCOUNTER — Other Ambulatory Visit: Payer: Self-pay | Admitting: Rheumatology

## 2018-06-24 NOTE — Telephone Encounter (Signed)
Last Visit: 05/23/18 Next Visit: 11/19/18   Okay to refill per Dr. Estanislado Pandy

## 2018-07-05 DIAGNOSIS — Z23 Encounter for immunization: Secondary | ICD-10-CM | POA: Diagnosis not present

## 2018-07-29 DIAGNOSIS — M47812 Spondylosis without myelopathy or radiculopathy, cervical region: Secondary | ICD-10-CM | POA: Diagnosis not present

## 2018-07-29 DIAGNOSIS — Z79891 Long term (current) use of opiate analgesic: Secondary | ICD-10-CM | POA: Diagnosis not present

## 2018-07-29 DIAGNOSIS — G894 Chronic pain syndrome: Secondary | ICD-10-CM | POA: Diagnosis not present

## 2018-07-29 DIAGNOSIS — G43109 Migraine with aura, not intractable, without status migrainosus: Secondary | ICD-10-CM | POA: Diagnosis not present

## 2018-08-15 ENCOUNTER — Ambulatory Visit (INDEPENDENT_AMBULATORY_CARE_PROVIDER_SITE_OTHER): Payer: Medicare Other | Admitting: Internal Medicine

## 2018-08-15 ENCOUNTER — Encounter: Payer: Self-pay | Admitting: Internal Medicine

## 2018-08-15 VITALS — BP 130/88 | HR 71 | Ht 66.0 in | Wt 173.0 lb

## 2018-08-15 DIAGNOSIS — G4733 Obstructive sleep apnea (adult) (pediatric): Secondary | ICD-10-CM | POA: Diagnosis not present

## 2018-08-15 DIAGNOSIS — M797 Fibromyalgia: Secondary | ICD-10-CM

## 2018-08-15 MED ORDER — MODAFINIL 200 MG PO TABS: ORAL_TABLET | ORAL | 5 refills | Status: DC

## 2018-08-15 NOTE — Progress Notes (Signed)
Patient ID: Kari Flynn, female    DOB: Oct 22, 1950, 67 y.o.   MRN: [id number]  HPI F former smoker, followed for OSA, Insomnia, complicated by hx of fibromyalgia and migraine.  NPSG 01/10/11-AHI 27.7/hour, desaturation to 86% ------------------------------------------------------------  08/15/17- 67 year old female former smoker followed for OSA, insomnia, complicated by history fibromyalgia, migraine CPAP 12/[facility]> today increase to 14 OSA DME: [facility]- Pt wears CPAP nightly. DL attached. No new supplies needed at this.  Provigil continues to be a significant help in managing her fibromyalgia. Download 93% compliance, AHI 7.0/0. But admits she is having trouble waking up and getting started in the mornings.  We discussed trying a pressure increase.  08/15/2018- 67 year old female former smoker followed for OSA, insomnia, complicated by history fibromyalgia, migraine CPAP 12/[facility] -----She is doing well with her cpap at this time.  Continues to use Provigil with help in managing fibromyalgia Download 97% compliance AHI 5.1/hour He denies any problems with CPAP "it saved my life".  Reviewed her download. Provigil helps with the fatigue aspect of her fibromyalgia, usually taken as one half tab each morning.                     ROS-see HPI + = positive Constitutional:   No-   weight loss, night sweats, fevers, chills, +fatigue, lassitude. HEENT:   No-  headaches, difficulty swallowing, tooth/dental problems, sore throat,       No-  sneezing, itching, ear ache, nasal congestion, post nasal drip,  CV:  No-   chest pain, orthopnea, PND, swelling in lower extremities, anasarca,  dizziness, palpitations Resp: No- acute  shortness of breath with exertion or at rest.              No-   productive cough,  No non-productive cough,  No- coughing up of blood.              No-   change in color of mucus.  No- wheezing.   Skin: No-   rash or lesions. GI:  No-    heartburn, indigestion, abdominal pain, nausea, vomiting,  GU:  MS:  No-   joint pain or swelling.   Neuro-     nothing unusual Psych:  No- change in mood or affect. No depression or anxiety.  No memory loss.   Objective:   Physical Exam General- Alert, Oriented, Affect-normal, Distress- none acute; medium build Skin- rash-none, lesions- none, excoriation- none Lymphadenopathy- none Head- atraumatic            Eyes- Gross vision intact, PERRLA, conjunctivae clear secretions; periorbital edema+ mild            Ears- Hearing, canals-normal            Nose- Clear, no-Septal dev, mucus, polyps, erosion, perforation             Throat- Mallampati III-IV , mucosa clear , drainage- none, tonsils- atrophic Neck- flexible , trachea midline, no stridor , thyroid nl, carotid no bruit Chest - symmetrical excursion , unlabored           Heart/CV- RRR , no murmur , no gallop  , no rub, nl s1 s2                           - JVD- none , edema- none, stasis changes- none, varices- none           Lung- clear  to P&A, wheeze- none, cough- none , dullness-none, rub- none           Chest wall-  Abd-  Br/ Gen/ Rectal- Not done, not indicated Extrem- cyanosis- none, clubbing, none, atrophy- none, strength- nl Neuro- grossly intact to observation

## 2018-08-15 NOTE — Patient Instructions (Signed)
Provigil refill sent electronically  We can continue CPAP 12, mask of choice humidifier, supplies, [employer]  Please call if we can help

## 2018-08-28 NOTE — Assessment & Plan Note (Signed)
She continues to benefit from CPAP with good compliance and control documented.  Sleep is improved. Plan-continue CPAP 12

## 2018-08-28 NOTE — Assessment & Plan Note (Signed)
Daytime fatigue, either residual after treatment for OSA, or related to her fibromyalgia which is treated elsewhere.  Provigil does help and is well-tolerated. Plan-refill Provigil, emphasize importance of getting enough sleep including naps.

## 2018-09-30 DIAGNOSIS — G43109 Migraine with aura, not intractable, without status migrainosus: Secondary | ICD-10-CM | POA: Diagnosis not present

## 2018-09-30 DIAGNOSIS — Z79891 Long term (current) use of opiate analgesic: Secondary | ICD-10-CM | POA: Diagnosis not present

## 2018-09-30 DIAGNOSIS — M47812 Spondylosis without myelopathy or radiculopathy, cervical region: Secondary | ICD-10-CM | POA: Diagnosis not present

## 2018-09-30 DIAGNOSIS — G894 Chronic pain syndrome: Secondary | ICD-10-CM | POA: Diagnosis not present

## 2018-11-05 NOTE — Progress Notes (Signed)
Office Visit Note  Patient: Kari Flynn             Date of Birth: 08/06/1951           MRN: [id number]             PCP: Elby Showers, MD Referring: Elby Showers, MD Visit Date: 11/19/2018 Occupation: _0 @  Subjective:  Myalgias   History of Present Illness: Kari Flynn is a 68 y.o. female with history of fibromyalgia and osteoarthritis.  She presents today with increased muscle aches and muscle tenderness.  She has noticed increased morning stiffness as well.  She reports she has been having worsening fibromyalgia symptoms due to not being able to go to her normal exercise classes.  She has been walking for exercise, but reports she has been waking up in increased discomfort.  She denies any knee joint pain or joint swelling.  She continues to have bilateral trochanteric bursitis.  She has been performing stretching exercises.  She states she has been taking 2 aleve per day for pain relief.  She has noticed more bruising since starting to take Aleve regularly. She continues to have chronic fatigue that has been worsening since not being able to do her normal exercise routine.  She has been sleeping well at night.  She takes melatonin 3 mg po at bedtime PRN for insomnia.    Activities of Daily Living:  Patient reports morning stiffness for several  hours.   Patient Reports nocturnal pain.  Difficulty dressing/grooming: Denies Difficulty climbing stairs: Denies Difficulty getting out of chair: Denies Difficulty using hands for taps, buttons, cutlery, and/or writing: Denies  Review of Systems  Constitutional: Positive for fatigue.  HENT: Negative for mouth sores, mouth dryness and nose dryness.   Eyes: Negative for pain, itching, visual disturbance and dryness.  Respiratory: Negative for cough, hemoptysis, shortness of breath, wheezing and difficulty breathing.   Cardiovascular: Negative for chest pain, palpitations, hypertension and swelling in legs/feet.    Gastrointestinal: Negative for abdominal pain, blood in stool, constipation, diarrhea, nausea and vomiting.  Endocrine: Negative for increased urination.  Genitourinary: Negative for painful urination and pelvic pain.  Musculoskeletal: Positive for arthralgias, joint pain and morning stiffness. Negative for joint swelling, myalgias, muscle weakness, muscle tenderness and myalgias.  Skin: Negative for color change, pallor, rash, hair loss, nodules/bumps, redness, skin tightness, ulcers and sensitivity to sunlight.  Allergic/Immunologic: Negative for susceptible to infections.  Neurological: Positive for headaches. Negative for dizziness, light-headedness, numbness and memory loss.  Hematological: Negative for swollen glands.  Psychiatric/Behavioral: Negative for depressed mood, confusion and sleep disturbance. The patient is not nervous/anxious.     PMFS History:  Patient Active Problem List   Diagnosis Date Noted  . Cervical spondylosis without myelopathy 01/12/2016  . Primary osteoarthritis of first carpometacarpal joint of left hand 01/12/2016  . Osteoarthritis of both knees 03/22/2014  . Unspecified constipation 10/16/2013  . Insomnia 06/23/2010  . Obstructive sleep apnea 06/25/2008  . MIGRAINE HEADACHE 06/25/2008  . Fibromyalgia 06/25/2008  . Shortness of breath 06/25/2008    Past Medical History:  Diagnosis Date  . Arthritis   . Dyspnea   . Lump of breast, left 11/08/2016   scheduled for a diagnostic/US mammo bilateral and stereo, US aspiration and ductogram.   . Migraine, unspecified, without mention of intractable migraine without mention of status migrainosus   . Myalgia and myositis, unspecified   . Obstructive sleep apnea (adult) (pediatric)     Family History  Problem Relation Age of Onset  . Hypertension Mother   . Arthritis Mother   . Hyperlipidemia Mother   . Melanoma Mother   . Diverticulitis Mother   . Multiple myeloma Father   . Arthritis Other         sibling  . Sarcoidosis Other        sibling-also has NIDDM  . Diabetes Brother   . Sarcoidosis Brother   . Diabetes Paternal Grandfather   . Colon cancer Neg Hx   . Esophageal cancer Neg Hx   . Rectal cancer Neg Hx   . Stomach cancer Neg Hx    Past Surgical History:  Procedure Laterality Date  . BLEPHAROPLASTY    . BREAST BIOPSY     B9  . BUNIONECTOMY     x2  . WISDOM TOOTH EXTRACTION     Social History   Social History Narrative  . Not on file   Immunization History  Administered Date(s) Administered  . Influenza Split 06/09/2011, 08/01/2012, 05/28/2017  . Influenza Whole 06/24/2009, 06/07/2010  . Influenza, High Dose Seasonal PF 07/13/2014, 07/06/2015, 05/31/2017, 07/05/2018  . Influenza,inj,Quad PF,6+ Mos 08/01/2013  . Influenza-Unspecified 07/25/2014, 07/06/2015, 06/06/2016  . Pneumococcal Conjugate-13 11/05/2014  . Pneumococcal Polysaccharide-23 11/05/2017  . Tdap 06/09/2011     Objective: Vital Signs: BP 124/69 (BP Location: Left Arm, Patient Position: Sitting, Cuff Size: Normal)   Pulse 71   Resp 12   Ht '5\' 6"'$  (1.676 m)   Wt 179 lb (81.2 kg)   BMI 28.89 kg/m    Physical Exam Vitals signs and nursing note reviewed.  Constitutional:      Appearance: She is well-developed.  HENT:     Head: Normocephalic and atraumatic.  Eyes:     Conjunctiva/sclera: Conjunctivae normal.  Neck:     Musculoskeletal: Normal range of motion.  Cardiovascular:     Rate and Rhythm: Normal rate and regular rhythm.     Heart sounds: Normal heart sounds.  Pulmonary:     Effort: Pulmonary effort is normal.     Breath sounds: Normal breath sounds.  Abdominal:     General: Bowel sounds are normal.     Palpations: Abdomen is soft.  Lymphadenopathy:     Cervical: No cervical adenopathy.  Skin:    General: Skin is warm and dry.     Capillary Refill: Capillary refill takes less than 2 seconds.  Neurological:     Mental Status: She is alert and oriented to person, place, and  time.  Psychiatric:        Behavior: Behavior normal.      Musculoskeletal Exam: C-spine, thoracic spine, and lumbar spine good ROM.  No midline spinal tenderness.  No SI joint tenderness.  Shoulder joints, elbow joints, wrist joints, MCPs, PIPs, and DIPs good ROM with no synovitis. PIP and DIP synovial thickening consistent with osteoarthritis.  Complete fist formation.  Hip joints, knee joints, ankle joints, MTPs, PIPs, and DIPs good ROM with no synovitis.  No warmth or effusion of knee joints.  No tenderness or swelling of ankle joints.  Tenderness over bilateral trochanteric bursa.    CDAI Exam: CDAI Score: Not documented Patient Global Assessment: Not documented; Provider Global Assessment: Not documented Swollen: Not documented; Tender: Not documented Joint Exam   Not documented   There is currently no information documented on the homunculus. Go to the Rheumatology activity and complete the homunculus joint exam.  Investigation: No additional findings.  Imaging: No results found.  Recent Labs: Lab  Results  Component Value Date   WBC 3.3 (L) 11/07/2018   HGB 13.9 11/07/2018   PLT 227 11/07/2018   NA 143 11/07/2018   K 4.4 11/07/2018   CL 105 11/07/2018   CO2 29 11/07/2018   GLUCOSE 86 11/07/2018   BUN 26 (H) 11/07/2018   CREATININE 0.73 11/07/2018   BILITOT 0.5 11/07/2018   ALKPHOS 85 10/27/2016   AST 22 11/07/2018   ALT 17 11/07/2018   PROT 6.5 11/07/2018   ALBUMIN 4.1 10/27/2016   CALCIUM 9.8 11/07/2018   GFRAA 97 11/07/2018    Speciality Comments: No specialty comments available.  Procedures:  No procedures performed Allergies: Divalproex sodium and Valproic acid   Assessment / Plan:     Visit Diagnoses: Fibromyalgia -She has generalized hyperalgesia and positive tender points.  She has generalized muscle aches and muscle tenderness.  She has been having increased myalgias.  She has been unable to go to her regular exercise class due to the pandemic.   She has been trying to perform exercise and stretching at home.  She has been walking for exercise. We discussed splitting up the increments that she exercises to prevent overexerting. She has bilateral trochanteric bursitis.  She declined cortisone injections today.  She was advised to continue performing stretching exercises.  She uses voltaren gel topically, takes 2 tablets of Aleve daily, and is taking methadone 5 mg 3 times daily for pain relief.  She has chronic fatigue which has been worsening due not being as active.  She has been sleeping well at night.  She takes melatonin 3 mg po at bedtime prn for insomnia.  The need for regular exercise and good sleep hygiene was discussed.  She will follow up in 3 months.  History of insomnia: She has been sleeping well at night.  She is taking melatonin 3 mg po at bedtime PRN for insomnia.   History of fatigue: She has chronic fatigue that has been worsening.    Primary osteoarthritis of both hands: PIP and DIP synovial thickening consistent with osteoarthritis.  She has complete fist formation.  She has no tenderness or synovitis.  Joint protection and muscle strengthening were discussed.   Primary osteoarthritis of both knees: No warmth or effusion of knee joints.  She has good ROM of both knee joints.  She has bilateral knee joint crepitus.    Trochanteric bursitis of both hips: She has tenderness over bilateral trochanteric bursa.  She was encouraged to perform stretching exercises on a regular basis.  She declined a cortisone injection today.   Vitamin D deficiency: She takes a calcium and vitamin D supplement.   History of sleep apnea  History of migraine  History of depression   Orders: No orders of the defined types were placed in this encounter.  No orders of the defined types were placed in this encounter.    Follow-Up Instructions: Return in about 6 months (around 05/22/2019) for Fibromyalgia, Osteoarthritis.   Ofilia Neas,  PA-C  Note - This record has been created using Dragon software.  Chart creation errors have been sought, but may not always  have been located. Such creation errors do not reflect on  the standard of medical care.

## 2018-11-07 ENCOUNTER — Other Ambulatory Visit: Payer: Self-pay

## 2018-11-07 ENCOUNTER — Other Ambulatory Visit: Payer: Medicare Other | Admitting: Internal Medicine

## 2018-11-07 DIAGNOSIS — Z13220 Encounter for screening for lipoid disorders: Secondary | ICD-10-CM | POA: Diagnosis not present

## 2018-11-07 DIAGNOSIS — G4733 Obstructive sleep apnea (adult) (pediatric): Secondary | ICD-10-CM

## 2018-11-07 DIAGNOSIS — M170 Bilateral primary osteoarthritis of knee: Secondary | ICD-10-CM | POA: Diagnosis not present

## 2018-11-07 DIAGNOSIS — K5903 Drug induced constipation: Secondary | ICD-10-CM

## 2018-11-07 DIAGNOSIS — M797 Fibromyalgia: Secondary | ICD-10-CM | POA: Diagnosis not present

## 2018-11-07 DIAGNOSIS — Z0000 Encounter for general adult medical examination without abnormal findings: Secondary | ICD-10-CM

## 2018-11-07 DIAGNOSIS — T402X5A Adverse effect of other opioids, initial encounter: Secondary | ICD-10-CM | POA: Diagnosis not present

## 2018-11-07 LAB — CBC WITH DIFFERENTIAL/PLATELET
ABSOLUTE MONOCYTES: 366 {cells}/uL (ref 200–950)
BASOS ABS: 20 {cells}/uL (ref 0–200)
Basophils Relative: 0.6 %
EOS PCT: 1.2 %
Eosinophils Absolute: 40 cells/uL (ref 15–500)
HCT: 40.9 % (ref 35.0–45.0)
Hemoglobin: 13.9 g/dL (ref 11.7–15.5)
LYMPHS ABS: 1148 {cells}/uL (ref 850–3900)
MCH: 30.4 pg (ref 27.0–33.0)
MCHC: 34 g/dL (ref 32.0–36.0)
MCV: 89.5 fL (ref 80.0–100.0)
MONOS PCT: 11.1 %
MPV: 10.4 fL (ref 7.5–12.5)
Neutro Abs: 1726 cells/uL (ref 1500–7800)
Neutrophils Relative %: 52.3 %
PLATELETS: 227 10*3/uL (ref 140–400)
RBC: 4.57 10*6/uL (ref 3.80–5.10)
RDW: 12.3 % (ref 11.0–15.0)
TOTAL LYMPHOCYTE: 34.8 %
WBC: 3.3 10*3/uL — ABNORMAL LOW (ref 3.8–10.8)

## 2018-11-07 LAB — COMPLETE METABOLIC PANEL WITH GFR
AG Ratio: 1.8 (calc) (ref 1.0–2.5)
ALKALINE PHOSPHATASE (APISO): 81 U/L (ref 37–153)
ALT: 17 U/L (ref 6–29)
AST: 22 U/L (ref 10–35)
Albumin: 4.2 g/dL (ref 3.6–5.1)
BUN / CREAT RATIO: 36 (calc) — AB (ref 6–22)
BUN: 26 mg/dL — AB (ref 7–25)
CHLORIDE: 105 mmol/L (ref 98–110)
CO2: 29 mmol/L (ref 20–32)
Calcium: 9.8 mg/dL (ref 8.6–10.4)
Creat: 0.73 mg/dL (ref 0.50–0.99)
GFR, Est African American: 97 mL/min/{1.73_m2} (ref >=60)
GFR, Est Non African American: 84 mL/min/{1.73_m2} (ref >=60)
GLUCOSE: 86 mg/dL (ref 65–99)
Globulin: 2.3 g/dL (calc) (ref 1.9–3.7)
Potassium: 4.4 mmol/L (ref 3.5–5.3)
Sodium: 143 mmol/L (ref 135–146)
Total Bilirubin: 0.5 mg/dL (ref 0.2–1.2)
Total Protein: 6.5 g/dL (ref 6.1–8.1)

## 2018-11-07 LAB — LIPID PANEL
CHOLESTEROL: 188 mg/dL (ref <200)
HDL: 81 mg/dL (ref >=50)
LDL CHOLESTEROL (CALC): 93 mg/dL
Non-HDL Cholesterol (Calc): 107 mg/dL (calc) (ref <130)
TRIGLYCERIDES: 62 mg/dL (ref <150)
Total CHOL/HDL Ratio: 2.3 (calc) (ref <5.0)

## 2018-11-07 LAB — TSH: TSH: 4.08 mIU/L (ref 0.40–4.50)

## 2018-11-12 ENCOUNTER — Encounter: Payer: Self-pay | Admitting: Internal Medicine

## 2018-11-12 ENCOUNTER — Ambulatory Visit (INDEPENDENT_AMBULATORY_CARE_PROVIDER_SITE_OTHER): Payer: Medicare Other | Admitting: Internal Medicine

## 2018-11-12 ENCOUNTER — Other Ambulatory Visit: Payer: Self-pay

## 2018-11-12 VITALS — BP 130/90 | HR 66 | Temp 98.6°F | Ht 66.0 in | Wt 174.0 lb

## 2018-11-12 DIAGNOSIS — K5903 Drug induced constipation: Secondary | ICD-10-CM | POA: Diagnosis not present

## 2018-11-12 DIAGNOSIS — G4733 Obstructive sleep apnea (adult) (pediatric): Secondary | ICD-10-CM | POA: Diagnosis not present

## 2018-11-12 DIAGNOSIS — M797 Fibromyalgia: Secondary | ICD-10-CM | POA: Diagnosis not present

## 2018-11-12 DIAGNOSIS — Z0000 Encounter for general adult medical examination without abnormal findings: Secondary | ICD-10-CM

## 2018-11-12 DIAGNOSIS — Z8669 Personal history of other diseases of the nervous system and sense organs: Secondary | ICD-10-CM

## 2018-11-12 DIAGNOSIS — T402X5A Adverse effect of other opioids, initial encounter: Secondary | ICD-10-CM

## 2018-11-12 DIAGNOSIS — M170 Bilateral primary osteoarthritis of knee: Secondary | ICD-10-CM

## 2018-11-12 MED ORDER — HYOSCYAMINE SULFATE 0.125 MG SL SUBL: 0.1250 mg | SUBLINGUAL_TABLET | SUBLINGUAL | 2 refills | Status: DC | PRN

## 2018-11-19 ENCOUNTER — Encounter: Payer: Self-pay | Admitting: Physician Assistant

## 2018-11-19 ENCOUNTER — Other Ambulatory Visit: Payer: Self-pay

## 2018-11-19 ENCOUNTER — Ambulatory Visit (INDEPENDENT_AMBULATORY_CARE_PROVIDER_SITE_OTHER): Payer: Medicare Other | Admitting: Physician Assistant

## 2018-11-19 VITALS — BP 124/69 | HR 71 | Resp 12 | Ht 66.0 in | Wt 179.0 lb

## 2018-11-19 DIAGNOSIS — M170 Bilateral primary osteoarthritis of knee: Secondary | ICD-10-CM

## 2018-11-19 DIAGNOSIS — M797 Fibromyalgia: Secondary | ICD-10-CM | POA: Diagnosis not present

## 2018-11-19 DIAGNOSIS — Z8669 Personal history of other diseases of the nervous system and sense organs: Secondary | ICD-10-CM | POA: Diagnosis not present

## 2018-11-19 DIAGNOSIS — M7062 Trochanteric bursitis, left hip: Secondary | ICD-10-CM

## 2018-11-19 DIAGNOSIS — M19041 Primary osteoarthritis, right hand: Secondary | ICD-10-CM

## 2018-11-19 DIAGNOSIS — M7061 Trochanteric bursitis, right hip: Secondary | ICD-10-CM | POA: Diagnosis not present

## 2018-11-19 DIAGNOSIS — Z8659 Personal history of other mental and behavioral disorders: Secondary | ICD-10-CM

## 2018-11-19 DIAGNOSIS — E559 Vitamin D deficiency, unspecified: Secondary | ICD-10-CM

## 2018-11-19 DIAGNOSIS — Z87898 Personal history of other specified conditions: Secondary | ICD-10-CM | POA: Diagnosis not present

## 2018-11-19 DIAGNOSIS — M19042 Primary osteoarthritis, left hand: Secondary | ICD-10-CM | POA: Diagnosis not present

## 2018-11-21 ENCOUNTER — Telehealth: Payer: Self-pay | Admitting: Internal Medicine

## 2018-11-21 NOTE — Telephone Encounter (Signed)
Medication name and strength: Modafinil (Provigil) 200mg  tab Provider: [facility] Pharmacy: [facility] Patient insurance ID: [id number]  Was the PA started on CMM?  Today 11/21/18 If yes, please enter the Key: [id number] - PA Case ID: [id number] - Rx #: [id number]  Timeframe for approval/denial: 5-7 business days for determination   Routing message to Kari Flynn's box to f/u on PA.

## 2018-11-22 ENCOUNTER — Telehealth: Payer: Self-pay

## 2018-11-22 NOTE — Telephone Encounter (Addendum)
Received fax on 11/22/2018 for PA ref #[id number] stating Modafinil tab 200MG , use as directed, is approved through 05/24/2019 under pt's Medicare Part D benefit. Called patient to notify of this. She verbalized understanding with no additional questions. She states the medication was just refilled recently. Nothing further needed at this time.

## 2018-11-22 NOTE — Telephone Encounter (Signed)
Labs from 11/07/2018 were reviewed by Hazel Sams, PA-C.  WBC low, according to patient her PCP is rechecking CBC.

## 2018-11-25 NOTE — Progress Notes (Signed)
Subjective:    Patient ID: Kari Flynn, female    DOB: 26-May-1951, 68 y.o.   MRN: [id number]  HPI Pleasant 68 year old White Female in today for health maintenance exam, Medicare wellness exam and evaluation of medical issues.  She receives disability benefits for history of migraine headaches and fibromyalgia syndrome.  She has had significant issues with constipation aggravated by her pain medication.  Fibromyalgia knee arthritis followed by Dr. Estanislado Pandy, Rheumatologist.  Has knee injections on an as-needed basis.  History of sleep apnea and uses CPAP device.  She is on Provigil, Flexeril, methadone, Robaxin and as needed Frova.  Dr. Olevia Perches did colonoscopy Oct 02, 2013 for right lower quadrant abdominal pain which was normal.  Infected laceration right leg during the summer 2015.  Past medical history: D&C 1979, bilateral tubal ligation 1987, bunionectomy 1990, benign right breast biopsy February 1995.  Family history: Brother had MI with history of diabetes now has chronic kidney disease after MI.  Mother died in 10-02-2013 of congestive heart failure.  Mother had multiple myeloma hypertension and gout.  Social history: Married.  2 adult sons.  Does not smoke.  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    Review of Systems  Respiratory: Negative.   Cardiovascular: Negative.   Gastrointestinal:       Patient secondary to pain meds  Musculoskeletal:       Chronic musculoskeletal pain  Neurological:       Occasional dizziness and lightheadedness.Marland Kitchen  History of migraine headaches       Objective:   Physical Exam Vitals signs reviewed.  Constitutional:      General: She is not in acute distress.    Appearance: Normal appearance.  HENT:     Head: Normocephalic and atraumatic.     Right Ear: Tympanic membrane normal.     Left Ear: Tympanic membrane normal.     Nose: Nose normal.     Mouth/Throat:     Mouth: Mucous membranes are moist.     Pharynx:  Oropharynx is clear.  Eyes:     General: No scleral icterus.       Right eye: No discharge.        Left eye: No discharge.     Conjunctiva/sclera: Conjunctivae normal.     Pupils: Pupils are equal, round, and reactive to light.  Neck:     Musculoskeletal: Neck supple. No neck rigidity.     Vascular: No carotid bruit.     Comments: No thyromegaly Cardiovascular:     Rate and Rhythm: Normal rate and regular rhythm.     Pulses: Normal pulses.     Heart sounds: Normal heart sounds. No murmur.  Pulmonary:     Effort: No respiratory distress.     Breath sounds: Normal breath sounds. No wheezing or rales.  Abdominal:     General: Bowel sounds are normal. There is no distension.     Palpations: Abdomen is soft. There is no mass.     Tenderness: There is no abdominal tenderness. There is no guarding.  Genitourinary:    Comments: Pap done 10-02-16.  Does not need to be repeated due to age.  Bimanual normal. Musculoskeletal:     Right lower leg: No edema.     Left lower leg: No edema.  Lymphadenopathy:     Cervical: No cervical adenopathy.  Skin:    General: Skin is warm and dry.     Findings: No rash.  Neurological:  General: No focal deficit present.     Mental Status: She is alert and oriented to person, place, and time.     Cranial Nerves: No cranial nerve deficit.     Motor: No weakness.     Coordination: Coordination normal.  Psychiatric:        Mood and Affect: Mood normal.        Behavior: Behavior normal.        Thought Content: Thought content normal.        Judgment: Judgment normal.           Assessment & Plan:  Fibromyalgia syndrome treated by rheumatologist  Migraine headaches-treated by chronic pain management who also treats chronic pain with fibromyalgia  Primary osteoarthritis of both knees-treated by rheumatologist  Opioid-induced constipation  History of benign positional vertigo treated with Antivert  Plan: Continue current medications follow-up  in 1 year or as needed.  Subjective:   Patient presents for Medicare Annual/Subsequent preventive examination.  Review Past Medical/Family/Social: See above   Risk Factors  Current exercise habits: Tries to get some exercise as tolerated with medical issues Dietary issues discussed: Low-fat low carbohydrate  Cardiac risk factors: Family history  Depression Screen  (Note: if answer to either of the following is "Yes", a more complete depression screening is indicated)   Over the past two weeks, have you felt down, depressed or hopeless? No  Over the past two weeks, have you felt little interest or pleasure in doing things? No Have you lost interest or pleasure in daily life? No Do you often feel hopeless? No Do you cry easily over simple problems? No   Activities of Daily Living  In your present state of health, do you have any difficulty performing the following activities?:   Driving? No  Managing money? No  Feeding yourself? No  Getting from bed to chair? No  Climbing a flight of stairs? No  Preparing food and eating?: No  Bathing or showering? No  Getting dressed: No  Getting to the toilet? No  Using the toilet:No  Moving around from place to place: No  In the past year have you fallen or had a near fall?:No  Are you sexually active? No  Do you have more than one partner? No   Hearing Difficulties: No  Do you often ask people to speak up or repeat themselves? No  Do you experience ringing or noises in your ears? No  Do you have difficulty understanding soft or whispered voices? No  Do you feel that you have a problem with memory? No Do you often misplace items?  Occasionally   Home Safety:  Do you have a smoke alarm at your residence? Yes Do you have grab bars in the bathroom?  None Do you have throw rugs in your house?  Yes   Cognitive Testing  Alert? Yes Normal Appearance?Yes  Oriented to person? Yes Place? Yes  Time? Yes  Recall of three objects? Yes   Can perform simple calculations? Yes  Displays appropriate judgment?Yes  Can read the correct time from a watch face?Yes   List the Names of Other Physician/Practitioners you currently use:  See referral list for the physicians patient is currently seeing.  Rheumatologist  Chronic pain management physician   Review of Systems: See above   Objective:     General appearance: Appears stated age and mildly obese  Head: Normocephalic, without obvious abnormality, atraumatic  Eyes: conj clear, EOMi PEERLA  Ears: normal TM's and external  ear canals both ears  Nose: Nares normal. Septum midline. Mucosa normal. No drainage or sinus tenderness.  Throat: lips, mucosa, and tongue normal; teeth and gums normal  Neck: no adenopathy, no carotid bruit, no JVD, supple, symmetrical, trachea midline and thyroid not enlarged, symmetric, no tenderness/mass/nodules  No CVA tenderness.  Lungs: clear to auscultation bilaterally  Breasts: normal appearance, no masses or tenderness Heart: regular rate and rhythm, S1, S2 normal, no murmur, click, rub or gallop  Abdomen: soft, non-tender; bowel sounds normal; no masses, no organomegaly  Musculoskeletal: ROM normal in all joints, no crepitus, no deformity, Normal muscle strengthen. Back  is symmetric, no curvature. Skin: Skin color, texture, turgor normal. No rashes or lesions  Lymph nodes: Cervical, supraclavicular, and axillary nodes normal.  Neurologic: CN 2 -12 Normal, Normal symmetric reflexes. Normal coordination and gait  Psych: Alert & Oriented x 3, Mood appear stable.    Assessment:    Annual wellness medicare exam   Plan:    During the course of the visit the patient was educated and counseled about appropriate screening and preventive services including:        Patient Instructions (the written plan) was given to the patient.  Medicare Attestation  I have personally reviewed:  The patient's medical and social history  Their use of  alcohol, tobacco or illicit drugs  Their current medications and supplements  The patient's functional ability including ADLs,fall risks, home safety risks, cognitive, and hearing and visual impairment  Diet and physical activities  Evidence for depression or mood disorders  The patient's weight, height, BMI, and visual acuity have been recorded in the chart. I have made referrals, counseling, and provided education to the patient based on review of the above and I have provided the patient with a written personalized care plan for preventive services.

## 2018-11-25 NOTE — Patient Instructions (Signed)
It was a pleasure to see you today.  Labs are within normal limits.  Return in 1 year or as needed.

## 2018-11-26 LAB — POCT URINALYSIS DIPSTICK
Appearance: NEGATIVE
Bilirubin, UA: NEGATIVE
Blood, UA: NEGATIVE
Glucose, UA: NEGATIVE
Ketones, UA: NEGATIVE
Leukocytes, UA: NEGATIVE
Nitrite, UA: NEGATIVE
ODOR: NEGATIVE
PH UA: 6 (ref 5.0–8.0)
Protein, UA: NEGATIVE
Spec Grav, UA: 1.015 (ref 1.010–1.025)
UROBILINOGEN UA: 0.2 U/dL

## 2018-11-27 DIAGNOSIS — Z79891 Long term (current) use of opiate analgesic: Secondary | ICD-10-CM | POA: Diagnosis not present

## 2018-11-27 DIAGNOSIS — G894 Chronic pain syndrome: Secondary | ICD-10-CM | POA: Diagnosis not present

## 2018-11-27 DIAGNOSIS — G43109 Migraine with aura, not intractable, without status migrainosus: Secondary | ICD-10-CM | POA: Diagnosis not present

## 2018-11-27 DIAGNOSIS — M47812 Spondylosis without myelopathy or radiculopathy, cervical region: Secondary | ICD-10-CM | POA: Diagnosis not present

## 2018-12-30 ENCOUNTER — Other Ambulatory Visit: Payer: Self-pay | Admitting: Rheumatology

## 2018-12-30 NOTE — Telephone Encounter (Signed)
Last Visit: 11/19/2018 Next Visit: 05/21/2019  Okay to refill per Dr. Estanislado Pandy.

## 2018-12-31 DIAGNOSIS — D3132 Benign neoplasm of left choroid: Secondary | ICD-10-CM | POA: Diagnosis not present

## 2018-12-31 DIAGNOSIS — H25013 Cortical age-related cataract, bilateral: Secondary | ICD-10-CM | POA: Diagnosis not present

## 2018-12-31 DIAGNOSIS — H2513 Age-related nuclear cataract, bilateral: Secondary | ICD-10-CM | POA: Diagnosis not present

## 2018-12-31 DIAGNOSIS — H5213 Myopia, bilateral: Secondary | ICD-10-CM | POA: Diagnosis not present

## 2019-01-16 ENCOUNTER — Other Ambulatory Visit: Payer: Self-pay | Admitting: Internal Medicine

## 2019-01-16 DIAGNOSIS — Z1231 Encounter for screening mammogram for malignant neoplasm of breast: Secondary | ICD-10-CM

## 2019-01-27 DIAGNOSIS — Z79891 Long term (current) use of opiate analgesic: Secondary | ICD-10-CM | POA: Diagnosis not present

## 2019-01-27 DIAGNOSIS — M47812 Spondylosis without myelopathy or radiculopathy, cervical region: Secondary | ICD-10-CM | POA: Diagnosis not present

## 2019-01-27 DIAGNOSIS — G894 Chronic pain syndrome: Secondary | ICD-10-CM | POA: Diagnosis not present

## 2019-01-27 DIAGNOSIS — G43109 Migraine with aura, not intractable, without status migrainosus: Secondary | ICD-10-CM | POA: Diagnosis not present

## 2019-02-13 ENCOUNTER — Other Ambulatory Visit: Payer: Medicare Other | Admitting: Internal Medicine

## 2019-02-13 ENCOUNTER — Other Ambulatory Visit: Payer: Self-pay

## 2019-02-13 DIAGNOSIS — R5383 Other fatigue: Secondary | ICD-10-CM | POA: Diagnosis not present

## 2019-02-13 LAB — CBC WITH DIFFERENTIAL/PLATELET
Absolute Monocytes: 416 cells/uL (ref 200–950)
Basophils Absolute: 32 cells/uL (ref 0–200)
Basophils Relative: 0.8 %
Eosinophils Absolute: 52 cells/uL (ref 15–500)
Eosinophils Relative: 1.3 %
HCT: 43.4 % (ref 35.0–45.0)
Hemoglobin: 14.5 g/dL (ref 11.7–15.5)
Lymphs Abs: 1328 cells/uL (ref 850–3900)
MCH: 30.7 pg (ref 27.0–33.0)
MCHC: 33.4 g/dL (ref 32.0–36.0)
MCV: 91.9 fL (ref 80.0–100.0)
MPV: 10.4 fL (ref 7.5–12.5)
Monocytes Relative: 10.4 %
Neutro Abs: 2172 cells/uL (ref 1500–7800)
Neutrophils Relative %: 54.3 %
Platelets: 224 10*3/uL (ref 140–400)
RBC: 4.72 10*6/uL (ref 3.80–5.10)
RDW: 12.9 % (ref 11.0–15.0)
Total Lymphocyte: 33.2 %
WBC: 4 10*3/uL (ref 3.8–10.8)

## 2019-02-13 LAB — TSH: TSH: 3.32 mIU/L (ref 0.40–4.50)

## 2019-03-06 ENCOUNTER — Ambulatory Visit
Admission: RE | Admit: 2019-03-06 | Discharge: 2019-03-06 | Disposition: A | Discharge status: Home / Self Care | Payer: Medicare Other | Source: Ambulatory Visit | Attending: Internal Medicine | Admitting: Internal Medicine

## 2019-03-06 DIAGNOSIS — Z1231 Encounter for screening mammogram for malignant neoplasm of breast: Secondary | ICD-10-CM

## 2019-03-17 ENCOUNTER — Other Ambulatory Visit: Payer: Self-pay | Admitting: Internal Medicine

## 2019-03-17 NOTE — Telephone Encounter (Signed)
Pt last seen 08/15/2018 by CY with recommendations to continue modafinil (Provigil). Modafinil 200 mg 1/2 or 1 tab daily as needed started 08/15/2018 30 tablets x5 refills.   CY, please advise if you authorize the refill of this medication. Thank you.

## 2019-03-17 NOTE — Telephone Encounter (Signed)
Modafinil refill e-sent 

## 2019-03-31 DIAGNOSIS — M47812 Spondylosis without myelopathy or radiculopathy, cervical region: Secondary | ICD-10-CM | POA: Diagnosis not present

## 2019-03-31 DIAGNOSIS — G894 Chronic pain syndrome: Secondary | ICD-10-CM | POA: Diagnosis not present

## 2019-03-31 DIAGNOSIS — Z79891 Long term (current) use of opiate analgesic: Secondary | ICD-10-CM | POA: Diagnosis not present

## 2019-03-31 DIAGNOSIS — G43109 Migraine with aura, not intractable, without status migrainosus: Secondary | ICD-10-CM | POA: Diagnosis not present

## 2019-05-07 NOTE — Progress Notes (Signed)
Office Visit Note  Patient: Kari Flynn             Date of Birth: 1951-03-14           MRN: [id number]             PCP: Elby Showers, MD Referring: Elby Showers, MD Visit Date: 05/21/2019 Occupation: _0 @  Subjective:  Trochanteric bursitis bilaterally   History of Present Illness: Kari Flynn is a 68 y.o. female with history of fibromyalgia and osteoarthritis. She presents today with trochanteric bursitis bilaterally.  She has been having difficulty walking due to the discomfort she experiences.  She plans on starting to walk for exercise once the bursitis comes down.  She is having chronic pain in both hands.  She states the pain is worse after performing yard work.  She also experiences stiffness in both hands but denies any joint swelling.  She states that she uses paraffin wax about once weekly which helps with the discomfort.  She denies any other joint pain or joint swelling at this time.  She reports that she has difficulty sleeping about 3 nights per week.  She states that she has chronic fatigue related to insomnia.  She states that overall her pain level is about a 6 out of 10.  She takes methadone 5 mg 3 times daily for pain relief  Activities of Daily Living:  Patient reports morning stiffness for 1.5 hours.   Patient Reports nocturnal pain.  Difficulty dressing/grooming: Denies Difficulty climbing stairs: Denies Difficulty getting out of chair: Denies Difficulty using hands for taps, buttons, cutlery, and/or writing: Denies  Review of Systems  Constitutional: Positive for fatigue.  HENT: Negative for mouth sores, mouth dryness and nose dryness.   Eyes: Negative for pain, visual disturbance and dryness.  Respiratory: Negative for cough, hemoptysis, shortness of breath and difficulty breathing.   Cardiovascular: Negative for chest pain, palpitations, hypertension and swelling in legs/feet.  Gastrointestinal: Negative for blood in stool, constipation and  diarrhea.  Endocrine: Negative for increased urination.  Genitourinary: Negative for painful urination.  Musculoskeletal: Positive for arthralgias, joint pain, myalgias, morning stiffness, muscle tenderness and myalgias. Negative for joint swelling and muscle weakness.  Skin: Negative for color change, pallor, rash, hair loss, nodules/bumps, skin tightness, ulcers and sensitivity to sunlight.  Allergic/Immunologic: Negative for susceptible to infections.  Neurological: Positive for headaches (Hx of migraines). Negative for dizziness, numbness and weakness.  Hematological: Negative for swollen glands.  Psychiatric/Behavioral: Positive for sleep disturbance. Negative for depressed mood. The patient is not nervous/anxious.     PMFS History:  Patient Active Problem List   Diagnosis Date Noted   Cervical spondylosis without myelopathy 01/12/2016   Primary osteoarthritis of first carpometacarpal joint of left hand 01/12/2016   Osteoarthritis of both knees 03/22/2014   Unspecified constipation 10/16/2013   Insomnia 06/23/2010   Obstructive sleep apnea 06/25/2008   MIGRAINE HEADACHE 06/25/2008   Fibromyalgia 06/25/2008   Shortness of breath 06/25/2008    Past Medical History:  Diagnosis Date   Arthritis    Dyspnea    Lump of breast, left 11/08/2016   scheduled for a diagnostic/US mammo bilateral and stereo, US aspiration and ductogram.    Migraine, unspecified, without mention of intractable migraine without mention of status migrainosus    Myalgia and myositis, unspecified    Obstructive sleep apnea (adult) (pediatric)     Family History  Problem Relation Age of Onset   Hypertension Mother    Arthritis Mother  Hyperlipidemia Mother    Melanoma Mother    Diverticulitis Mother    Multiple myeloma Father    Arthritis Other        sibling   Sarcoidosis Other        sibling-also has NIDDM   Diabetes Brother    Sarcoidosis Brother    Heart attack Brother     Chronic Renal Failure Brother    Diabetes Paternal Grandfather    Colon cancer Neg Hx    Esophageal cancer Neg Hx    Rectal cancer Neg Hx    Stomach cancer Neg Hx    Past Surgical History:  Procedure Laterality Date   BLEPHAROPLASTY     BREAST BIOPSY     B9   BUNIONECTOMY     x2   WISDOM TOOTH EXTRACTION     Social History   Social History Narrative   Not on file   Immunization History  Administered Date(s) Administered   Influenza Split 06/09/2011, 08/01/2012, 05/28/2017   Influenza Whole 06/24/2009, 06/07/2010   Influenza, High Dose Seasonal PF 07/13/2014, 07/06/2015, 05/31/2017, 07/05/2018   Influenza,inj,Quad PF,6+ Mos 08/01/2013   Influenza-Unspecified 07/25/2014, 07/06/2015, 06/06/2016   Pneumococcal Conjugate-13 11/05/2014   Pneumococcal Polysaccharide-23 11/05/2017   Tdap 06/09/2011     Objective: Vital Signs: BP 130/67 (BP Location: Right Arm, Patient Position: Sitting, Cuff Size: Large)    Pulse 76    Resp 12    Ht _0  (1.676 m)    Wt 180 lb 9.6 oz (81.9 kg)    BMI 29.15 kg/m    Physical Exam Vitals signs and nursing note reviewed.  Constitutional:      Appearance: She is well-developed.  HENT:     Head: Normocephalic and atraumatic.  Eyes:     Conjunctiva/sclera: Conjunctivae normal.  Neck:     Musculoskeletal: Normal range of motion.  Cardiovascular:     Rate and Rhythm: Normal rate and regular rhythm.     Heart sounds: Normal heart sounds.  Pulmonary:     Effort: Pulmonary effort is normal.     Breath sounds: Normal breath sounds.  Abdominal:     General: Bowel sounds are normal.     Palpations: Abdomen is soft.  Lymphadenopathy:     Cervical: No cervical adenopathy.  Skin:    General: Skin is warm and dry.     Capillary Refill: Capillary refill takes less than 2 seconds.  Neurological:     Mental Status: She is alert and oriented to person, place, and time.  Psychiatric:        Behavior: Behavior normal.       Musculoskeletal Exam: C-spine, thoracic spine, lumbar spine good range of motion.  No midline spinal tenderness.  No SI joint tenderness.  Shoulder joints, elbow joints, wrist joints, MCPs, PIPs, DIPs good range of motion no synovitis.  She has PIP and DIP synovial thickening consistent with osteoarthritis of both hands.  She has complete fist formation bilaterally.  Hip joints, knee joints, ankle joints, MTPs and PIPs and DIPs good range of motion no synovitis.  No warmth or effusion of bilateral knee joints.  She has tenderness of the right ankle joint but no warmth or effusion was noted.  She has tenderness over bilateral trochanter bursa.  CDAI Exam: CDAI Score: -- Patient Global: --; Provider Global: -- Swollen: --; Tender: -- Joint Exam   No joint exam has been documented for this visit   There is currently no information documented on the homunculus. Go  to the Rheumatology activity and complete the homunculus joint exam.  Investigation: No additional findings.  Imaging: No results found.  Recent Labs: Lab Results  Component Value Date   WBC 4.0 02/13/2019   HGB 14.5 02/13/2019   PLT 224 02/13/2019   NA 143 11/07/2018   K 4.4 11/07/2018   CL 105 11/07/2018   CO2 29 11/07/2018   GLUCOSE 86 11/07/2018   BUN 26 (H) 11/07/2018   CREATININE 0.73 11/07/2018   BILITOT 0.5 11/07/2018   ALKPHOS 85 10/27/2016   AST 22 11/07/2018   ALT 17 11/07/2018   PROT 6.5 11/07/2018   ALBUMIN 4.1 10/27/2016   CALCIUM 9.8 11/07/2018   GFRAA 97 11/07/2018    Speciality Comments: No specialty comments available.  Procedures:  Large Joint Inj: bilateral greater trochanter on 05/21/2019 1:28 PM Indications: pain Details: 27 G 1.5 in needle, lateral approach  Arthrogram: No  Medications (Right): 1.5 mL lidocaine 1 %; 40 mg triamcinolone acetonide 40 MG/ML Aspirate (Right): 0 mL Medications (Left): 1.5 mL lidocaine 1 %; 40 mg triamcinolone acetonide 40 MG/ML Aspirate (Left): 0  mL Outcome: tolerated well, no immediate complications Procedure, treatment alternatives, risks and benefits explained, specific risks discussed. Consent was given by the patient. Immediately prior to procedure a time out was called to verify the correct patient, procedure, equipment, support staff and site/side marked as required. Patient was prepped and draped in the usual sterile fashion.     Allergies: Divalproex sodium and Valproic acid   Assessment / Plan:     Visit Diagnoses: Fibromyalgia: She has generalized muscle aches and muscle tenderness due to fibromyalgia.  She has been more sedentary recently.  She is having trochanteric bursitis bilaterally, which has been making it difficult to walk for exercise.  She requested trochanteric bursa cortisone injections today.  She tolerated procedure well.  The procedure note was completed above.  She was encouraged to perform stretching exercises on a regular basis.  We will do cystoscopy discussed the importance of regular exercise.  She has difficulty sleeping at night several nights per week.  She has chronic fatigue related to insomnia.  Good sleep hygiene was discussed.  She continues to take methadone 5 mg 3 times daily for pain relief.  She will follow-up in the office in 6 months.  History of insomnia: She has difficulty falling asleep about 3 nights weekly.  We discussed good sleep hygiene.  She reports that her brother passed away in 16-Mar-2023 and she continues to grieve his death, and she often has difficulty falling asleep and staying asleep.   History of fatigue: She has chronic fatigue related to insomnia.   Primary osteoarthritis of both hands: She has PIP and DIP synovial thickening consistent with osteoarthritis of bilateral hands.  She has no tenderness or synovitis.  She experiences intermittent pain in both hands.  She has not had any joint swelling but does have joint stiffness.  She states that her joint pain is exacerbated by working  in the yard.  She uses paraffin wax at least once weekly which helps with her discomfort.  She is on methadone 5 mg 3 times daily for pain relief.  Primary osteoarthritis of both knees: She has good range of motion of bilateral knee joints.  No warmth or effusion was noted.  She is planning on starting to walk for exercise on a regular basis.  Trochanteric bursitis of both hips: She has tenderness over bilateral trochanter bursa.  She has been having difficulty walking  due to discomfort she experiences.  She has not been performing stretching exercises on a regular basis.  She requested bilateral trochanter bursa cortisone injections.  She tolerated the procedure well.  The procedure note was completed above.  We stressed the importance of performing stretching exercises.  Vitamin D deficiency: She is taking a vitamin D supplement.  DEXA was normal on 11/09/2014.  She will be due for updated DEXA in March 2021.  Other medical conditions are listed as follows:   History of sleep apnea  History of migraine  History of depression  Orders: Orders Placed This Encounter  Procedures   Large Joint Inj   No orders of the defined types were placed in this encounter.   Face-to-face time spent with patient was 30 minutes. Greater than 50% of time was spent in counseling and coordination of care.  Follow-Up Instructions: Return in about 6 months (around 11/18/2019) for Fibromyalgia, Osteoarthritis.   Ofilia Neas, PA-C  Note - This record has been created using Dragon software.  Chart creation errors have been sought, but may not always  have been located. Such creation errors do not reflect on  the standard of medical care.

## 2019-05-21 ENCOUNTER — Other Ambulatory Visit: Payer: Self-pay

## 2019-05-21 ENCOUNTER — Ambulatory Visit (INDEPENDENT_AMBULATORY_CARE_PROVIDER_SITE_OTHER): Payer: Medicare Other | Admitting: Physician Assistant

## 2019-05-21 ENCOUNTER — Encounter: Payer: Self-pay | Admitting: Physician Assistant

## 2019-05-21 VITALS — BP 130/67 | HR 76 | Resp 12 | Ht 66.0 in | Wt 180.6 lb

## 2019-05-21 DIAGNOSIS — M797 Fibromyalgia: Secondary | ICD-10-CM | POA: Diagnosis not present

## 2019-05-21 DIAGNOSIS — E559 Vitamin D deficiency, unspecified: Secondary | ICD-10-CM | POA: Diagnosis not present

## 2019-05-21 DIAGNOSIS — M19041 Primary osteoarthritis, right hand: Secondary | ICD-10-CM | POA: Diagnosis not present

## 2019-05-21 DIAGNOSIS — Z8659 Personal history of other mental and behavioral disorders: Secondary | ICD-10-CM | POA: Diagnosis not present

## 2019-05-21 DIAGNOSIS — M7062 Trochanteric bursitis, left hip: Secondary | ICD-10-CM | POA: Diagnosis not present

## 2019-05-21 DIAGNOSIS — Z87898 Personal history of other specified conditions: Secondary | ICD-10-CM | POA: Diagnosis not present

## 2019-05-21 DIAGNOSIS — M19042 Primary osteoarthritis, left hand: Secondary | ICD-10-CM

## 2019-05-21 DIAGNOSIS — Z8669 Personal history of other diseases of the nervous system and sense organs: Secondary | ICD-10-CM | POA: Diagnosis not present

## 2019-05-21 DIAGNOSIS — M7061 Trochanteric bursitis, right hip: Secondary | ICD-10-CM | POA: Diagnosis not present

## 2019-05-21 DIAGNOSIS — M170 Bilateral primary osteoarthritis of knee: Secondary | ICD-10-CM | POA: Diagnosis not present

## 2019-05-21 MED ORDER — LIDOCAINE HCL 1 % IJ SOLN
1.5000 mL | INTRAMUSCULAR | Status: AC | PRN
  Administered 2019-05-21: 1.5 mL

## 2019-05-21 MED ORDER — TRIAMCINOLONE ACETONIDE 40 MG/ML IJ SUSP
40.0000 mg | INTRAMUSCULAR | Status: AC | PRN
  Administered 2019-05-21: 40 mg via INTRA_ARTICULAR

## 2019-06-03 DIAGNOSIS — G894 Chronic pain syndrome: Secondary | ICD-10-CM | POA: Diagnosis not present

## 2019-06-03 DIAGNOSIS — G43109 Migraine with aura, not intractable, without status migrainosus: Secondary | ICD-10-CM | POA: Diagnosis not present

## 2019-06-03 DIAGNOSIS — Z79891 Long term (current) use of opiate analgesic: Secondary | ICD-10-CM | POA: Diagnosis not present

## 2019-06-03 DIAGNOSIS — M47812 Spondylosis without myelopathy or radiculopathy, cervical region: Secondary | ICD-10-CM | POA: Diagnosis not present

## 2019-06-19 DIAGNOSIS — Z23 Encounter for immunization: Secondary | ICD-10-CM | POA: Diagnosis not present

## 2019-07-08 ENCOUNTER — Telehealth: Payer: Self-pay | Admitting: Internal Medicine

## 2019-07-08 DIAGNOSIS — H25013 Cortical age-related cataract, bilateral: Secondary | ICD-10-CM | POA: Diagnosis not present

## 2019-07-08 DIAGNOSIS — H04123 Dry eye syndrome of bilateral lacrimal glands: Secondary | ICD-10-CM | POA: Diagnosis not present

## 2019-07-08 DIAGNOSIS — H2512 Age-related nuclear cataract, left eye: Secondary | ICD-10-CM | POA: Diagnosis not present

## 2019-07-08 NOTE — Telephone Encounter (Signed)
Spoke with pt, states that her pharmacy told her that they needed insurance authorization from [place] regarding her Modafinil. Called [facility], verified that a PA is needed.  Initiated PA via [email or web address] Key: [id number] This has been sent to plan for review.  Will hold in triage to follow up on.

## 2019-07-09 NOTE — Telephone Encounter (Signed)
Per [email or web address], PA has been approved until 01/05/2020.   Spoke with pharmacist at [facility]. They are aware.   Left detailed message for patient.

## 2019-09-02 ENCOUNTER — Encounter: Payer: Self-pay | Admitting: Internal Medicine

## 2019-09-02 ENCOUNTER — Other Ambulatory Visit: Payer: Self-pay

## 2019-09-02 ENCOUNTER — Ambulatory Visit (INDEPENDENT_AMBULATORY_CARE_PROVIDER_SITE_OTHER): Payer: Medicare Other | Admitting: Internal Medicine

## 2019-09-02 VITALS — BP 118/78 | HR 58 | Temp 97.1°F | Ht 66.0 in | Wt 183.0 lb

## 2019-09-02 DIAGNOSIS — F5101 Primary insomnia: Secondary | ICD-10-CM | POA: Diagnosis not present

## 2019-09-02 DIAGNOSIS — M797 Fibromyalgia: Secondary | ICD-10-CM

## 2019-09-02 DIAGNOSIS — G4733 Obstructive sleep apnea (adult) (pediatric): Secondary | ICD-10-CM

## 2019-09-02 MED ORDER — MODAFINIL 200 MG PO TABS: ORAL_TABLET | ORAL | 5 refills | Status: DC

## 2019-09-02 NOTE — Progress Notes (Signed)
Patient ID: Kari Flynn, female    DOB: Sep 19, 1950, 69 y.o.   MRN: [id number]  HPI F former smoker, followed for OSA, Insomnia, complicated by hx of fibromyalgia and migraine.  NPSG 01/10/11-AHI 27.7/hour, desaturation to 86% ------------------------------------------------------------  08/15/2018- 69 year old female former smoker followed for OSA, insomnia, complicated by history fibromyalgia, migraine CPAP 12/[facility] -----She is doing well with her cpap at this time.  Continues to use Provigil with help in managing fibromyalgia Download 97% compliance AHI 5.1/hour He denies any problems with CPAP "it saved my life".  Reviewed her download. Provigil helps with the fatigue aspect of her fibromyalgia, usually taken as one half tab each morning.       09/02/19-   69 year old female former smoker followed for OSA, insomnia, complicated by history fibromyalgia, migraine CPAP 12/[facility]  -----f/u OSA  Body weight today 183 lbs   Modafinil 200 mg   1/2-1 tab daily remains very helpful when needed.  Download compliance 97%, AHI 5.5/ hr    Sleeps well with CPAP. Machine is old.  Continues provigil daily and this also helps. Denies new medical concerns. Covid careful.  Manages fibromyalgia and arthritis pains.   ROS-see HPI + = positive Constitutional:   No-   weight loss, night sweats, fevers, chills, +fatigue, lassitude. HEENT:   No-  headaches, difficulty swallowing, tooth/dental problems, sore throat,       No-  sneezing, itching, ear ache, nasal congestion, post nasal drip,  CV:  No-   chest pain, orthopnea, PND, swelling in lower extremities, anasarca,  dizziness, palpitations Resp: No- acute  shortness of breath with exertion or at rest.              No-   productive cough,  No non-productive cough,  No- coughing up of blood.              No-   change in color of mucus.  No- wheezing.   Skin: No-   rash or lesions. GI:  No-   heartburn, indigestion, abdominal pain,  nausea, vomiting,  GU:  MS:  No-   joint pain or swelling.   Neuro-     nothing unusual Psych:  No- change in mood or affect. No depression or anxiety.  No memory loss.   Objective:   Physical Exam General- Alert, Oriented, Affect-normal, Distress- none acute; medium build Skin- rash-none, lesions- none, excoriation- none Lymphadenopathy- none Head- atraumatic            Eyes- Gross vision intact, PERRLA, conjunctivae clear secretions; periorbital edema+ mild            Ears- Hearing, canals-normal            Nose- Clear, no-Septal dev, mucus, polyps, erosion, perforation             Throat- Mallampati III-IV , mucosa clear , drainage- none, tonsils- atrophic Neck- flexible , trachea midline, no stridor , thyroid nl, carotid no bruit Chest - symmetrical excursion , unlabored           Heart/CV- RRR , no murmur , no gallop  , no rub, nl s1 s2                           - JVD- none , edema- none, stasis changes- none, varices- none           Lung- clear to P&A, wheeze- none, cough- none , dullness-none, rub- none  Chest wall-  Abd-  Br/ Gen/ Rectal- Not done, not indicated Extrem- cyanosis- none, clubbing, none, atrophy- none, strength- nl Neuro- grossly intact to observation

## 2019-09-02 NOTE — Patient Instructions (Signed)
Modafinil refill e-sent to [facility]  Order- DME [facility]- please replace old CPAP machine, change to auto 5-15, mask of choice, humidifier, supplies, [facility]/ card  Please call if we can help

## 2019-09-04 NOTE — Assessment & Plan Note (Signed)
She doesn't indicate this is a major impediment to her sleep.

## 2019-09-04 NOTE — Assessment & Plan Note (Addendum)
Benefits from CPAP and should continue. Plan- replace old machine, continue auto 5-15. Ok to continue modafinil for residual daytime somnolence- discussed.

## 2019-09-07 ENCOUNTER — Encounter: Payer: Self-pay | Admitting: Internal Medicine

## 2019-09-07 NOTE — Assessment & Plan Note (Signed)
Attentive to good sleep habit guidelines.

## 2019-09-15 ENCOUNTER — Telehealth: Payer: Self-pay | Admitting: Internal Medicine

## 2019-09-16 NOTE — Telephone Encounter (Signed)
I sent a CM to Estill Bamberg with [facility] and ask her to check with [facility] which is also under [place]. Estill Bamberg messaged me back stating that they have the sleep study.  [place], Festus Barren  We have it I finished this order today

## 2019-09-16 NOTE — Telephone Encounter (Signed)
Researching to try to find this info Kari Flynn

## 2019-10-06 ENCOUNTER — Ambulatory Visit: Payer: Medicare Other | Attending: Internal Medicine

## 2019-10-06 DIAGNOSIS — Z23 Encounter for immunization: Secondary | ICD-10-CM | POA: Insufficient documentation

## 2019-10-06 NOTE — Progress Notes (Signed)
   Covid-19 Vaccination Clinic  Name:  Kari Flynn    MRN: [id number] DOB: [birth year]  10/06/2019  Ms. Nyholm was observed post Covid-19 immunization for 15 minutes without incidence. She was provided with Vaccine Information Sheet and instruction to access the V-Safe system.   Ms. Emison was instructed to call 911 with any severe reactions post vaccine: Marland Kitchen Difficulty breathing  . Swelling of your face and throat  . A fast heartbeat  . A bad rash all over your body  . Dizziness and weakness    Immunizations Administered    Name Date Dose VIS Date Route   Pfizer COVID-19 Vaccine 10/06/2019 12:57 PM 0.3 mL 08/08/2019 Intramuscular   Manufacturer: [place]   Lot: [id number]   [place]: [id number]

## 2019-10-14 ENCOUNTER — Other Ambulatory Visit: Payer: Self-pay | Admitting: Rheumatology

## 2019-10-25 ENCOUNTER — Ambulatory Visit: Payer: Medicare Other

## 2019-10-30 ENCOUNTER — Ambulatory Visit: Payer: Medicare Other | Attending: Internal Medicine

## 2019-10-30 DIAGNOSIS — Z23 Encounter for immunization: Secondary | ICD-10-CM | POA: Insufficient documentation

## 2019-10-30 NOTE — Progress Notes (Signed)
   Covid-19 Vaccination Clinic  Name:  Kari Flynn    MRN: [id number] DOB: 06-24-1951  10/30/2019  [name] was observed post Covid-19 immunization for 15 minutes without incident. She was provided with Vaccine Information Sheet and instruction to access the V-Safe system.   [name] was instructed to call 911 with any severe reactions post vaccine: Marland Kitchen Difficulty breathing  . Swelling of face and throat  . A fast heartbeat  . A bad rash all over body  . Dizziness and weakness   Immunizations Administered    Name Date Dose VIS Date Route   Pfizer COVID-19 Vaccine 10/30/2019  9:38 AM 0.3 mL 08/08/2019 Intramuscular   Manufacturer: [place]   Lot: [place]   [place]: [id number]

## 2019-11-05 ENCOUNTER — Telehealth: Payer: Self-pay | Admitting: Internal Medicine

## 2019-11-05 DIAGNOSIS — G4733 Obstructive sleep apnea (adult) (pediatric): Secondary | ICD-10-CM

## 2019-11-05 NOTE — Telephone Encounter (Signed)
Suggest we order DME change autopap range to 5-20.  See if that helps.

## 2019-11-05 NOTE — Telephone Encounter (Signed)
Spoke with patient. She stated that she received a new CPAP machine from [place] back in January/February 2021. She has been noticing more headaches and increased fatigue for the past month. She has the [employer]Med app on her phone and it takes her AHI has been at 8 at times. I reviewed her download from [employer] and it stated that her average AHI for the past 30 days has been 6 events. I advised her that I would give the report to Dr. Annamaria Boots to review. She verbalized understanding.    Dr. Annamaria Boots, please advise. Thanks!

## 2019-11-05 NOTE — Telephone Encounter (Signed)
I called and spoke with the patient and made her aware of the suggestion from Dr. Annamaria Boots and she was agreeable. I have placed an order for setting adjustment to [place].

## 2019-11-10 NOTE — Progress Notes (Signed)
Office Visit Note  Patient: Kari Flynn             Date of Birth: 01-28-1951           MRN: [id number]             PCP: Elby Showers, MD Referring: Elby Showers, MD Visit Date: 11/19/2019 Occupation: '@GUAROCC' @  Subjective:  Pain in both hips.   History of Present Illness: Kari Flynn is a 69 y.o. female with history of fibromyalgia and osteoarthritis.  She states she has been having pain and discomfort in her bilateral trochanteric bursa for the last 3 months.  Her knee joints are not as painful currently.  She continues to have discomfort from fibromyalgia.  The pain in the cervical spine is tolerable.  She has been doing some stretching exercises.  Activities of Daily Living:  Patient reports morning stiffness for 2 hours.   Patient Denies nocturnal pain.  Difficulty dressing/grooming: Denies Difficulty climbing stairs: Reports Difficulty getting out of chair: Reports Difficulty using hands for taps, buttons, cutlery, and/or writing: Denies  Review of Systems  Constitutional: Positive for fatigue. Negative for night sweats, weight gain and weight loss.  HENT: Negative for mouth sores, trouble swallowing, trouble swallowing, mouth dryness and nose dryness.   Eyes: Negative for pain, redness, visual disturbance and dryness.  Respiratory: Negative for cough, shortness of breath and difficulty breathing.   Cardiovascular: Negative for chest pain, palpitations, hypertension, irregular heartbeat and swelling in legs/feet.  Gastrointestinal: Negative for blood in stool, constipation and diarrhea.  Endocrine: Negative for increased urination.  Genitourinary: Negative for vaginal dryness.  Musculoskeletal: Positive for arthralgias, joint pain, myalgias, morning stiffness and myalgias. Negative for joint swelling, muscle weakness and muscle tenderness.  Skin: Negative for color change, rash, hair loss, skin tightness, ulcers and sensitivity to sunlight.  Allergic/Immunologic:  Negative for susceptible to infections.  Neurological: Negative for dizziness, memory loss, night sweats and weakness.  Hematological: Negative for swollen glands.  Psychiatric/Behavioral: Negative for depressed mood and sleep disturbance. The patient is not nervous/anxious.     PMFS History:  Patient Active Problem List   Diagnosis Date Noted  . Cervical spondylosis without myelopathy 01/12/2016  . Primary osteoarthritis of first carpometacarpal joint of left hand 01/12/2016  . Osteoarthritis of both knees 03/22/2014  . Unspecified constipation 10/16/2013  . Insomnia 06/23/2010  . Obstructive sleep apnea 06/25/2008  . MIGRAINE HEADACHE 06/25/2008  . Fibromyalgia 06/25/2008  . Shortness of breath 06/25/2008    Past Medical History:  Diagnosis Date  . Arthritis   . Dyspnea   . Lump of breast, left 11/08/2016   scheduled for a diagnostic/US mammo bilateral and stereo, US aspiration and ductogram.   . Migraine, unspecified, without mention of intractable migraine without mention of status migrainosus   . Myalgia and myositis, unspecified   . Obstructive sleep apnea (adult) (pediatric)     Family History  Problem Relation Age of Onset  . Hypertension Mother   . Arthritis Mother   . Hyperlipidemia Mother   . Melanoma Mother   . Diverticulitis Mother   . Multiple myeloma Father   . Arthritis Other        sibling  . Sarcoidosis Other        sibling-also has NIDDM  . Diabetes Brother   . Sarcoidosis Brother   . Heart attack Brother   . Chronic Renal Failure Brother   . Diabetes Paternal Grandfather   . Colon cancer Neg Hx   .  Esophageal cancer Neg Hx   . Rectal cancer Neg Hx   . Stomach cancer Neg Hx    Past Surgical History:  Procedure Laterality Date  . BLEPHAROPLASTY    . BREAST BIOPSY     B9  . BUNIONECTOMY     x2  . WISDOM TOOTH EXTRACTION     Social History   Social History Narrative  . Not on file   Immunization History  Administered Date(s)  Administered  . Fluad Quad(high Dose 65+) 06/19/2019  . Influenza Split 06/09/2011, 08/01/2012, 05/28/2017  . Influenza Whole 06/24/2009, 06/07/2010  . Influenza, High Dose Seasonal PF 07/13/2014, 07/06/2015, 05/31/2017, 07/05/2018  . Influenza,inj,Quad PF,6+ Mos 08/01/2013  . Influenza-Unspecified 07/25/2014, 07/06/2015, 06/06/2016  . PFIZER SARS-COV-2 Vaccination 10/06/2019, 10/30/2019  . Pneumococcal Conjugate-13 11/05/2014  . Pneumococcal Polysaccharide-23 11/05/2017  . Tdap 06/09/2011     Objective: Vital Signs: BP 129/84 (BP Location: Left Arm, Patient Position: Sitting, Cuff Size: Small)   Pulse 73   Resp 12   Ht '5\' 6"'  (1.676 m)   Wt 184 lb (83.5 kg)   BMI 29.70 kg/m    Physical Exam Vitals and nursing note reviewed.  Constitutional:      Appearance: She is well-developed.  HENT:     Head: Normocephalic and atraumatic.  Eyes:     Conjunctiva/sclera: Conjunctivae normal.  Cardiovascular:     Rate and Rhythm: Normal rate and regular rhythm.     Heart sounds: Normal heart sounds.  Pulmonary:     Effort: Pulmonary effort is normal.     Breath sounds: Normal breath sounds.  Abdominal:     General: Bowel sounds are normal.     Palpations: Abdomen is soft.  Musculoskeletal:     Cervical back: Normal range of motion.  Lymphadenopathy:     Cervical: No cervical adenopathy.  Skin:    General: Skin is warm and dry.     Capillary Refill: Capillary refill takes less than 2 seconds.  Neurological:     Mental Status: She is alert and oriented to person, place, and time.  Psychiatric:        Behavior: Behavior normal.      Musculoskeletal Exam: Some limitation of rotation with C-spine was noted.  Shoulder joints and elbow joints were in good range of motion.  Wrist joints were in good range of motion.  She has some PIP and DIP thickening bilaterally.  She had tenderness over bilateral trochanteric bursa.  Knee joints in good range of motion.  No synovitis was  noted.  CDAI Exam: CDAI Score: -- Patient Global: --; Provider Global: -- Swollen: --; Tender: -- Joint Exam 11/19/2019   No joint exam has been documented for this visit   There is currently no information documented on the homunculus. Go to the Rheumatology activity and complete the homunculus joint exam.  Investigation: No additional findings.  Imaging: No results found.  Recent Labs: Lab Results  Component Value Date   WBC 4.0 02/13/2019   HGB 14.5 02/13/2019   PLT 224 02/13/2019   NA 143 11/07/2018   K 4.4 11/07/2018   CL 105 11/07/2018   CO2 29 11/07/2018   GLUCOSE 86 11/07/2018   BUN 26 (H) 11/07/2018   CREATININE 0.73 11/07/2018   BILITOT 0.5 11/07/2018   ALKPHOS 85 10/27/2016   AST 22 11/07/2018   ALT 17 11/07/2018   PROT 6.5 11/07/2018   ALBUMIN 4.1 10/27/2016   CALCIUM 9.8 11/07/2018   GFRAA 97 11/07/2018  Speciality Comments: No specialty comments available.  Procedures:  Large Joint Inj: bilateral greater trochanter on 11/19/2019 1:13 PM Indications: pain Details: 27 G 1.5 in needle, lateral approach  Arthrogram: No  Medications (Right): 1.5 mL lidocaine 1 %; 40 mg triamcinolone acetonide 40 MG/ML Medications (Left): 1.5 mL lidocaine 1 %; 40 mg triamcinolone acetonide 40 MG/ML Outcome: tolerated well, no immediate complications Procedure, treatment alternatives, risks and benefits explained, specific risks discussed. Consent was given by the patient. Immediately prior to procedure a time out was called to verify the correct patient, procedure, equipment, support staff and site/side marked as required. Patient was prepped and draped in the usual sterile fashion.     Allergies: Divalproex sodium and Valproic acid   Assessment / Plan:     Visit Diagnoses: Fibromyalgia -she is on methadone 5 mg 3 times daily for pain relief.  She continues to have some discomfort from fibromyalgia.  History of fatigue-secondary to insomnia and  fibromyalgia.  History of insomnia-she uses CPAP.  Primary osteoarthritis of both hands-joint protection was discussed.  Primary osteoarthritis of both knees-her knee joints are doing quite well currently.  Trochanteric bursitis of both hips-she had tenderness over bilateral trochanteric bursae and has difficulty with mobility.  Per her request after different treatment options were discussed bilateral trochanteric bursae were injected with cortisone as described above.  She tolerated the procedure well.  Cervical spondylosis without myelopathy-she is not having much discomfort she has limited range of motion.  Vitamin D deficiency - DEXA was normal on 11/09/2014.  She will be due for updated DEXA in March 2021.  Patient gets DEXA through her PCP.  She will be getting vitamin D level through her as well.  History of migraine  History of sleep apnea  History of depression  Orders: No orders of the defined types were placed in this encounter.  No orders of the defined types were placed in this encounter.     Follow-Up Instructions: Return in about 6 months (around 05/21/2020) for Osteoarthritis, FMS.   Bo Merino, MD  Note - This record has been created using Editor, commissioning.  Chart creation errors have been sought, but may not always  have been located. Such creation errors do not reflect on  the standard of medical care.

## 2019-11-19 ENCOUNTER — Other Ambulatory Visit: Payer: Self-pay

## 2019-11-19 ENCOUNTER — Ambulatory Visit (INDEPENDENT_AMBULATORY_CARE_PROVIDER_SITE_OTHER): Payer: Medicare Other | Admitting: Rheumatology

## 2019-11-19 ENCOUNTER — Encounter: Payer: Self-pay | Admitting: Physician Assistant

## 2019-11-19 VITALS — BP 129/84 | HR 73 | Resp 12 | Ht 66.0 in | Wt 184.0 lb

## 2019-11-19 DIAGNOSIS — Z87898 Personal history of other specified conditions: Secondary | ICD-10-CM | POA: Diagnosis not present

## 2019-11-19 DIAGNOSIS — M7062 Trochanteric bursitis, left hip: Secondary | ICD-10-CM

## 2019-11-19 DIAGNOSIS — E559 Vitamin D deficiency, unspecified: Secondary | ICD-10-CM

## 2019-11-19 DIAGNOSIS — M19042 Primary osteoarthritis, left hand: Secondary | ICD-10-CM | POA: Diagnosis not present

## 2019-11-19 DIAGNOSIS — M7061 Trochanteric bursitis, right hip: Secondary | ICD-10-CM

## 2019-11-19 DIAGNOSIS — M797 Fibromyalgia: Secondary | ICD-10-CM

## 2019-11-19 DIAGNOSIS — M47812 Spondylosis without myelopathy or radiculopathy, cervical region: Secondary | ICD-10-CM

## 2019-11-19 DIAGNOSIS — Z8669 Personal history of other diseases of the nervous system and sense organs: Secondary | ICD-10-CM

## 2019-11-19 DIAGNOSIS — M19041 Primary osteoarthritis, right hand: Secondary | ICD-10-CM

## 2019-11-19 DIAGNOSIS — Z8659 Personal history of other mental and behavioral disorders: Secondary | ICD-10-CM

## 2019-11-19 DIAGNOSIS — M170 Bilateral primary osteoarthritis of knee: Secondary | ICD-10-CM

## 2019-11-19 NOTE — Patient Instructions (Signed)
Iliotibial Band Syndrome Rehab Ask your health care provider which exercises are safe for you. Do exercises exactly as told by your health care provider and adjust them as directed. It is normal to feel mild stretching, pulling, tightness, or discomfort as you do these exercises. Stop right away if you feel sudden pain or your pain gets significantly worse. Do not begin these exercises until told by your health care provider. Stretching and range-of-motion exercises These exercises warm up your muscles and joints and improve the movement and flexibility of your hip and pelvis. Quadriceps stretch, prone  1. Lie on your abdomen on a firm surface, such as a bed or padded floor (prone position). 2. Bend your left / right knee and reach back to hold your ankle or pant leg. If you cannot reach your ankle or pant leg, loop a belt around your foot and grab the belt instead. 3. Gently pull your heel toward your buttocks. Your knee should not slide out to the side. You should feel a stretch in the front of your thigh and knee (quadriceps). 4. Hold this position for __________ seconds. Repeat __________ times. Complete this exercise __________ times a day. Iliotibial band stretch An iliotibial band is a strong band of muscle tissue that runs from the outer side of your hip to the outer side of your thigh and knee. 1. Lie on your side with your left / right leg in the top position. 2. Bend both of your knees and grab your left / right ankle. Stretch out your bottom arm to help you balance. 3. Slowly bring your top knee back so your thigh goes behind your trunk. 4. Slowly lower your top leg toward the floor until you feel a gentle stretch on the outside of your left / right hip and thigh. If you do not feel a stretch and your knee will not fall farther, place the heel of your other foot on top of your knee and pull your knee down toward the floor with your foot. 5. Hold this position for __________  seconds. Repeat __________ times. Complete this exercise __________ times a day. Strengthening exercises These exercises build strength and endurance in your hip and pelvis. Endurance is the ability to use your muscles for a long time, even after they get tired. Straight leg raises, side-lying This exercise strengthens the muscles that rotate the leg at the hip and move it away from your body (hip abductors). 1. Lie on your side with your left / right leg in the top position. Lie so your head, shoulder, hip, and knee line up. You may bend your bottom knee to help you balance. 2. Roll your hips slightly forward so your hips are stacked directly over each other and your left / right knee is facing forward. 3. Tense the muscles in your outer thigh and lift your top leg 4-6 inches (10-15 cm). 4. Hold this position for __________ seconds. 5. Slowly return to the starting position. Let your muscles relax completely before doing another repetition. Repeat __________ times. Complete this exercise __________ times a day. Leg raises, prone This exercise strengthens the muscles that move the hips (hip extensors). 1. Lie on your abdomen on your bed or a firm surface. You can put a pillow under your hips if that is more comfortable for your lower back. 2. Bend your left / right knee so your foot is straight up in the air. 3. Squeeze your buttocks muscles and lift your left / right thigh   off the bed. Do not let your back arch. 4. Tense your thigh muscle as hard as you can without increasing any knee pain. 5. Hold this position for __________ seconds. 6. Slowly lower your leg to the starting position and allow it to relax completely. Repeat __________ times. Complete this exercise __________ times a day. Hip hike 1. Stand sideways on a bottom step. Stand on your left / right leg with your other foot unsupported next to the step. You can hold on to the railing or wall for balance if needed. 2. Keep your knees  straight and your torso square. Then lift your left / right hip up toward the ceiling. 3. Slowly let your left / right hip lower toward the floor, past the starting position. Your foot should get closer to the floor. Do not lean or bend your knees. Repeat __________ times. Complete this exercise __________ times a day. This information is not intended to replace advice given to you by your health care provider. Make sure you discuss any questions you have with your health care provider. Document Revised: 12/05/2018 Document Reviewed: 06/05/2018 Elsevier Patient Education  2020 [employer]  

## 2019-12-03 ENCOUNTER — Encounter: Payer: Self-pay | Admitting: Internal Medicine

## 2019-12-04 ENCOUNTER — Ambulatory Visit (INDEPENDENT_AMBULATORY_CARE_PROVIDER_SITE_OTHER): Payer: Medicare Other | Admitting: Internal Medicine

## 2019-12-04 ENCOUNTER — Encounter: Payer: Self-pay | Admitting: Internal Medicine

## 2019-12-04 ENCOUNTER — Other Ambulatory Visit: Payer: Self-pay

## 2019-12-04 DIAGNOSIS — M797 Fibromyalgia: Secondary | ICD-10-CM

## 2019-12-04 DIAGNOSIS — G4733 Obstructive sleep apnea (adult) (pediatric): Secondary | ICD-10-CM | POA: Diagnosis not present

## 2019-12-04 NOTE — Patient Instructions (Addendum)
We can continue CPAP auto 5-20, mask of choice, humidifier, supplies, AirVew/ card  Please call if we can help

## 2019-12-04 NOTE — Progress Notes (Signed)
Patient ID: Kari Flynn, female    DOB: [birth year], 69 y.o.   MRN: [id number]  HPI F former smoker, followed for OSA, Insomnia, complicated by hx of fibromyalgia and migraine.  NPSG 01/10/11-AHI 27.7/hour, desaturation to 86% ------------------------------------------------------------        09/02/19-   69 year old female former smoker followed for OSA, insomnia, complicated by history fibromyalgia, migraine CPAP 12/[facility]  -----f/u OSA  Body weight today 183 lbs   Modafinil 200 mg   1/2-1 tab daily remains very helpful when needed.  Download compliance 97%, AHI 5.5/ hr    Sleeps well with CPAP. Machine is old.  Continues provigil daily and this also helps. Denies new medical concerns. Covid careful.  Manages fibromyalgia and arthritis pains.   12/04/19- 69 year old female former smoker followed for OSA, insomnia, complicated by history fibromyalgia, migraine CPAP 5-20/[facility]  She had reported more HA and fatigue in the month after getting replacement CPAP machine in Feb. Download AHI/ was 6. We changed her to auto 5-20. Now no longer getting HA and says she's doing ok.  Download compliance 97%, AHI 6.7/ hr Body weight today 181 lbs Comes today per insurance requirement that she be seen following replacement of cpap. Modafinil 200 mg still helpful for EDS. Occ naps.   ROS-see HPI + = positive Constitutional:   No-   weight loss, night sweats, fevers, chills, +fatigue, lassitude. HEENT:   No-  headaches, difficulty swallowing, tooth/dental problems, sore throat,       No-  sneezing, itching, ear ache, nasal congestion, post nasal drip,  CV:  No-   chest pain, orthopnea, PND, swelling in lower extremities, anasarca,  dizziness, palpitations Resp: No- acute  shortness of breath with exertion or at rest.              No-   productive cough,  No non-productive cough,  No- coughing up of blood.              No-   change in color of mucus.  No- wheezing.   Skin: No-   rash or lesions. GI:   No-   heartburn, indigestion, abdominal pain, nausea, vomiting,  GU:  MS:  No-   joint pain or swelling.   Neuro-     nothing unusual Psych:  No- change in mood or affect. No depression or anxiety.  No memory loss.   Objective:   Physical Exam General- Alert, Oriented, Affect-normal, Distress- none acute; medium build Skin- rash-none, lesions- none, excoriation- none Lymphadenopathy- none Head- atraumatic            Eyes- Gross vision intact, PERRLA, conjunctivae clear secretions; periorbital puffy+ mild            Ears- Hearing, canals-normal            Nose- Clear, no-Septal dev, mucus, polyps, erosion, perforation             Throat- Mallampati III-IV , mucosa clear , drainage- none, tonsils- atrophic Neck- flexible , trachea midline, no stridor , thyroid nl, carotid no bruit Chest - symmetrical excursion , unlabored           Heart/CV- RRR , no murmur , no gallop  , no rub, nl s1 s2                           - JVD- none , edema- none, stasis changes- none, varices- none  Lung- clear to P&A, wheeze- none, cough- none , dullness-none, rub- none           Chest wall-  Abd-  Br/ Gen/ Rectal- Not done, not indicated Extrem- cyanosis- none, clubbing, none, atrophy- none, strength- nl Neuro- grossly intact to observation

## 2019-12-05 ENCOUNTER — Other Ambulatory Visit: Payer: Medicare Other | Admitting: Internal Medicine

## 2019-12-05 DIAGNOSIS — E7800 Pure hypercholesterolemia, unspecified: Secondary | ICD-10-CM | POA: Diagnosis not present

## 2019-12-05 DIAGNOSIS — Z0000 Encounter for general adult medical examination without abnormal findings: Secondary | ICD-10-CM | POA: Diagnosis not present

## 2019-12-05 DIAGNOSIS — Z13220 Encounter for screening for lipoid disorders: Secondary | ICD-10-CM

## 2019-12-05 DIAGNOSIS — M797 Fibromyalgia: Secondary | ICD-10-CM | POA: Diagnosis not present

## 2019-12-05 DIAGNOSIS — Z1329 Encounter for screening for other suspected endocrine disorder: Secondary | ICD-10-CM | POA: Diagnosis not present

## 2019-12-05 DIAGNOSIS — M1812 Unilateral primary osteoarthritis of first carpometacarpal joint, left hand: Secondary | ICD-10-CM

## 2019-12-05 DIAGNOSIS — Z8669 Personal history of other diseases of the nervous system and sense organs: Secondary | ICD-10-CM | POA: Diagnosis not present

## 2019-12-05 DIAGNOSIS — M170 Bilateral primary osteoarthritis of knee: Secondary | ICD-10-CM | POA: Diagnosis not present

## 2019-12-05 DIAGNOSIS — M7061 Trochanteric bursitis, right hip: Secondary | ICD-10-CM | POA: Diagnosis not present

## 2019-12-05 DIAGNOSIS — M19042 Primary osteoarthritis, left hand: Secondary | ICD-10-CM | POA: Diagnosis not present

## 2019-12-05 DIAGNOSIS — M19041 Primary osteoarthritis, right hand: Secondary | ICD-10-CM | POA: Diagnosis not present

## 2019-12-05 DIAGNOSIS — G4733 Obstructive sleep apnea (adult) (pediatric): Secondary | ICD-10-CM

## 2019-12-06 LAB — LIPID PANEL
Cholesterol: 196 mg/dL (ref <200)
HDL: 81 mg/dL (ref >=50)
LDL Cholesterol (Calc): 101 mg/dL (calc) — ABNORMAL HIGH
Non-HDL Cholesterol (Calc): 115 mg/dL (calc) (ref <130)
Total CHOL/HDL Ratio: 2.4 (calc) (ref <5.0)
Triglycerides: 49 mg/dL (ref <150)

## 2019-12-06 LAB — CBC WITH DIFFERENTIAL/PLATELET
Absolute Monocytes: 451 cells/uL (ref 200–950)
Basophils Absolute: 19 cells/uL (ref 0–200)
Basophils Relative: 0.4 %
Eosinophils Absolute: 29 cells/uL (ref 15–500)
Eosinophils Relative: 0.6 %
HCT: 42.6 % (ref 35.0–45.0)
Hemoglobin: 14.3 g/dL (ref 11.7–15.5)
Lymphs Abs: 1118 cells/uL (ref 850–3900)
MCH: 30.4 pg (ref 27.0–33.0)
MCHC: 33.6 g/dL (ref 32.0–36.0)
MCV: 90.6 fL (ref 80.0–100.0)
MPV: 10.1 fL (ref 7.5–12.5)
Monocytes Relative: 9.4 %
Neutro Abs: 3182 cells/uL (ref 1500–7800)
Neutrophils Relative %: 66.3 %
Platelets: 220 10*3/uL (ref 140–400)
RBC: 4.7 10*6/uL (ref 3.80–5.10)
RDW: 12.8 % (ref 11.0–15.0)
Total Lymphocyte: 23.3 %
WBC: 4.8 10*3/uL (ref 3.8–10.8)

## 2019-12-06 LAB — COMPLETE METABOLIC PANEL WITH GFR
AG Ratio: 1.6 (calc) (ref 1.0–2.5)
ALT: 13 U/L (ref 6–29)
AST: 14 U/L (ref 10–35)
Albumin: 4.1 g/dL (ref 3.6–5.1)
Alkaline phosphatase (APISO): 90 U/L (ref 37–153)
BUN: 22 mg/dL (ref 7–25)
CO2: 29 mmol/L (ref 20–32)
Calcium: 9.4 mg/dL (ref 8.6–10.4)
Chloride: 104 mmol/L (ref 98–110)
Creat: 0.77 mg/dL (ref 0.60–0.93)
GFR, Est African American: 91 mL/min/{1.73_m2} (ref >=60)
GFR, Est Non African American: 78 mL/min/{1.73_m2} (ref >=60)
Globulin: 2.6 g/dL (calc) (ref 1.9–3.7)
Glucose, Bld: 93 mg/dL (ref 65–99)
Potassium: 4.7 mmol/L (ref 3.5–5.3)
Sodium: 142 mmol/L (ref 135–146)
Total Bilirubin: 0.7 mg/dL (ref 0.2–1.2)
Total Protein: 6.7 g/dL (ref 6.1–8.1)

## 2019-12-06 LAB — TSH: TSH: 2.99 mIU/L (ref 0.40–4.50)

## 2019-12-11 ENCOUNTER — Other Ambulatory Visit: Payer: Self-pay

## 2019-12-11 ENCOUNTER — Encounter: Payer: Self-pay | Admitting: Internal Medicine

## 2019-12-11 ENCOUNTER — Ambulatory Visit (INDEPENDENT_AMBULATORY_CARE_PROVIDER_SITE_OTHER): Payer: Medicare Other | Admitting: Internal Medicine

## 2019-12-11 VITALS — BP 100/70 | HR 70 | Temp 98.5°F | Ht 66.0 in | Wt 184.0 lb

## 2019-12-11 DIAGNOSIS — K5903 Drug induced constipation: Secondary | ICD-10-CM

## 2019-12-11 DIAGNOSIS — T402X5A Adverse effect of other opioids, initial encounter: Secondary | ICD-10-CM

## 2019-12-11 DIAGNOSIS — G4733 Obstructive sleep apnea (adult) (pediatric): Secondary | ICD-10-CM

## 2019-12-11 DIAGNOSIS — M170 Bilateral primary osteoarthritis of knee: Secondary | ICD-10-CM

## 2019-12-11 DIAGNOSIS — Z0000 Encounter for general adult medical examination without abnormal findings: Secondary | ICD-10-CM

## 2019-12-11 DIAGNOSIS — Z8669 Personal history of other diseases of the nervous system and sense organs: Secondary | ICD-10-CM

## 2019-12-11 DIAGNOSIS — M797 Fibromyalgia: Secondary | ICD-10-CM

## 2019-12-11 DIAGNOSIS — R251 Tremor, unspecified: Secondary | ICD-10-CM

## 2019-12-11 LAB — POCT URINALYSIS DIPSTICK
Appearance: NEGATIVE
Bilirubin, UA: NEGATIVE
Blood, UA: NEGATIVE
Glucose, UA: NEGATIVE
Ketones, UA: NEGATIVE
Leukocytes, UA: NEGATIVE
Nitrite, UA: NEGATIVE
Odor: NEGATIVE
Protein, UA: NEGATIVE
Spec Grav, UA: 1.01 (ref 1.010–1.025)
Urobilinogen, UA: 0.2 E.U./dL
pH, UA: 7.5 (ref 5.0–8.0)

## 2019-12-11 MED ORDER — HYOSCYAMINE SULFATE 0.125 MG SL SUBL: 0.1250 mg | SUBLINGUAL_TABLET | SUBLINGUAL | 2 refills | Status: DC | PRN

## 2019-12-11 NOTE — Progress Notes (Signed)
Subjective:    Patient ID: Kari Flynn, female    DOB: 01-19-1951, 69 y.o.   MRN: [id number]  HPI 69 year old Female for health maintenance exam, Medicare wellness, and evaluation of medical issues.  She receives disability benefits for history of migraine headaches and fibromyalgia syndrome.  She has had significant issues with constipation aggravated by her pain medication.  Fibromyalgia and knee arthritis followed by Dr.Deveshwar, Rheumatologist.  Has knee injections over the as-needed basis.  Sees rheumatologist every 6 months and has trochanters injected every 6 months.  History of sleep apnea and uses CPAP device.  She is on Provigil, Flexeril, methadone Robaxin and as needed for asthma  Dr. Olevia Perches did colonoscopy 2015 for right lower quadrant abdominal pain which was normal.  Infected laceration of right leg during the Summer 2015.  Past medical history: D&C 1979.  Bilateral tubal ligation 1987.  Bunionectomy 1990.  Benign right breast biopsy February 1995.  Social history: She is married.  2 adult son.  Does not smoke.  She formerly worked as a Writer at [employer].  Family history: Brother had MI with history of diabetes mellitus chronic kidney disease after MI.  Mother died September 14, 2013 with congestive heart failure.  Mother had multiple myeloma, hypertension and gout.   Labs reviewed and are WNL. May need cataract surgery  Review of Systems occasional abdominal pain about twice a week take Levsin and has cut out nuts. Has developed tremor left greater than right and pain manganement doctor is sending her to [facility]. Had colonoscopy 2015 which was normal. Suspect 10 year follow up suggested. Had mammogram July 2020.     Objective:   Physical Exam Weight 184 pounds increase of 10 pounds since last year. Pulse 70. BMI 29.70  Bilateral hand tremor left greater than right.  Skin warm and dry.  Nodes none.  TMs are clear.  Neck is supple.  No  thyromegaly.  No carotid bruits.  Chest clear to auscultation.  Breast without masses.  Cardiac exam regular rate and rhythm.  Abdomen no hepatosplenomegaly masses or tenderness.  Bimanual normal.  Pap done 2018.  Does not need to be repeated due to age.  No lower extremity edema.  Neuro other than tremor no gross focal deficits.  Mood affect and judgment are normal.       Assessment & Plan:  10 pound weight gain-work on diet and exercise although exercise is somewhat limited due to fibromyalgia and other musculoskeletal pain.  Migraine headaches treated by chronic pain management  Fibromyalgia treated by chronic pain management and rheumatologist  Primary osteoarthritis of both knees treated by rheumatologist  Opioid-induced constipation  History of benign positional vertigo treated with as needed Antivert.  Bilateral tremor to be evaluated by neurologist  Plan: Continue current medications.  Have a neurology evaluation of tremor.  Return in 1 year or as needed.  LDL is very slightly elevated at 101 otherwise TSH, dipstick UA, CBC and c-Met are within normal limits.  Subjective:   Patient presents for Medicare Annual/Subsequent preventive examination.  Review Past Medical/Family/Social: See above   Risk Factors  Current exercise habits: Not a lot of exercise due to musculoskeletal issues Dietary issues discussed: Low-fat low carbohydrate  Cardiac risk factors: Family history Depression Screen  (Note: if answer to either of the following is "Yes", a more complete depression screening is indicated)   Over the past two weeks, have you felt down, depressed or hopeless? No  Over  the past two weeks, have you felt little interest or pleasure in doing things? No Have you lost interest or pleasure in daily life? No Do you often feel hopeless? No Do you cry easily over simple problems? No   Activities of Daily Living  In your present state of health, do you have any difficulty  performing the following activities?:   Driving? No  Managing money? No  Feeding yourself? No  Getting from bed to chair? No  Climbing a flight of stairs? No  Preparing food and eating?: No  Bathing or showering? No  Getting dressed: No  Getting to the toilet? No  Using the toilet:No  Moving around from place to place: No  In the past year have you fallen or had a near fall?:No  Are you sexually active? No  Do you have more than one partner? No   Hearing Difficulties: No  Do you often ask people to speak up or repeat themselves? No  Do you experience ringing or noises in your ears? No  Do you have difficulty understanding soft or whispered voices? No  Do you feel that you have a problem with memory?  Occasionally do you often misplace items? No    Home Safety:  Do you have a smoke alarm at your residence? Yes Do you have grab bars in the bathroom?  None Do you have throw rugs in your house?  Yes   Cognitive Testing  Alert? Yes Normal Appearance?Yes  Oriented to person? Yes Place? Yes  Time? Yes  Recall of three objects? Yes  Can perform simple calculations? Yes  Displays appropriate judgment?Yes  Can read the correct time from a watch face?Yes   List the Names of Other Physician/Practitioners you currently use:  See referral list for the physicians patient is currently seeing.  Rheumatologist  Pain management physician  To see [facility] regarding tremor soon   Review of Systems: See above   Objective:     General appearance: Appears stated age and mildly obese  Head: Normocephalic, without obvious abnormality, atraumatic  Eyes: conj clear, EOMi PEERLA  Ears: normal TM's and external ear canals both ears  Nose: Nares normal. Septum midline. Mucosa normal. No drainage or sinus tenderness.  Throat: lips, mucosa, and tongue normal; teeth and gums normal  Neck: no adenopathy, no carotid bruit, no JVD, supple, symmetrical, trachea midline and thyroid not  enlarged, symmetric, no tenderness/mass/nodules  No CVA tenderness.  Lungs: clear to auscultation bilaterally  Breasts: normal appearance Heart: regular rate and rhythm, S1, S2 normal, no murmur, click, rub or gallop  Abdomen: soft, non-tender; bowel sounds normal; no masses, no organomegaly  Musculoskeletal: ROM normal in all joints, no crepitus, no deformity, Normal muscle strengthen. Back  is symmetric, no curvature. Skin: Skin color, texture, turgor normal. No rashes or lesions  Lymph nodes: Cervical, supraclavicular, and axillary nodes normal.  Neurologic: CN 2 -12 Normal, Normal symmetric reflexes. Normal coordination and gait  Psych: Alert & Oriented x 3, Mood appear stable.    Assessment:    Annual wellness medicare exam   Plan:    During the course of the visit the patient was educated and counseled about appropriate screening and preventive services including:   Annual mammogram  Check with gastroenterology about when you are due for repeat colonoscopy  Has had 2 COVID-19 vaccines.  Tetanus immunization due next year     Patient Instructions (the written plan) was given to the patient.  Medicare Attestation  I have  personally reviewed:  The patient's medical and social history  Their use of alcohol, tobacco or illicit drugs  Their current medications and supplements  The patient's functional ability including ADLs,fall risks, home safety risks, cognitive, and hearing and visual impairment  Diet and physical activities  Evidence for depression or mood disorders  The patient's weight, height, BMI, and visual acuity have been recorded in the chart. I have made referrals, counseling, and provided education to the patient based on review of the above and I have provided the patient with a written personalized care plan for preventive services.

## 2019-12-11 NOTE — Patient Instructions (Addendum)
It was a pleasure to see you today. Would advise seeing Dr. Wilhemina Bonito about toe nails. See [facility] about hand tremors. Continue current meds RTC one year or as needed.

## 2019-12-23 NOTE — Assessment & Plan Note (Signed)
Benefits from CPAP and now feels comfortable. Control is acceptable. Plan- continue auto 5-20

## 2019-12-23 NOTE — Assessment & Plan Note (Signed)
Fatigue she associates with fibromyalgia is non-specific. She understands importance of adequate sleep. Provigil seems to help quality of life.

## 2020-01-07 DIAGNOSIS — H524 Presbyopia: Secondary | ICD-10-CM | POA: Diagnosis not present

## 2020-01-07 DIAGNOSIS — D3132 Benign neoplasm of left choroid: Secondary | ICD-10-CM | POA: Diagnosis not present

## 2020-01-07 DIAGNOSIS — H43813 Vitreous degeneration, bilateral: Secondary | ICD-10-CM | POA: Diagnosis not present

## 2020-01-07 DIAGNOSIS — H2513 Age-related nuclear cataract, bilateral: Secondary | ICD-10-CM | POA: Diagnosis not present

## 2020-01-15 ENCOUNTER — Encounter: Payer: Self-pay | Admitting: Neurology

## 2020-01-15 ENCOUNTER — Other Ambulatory Visit: Payer: Self-pay

## 2020-01-15 ENCOUNTER — Ambulatory Visit (INDEPENDENT_AMBULATORY_CARE_PROVIDER_SITE_OTHER): Payer: Medicare Other | Admitting: Neurology

## 2020-01-15 VITALS — BP 139/78 | HR 68 | Ht 65.5 in | Wt 181.3 lb

## 2020-01-15 DIAGNOSIS — G250 Essential tremor: Secondary | ICD-10-CM

## 2020-01-15 NOTE — Patient Instructions (Addendum)
You have a mild tremor of both hands. 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  I do not see any signs or symptoms of parkinson's like disease or what we call parkinsonism.   For your tremor, I would not recommend any new medication for fear of side effects (especially sleepiness) or medication interactions, especially in light of you taking some high risk medications.  Your neurological exam otherwise is benign.  We do not have to make a follow up appointment.   Please remember, that any kind of tremor may be exacerbated by anxiety, anger, nervousness, excitement, dehydration, sleep deprivation, by caffeine, thyroid dysfunction, and low blood sugar values or blood sugar fluctuations. Some medications can exacerbate tremors.  Please make sure you have a regular checkup with your primary care physician and blood work to check your thyroid function at least once a year.  Your latest TSH from about a month ago was normal.

## 2020-01-15 NOTE — Progress Notes (Signed)
Subjective:    Patient ID: Kari Flynn is a 69 y.o. female.  HPI     Star Age, MD, PhD [facility] [address], [address] [place], [place] [address]  Dear Dr. Hardin Negus,  I saw your patient, Kari Flynn, upon your kind request in my neurologic clinic today for initial consultation of her tremors.  The patient is unaccompanied today.  As you know, Kari Flynn is a 69 year old right-handed woman with an underlying medical history of Obstructive sleep apnea, migraines, arthritis, fibromyalgia, chronic pain, and overweight state, who reports a bilateral upper extremity tremor for the past 3 to 4 years, more noticeable on the left side.  In the past 3 to 4 months she noticed a more consistent tremor.  She reports that her brother had a significant hand tremor and she recalls that her maternal grandmother and 1 maternal aunt had hand tremors.  She is not particularly bothered by the tremor but has noticed the worsening.  She is followed by rheumatology for her fibromyalgia.  Of note, she takes chronic narcotic pain medication, she is on methadone.  She also takes Provigil daily.  She is followed for her sleep apnea by pulmonology.  She uses CPAP regularly she reports and has been on treatment for sleep apnea for about 13 years.  She tries to hydrate well, estimates that she drinks about 5 large glasses of water per day, has noticed that caffeine sometimes makes the tremor worse, she limits her coffee to 2 cups each morning, generally no other caffeine during the day.  She drinks alcohol occasionally, typically averages 1 glass of wine and 1 beer per week.  Her Past Medical History Is Significant For: Past Medical History:  Diagnosis Date  . Arthritis   . Dyspnea   . Lump of breast, left 11/08/2016   scheduled for a diagnostic/US mammo bilateral and stereo, US aspiration and ductogram.   . Migraine, unspecified, without mention of intractable migraine without  mention of status migrainosus   . Myalgia and myositis, unspecified   . Obstructive sleep apnea (adult) (pediatric)     Her Past Surgical History Is Significant For: Past Surgical History:  Procedure Laterality Date  . BLEPHAROPLASTY    . BREAST BIOPSY     B9  . BUNIONECTOMY     x2  . WISDOM TOOTH EXTRACTION      Her Family History Is Significant For: Family History  Problem Relation Age of Onset  . Hypertension Mother   . Arthritis Mother   . Hyperlipidemia Mother   . Melanoma Mother   . Diverticulitis Mother   . Multiple myeloma Father   . Arthritis Other        sibling  . Sarcoidosis Other        sibling-also has NIDDM  . Diabetes Brother   . Sarcoidosis Brother   . Heart attack Brother   . Chronic Renal Failure Brother   . Diabetes Paternal Grandfather   . Colon cancer Neg Hx   . Esophageal cancer Neg Hx   . Rectal cancer Neg Hx   . Stomach cancer Neg Hx     Her Social History Is Significant For: Social History   Socioeconomic History  . Marital status: Married    Spouse name: Not on file  . Number of children: Not on file  . Years of education: Not on file  . Highest education level: Not on file  Occupational History  . Not on file  Tobacco Use  . Smoking status: Former Smoker    Packs/day: 1.00    Years: 10.00    Pack years: 10.00    Types: Cigarettes    Quit date: 08/28/1977    Years since quitting: 42.4  . Smokeless tobacco: Never Used  Substance and Sexual Activity  . Alcohol use: Yes    Alcohol/week: 3.0 standard drinks    Types: 3 Glasses of wine per week    Comment: monthly  . Drug use: Never  . Sexual activity: Not on file  Other Topics Concern  . Not on file  Social History Narrative  . Not on file   Social Determinants of Health   Financial Resource Strain:   . Difficulty of Paying Living Expenses:   Food Insecurity:   . Worried About Charity fundraiser in the Last Year:   . Arboriculturist in the Last Year:   Transportation  Needs:   . Film/video editor (Medical):   Marland Kitchen Lack of Transportation (Non-Medical):   Physical Activity:   . Days of Exercise per Week:   . Minutes of Exercise per Session:   Stress:   . Feeling of Stress :   Social Connections:   . Frequency of Communication with Friends and Family:   . Frequency of Social Gatherings with Friends and Family:   . Attends Religious Services:   . Active Member of Clubs or Organizations:   . Attends Archivist Meetings:   Marland Kitchen Marital Status:     Her Allergies Are:  Allergies  Allergen Reactions  . Divalproex Sodium Other (See Comments)    Severe diarrhea & extreme hair loss  . Valproic Acid     Hair loss  :   Her Current Medications Are:  Outpatient Encounter Medications as of 01/15/2020  Medication Sig  . Acetaminophen (TYLENOL PO) Take by mouth as needed.  . Alpha-Lipoic Acid 200 MG CAPS Take by mouth.  Marland Kitchen aspirin 81 MG tablet Take 81 mg by mouth daily.  . B Complex-Biotin-FA (B-COMPLEX PO) Take by mouth.  . Cholecalciferol (VITAMIN D) 2000 UNITS CAPS Take 1 capsule by mouth daily.    . diclofenac sodium (VOLTAREN) 1 % GEL Apply 4 g topically 4 (four) times daily.  . frovatriptan (FROVA) 2.5 MG tablet Take 2.5 mg by mouth as needed for migraine. If recurs, may repeat after 2 hours. Max of 3 tabs in 24 hours.  . hyoscyamine (LEVSIN SL) 0.125 MG SL tablet Place 1 tablet (0.125 mg total) under the tongue every 4 (four) hours as needed for cramping.  . meclizine (ANTIVERT) 25 MG tablet Take 1 tablet (25 mg total) by mouth 3 (three) times daily as needed for dizziness.  Marland Kitchen MELATONIN MAXIMUM STRENGTH 5 MG TABS See admin instructions.  . methadone (DOLOPHINE) 5 MG tablet Take 5 mg by mouth every 8 (eight) hours.   Marland Kitchen MISC NATURAL PRODUCTS PO Take 2 tablets by mouth daily. Avocado300 Soy Unsaponifiables  . modafinil (PROVIGIL) 200 MG tablet TAKE HALF TO ONE TABLET BY MOUTH DAILY AS NEEDED  . Multiple Vitamins-Minerals (OCUVITE ADULT 50+ PO)  Take by mouth.  . naproxen sodium (ALEVE) 220 MG tablet Take 220 mg by mouth daily as needed (pain). Take as directed as needed  . Nutritional Supplements (GLUCOSAMINE COMPLEX) TABS Take 1 tablet by mouth daily.    . Omega-3 Fatty Acids (FISH OIL) 1200 MG CAPS Take 1 capsule by mouth daily.    . [DISCONTINUED] b complex vitamins  tablet Take 1 tablet by mouth daily.  . [DISCONTINUED] Calcium-Vitamin D 500-125 MG-UNIT TABS Take 2 tablets by mouth daily.     No facility-administered encounter medications on file as of 01/15/2020.  : Review of Systems:  Out of a complete 14 point review of systems, all are reviewed and negative with the exception of these symptoms as listed below:  Review of Systems  Neurological:       Here for consult on worsening bilateral hand tremors. Pt reports her left hand is worse than right.     Objective:  Neurological Exam  Physical Exam Physical Examination:   Vitals:   01/15/20 1019  BP: 139/78  Pulse: 68    General Examination: The patient is a very pleasant 69 y.o. female in no acute distress. She appears well-developed and well-nourished and well groomed.   HEENT: Normocephalic, atraumatic, pupils are equal, round and reactive to light and accommodation. Extraocular tracking is well preserved.  She has no facial masking, hearing is grossly intact to perhaps mildly impaired.  Face is symmetric.  Neck is supple, no nuchal rigidity.  She has no lip, neck or jaw tremor, no voice tremor.  Airway examination reveals mild mouth dryness, tongue protrudes centrally and palate elevates symmetrically, speech is clear, no dysarthria, no hypophonia.    Chest: Clear to auscultation without wheezing, rhonchi or crackles noted.  Heart: S1+S2+0, regular and normal without murmurs, rubs or gallops noted.   Abdomen: Soft, non-tender and non-distended with normal bowel sounds appreciated on auscultation.  Extremities: There is no pitting edema in the distal lower  extremities bilaterally.  Skin: Warm and dry without trophic changes noted.  Musculoskeletal: exam reveals increase in lumbar kyphosis, slight upper body tilt to the left when standing.  Some arthritic changes in her hands.    Neurologically:  Mental status: The patient is awake, alert and oriented in all 4 spheres. Her immediate and remote memory, attention, language skills and fund of knowledge are appropriate. There is no evidence of aphasia, agnosia, apraxia or anomia. Speech is clear with normal prosody and enunciation. Thought process is linear. Mood is normal and affect is normal.  Cranial nerves II - XII are as described above under HEENT exam. In addition: shoulder shrug is normal with equal shoulder height noted. Motor exam: Normal bulk, strength and tone is noted. There is no drift, resting tremor or rebound.  On 01/15/2020: On Archimedes spiral drawing she has no significant trembling with the right hand, handwriting with right hand which is her dominant hand is legible, not tremulous, not micrographic.  Archimedes spiral with the left hand shows slight trembling.  She has a slight upper extremity postural tremor in the right hand, a little bit more consistent and noticeable in the left hand, very slight action tremor in both upper extremities, no intention tremor, no resting tremor.   Reflexes are 2+ throughout. Babinski: Toes are flexor bilaterally. Fine motor skills and coordination: intact with normal finger taps, normal hand movements, normal rapid alternating patting, normal foot taps and normal foot agility.  Cerebellar testing: No dysmetria or intention tremor on finger to nose testing. Heel to shin is unremarkable bilaterally. There is no truncal or gait ataxia.  Sensory exam: intact to light touch in the upper and lower extremities.  Gait, station and balance: She stands easily. No veering to one side is noted. No leaning to one side is noted. Posture is age-appropriate and stance  is narrow based. Gait shows normal stride length and  normal pace. No problems turning are noted.  No shuffling, preserved arm swing bilaterally.               Assessment and Plan:   In summary, Kari Flynn is a very pleasant 69 y.o.-year old female with an underlying medical history of Obstructive sleep apnea, migraines, arthritis, fibromyalgia, chronic pain, and overweight state, who presents for evaluation of her upper extremity tremors of 3 to 4 years duration.  She has noticed some worsening in the past 3 to 4 months, particularly in the left hand.  On examination, she has a rather mild bilateral upper extremity postural and action tremor, no evidence of parkinsonism.  She is largely reassured.  She has a family history of tremors, including her brother, her maternal grandmother and a maternal aunt.  Her history and examination and family history are in keeping with familial tremor.  Her findings are on the mild side and she is not particularly bothered by them.  We talked about potentially utilizing symptomatic treatment in the future but for now I would not favor any additional medication for her because she is already on multiple medications including some high risk medication.  She is advised to stay well-hydrated, limit her caffeine which she already does.  She has noticed that caffeine could be a trigger but other than that we talked about other potential triggers including thyroid dysfunction, dehydration, blood sugar fluctuations, stress, anxiety, poor sleep.  She is advised to follow-up on an as-needed basis.  I answered all her questions today and she was in agreement with the plan.   Thank you very much for allowing me to participate in the care of this nice patient. If I can be of any further assistance to you please do not hesitate to call me at [phone].  Sincerely,   Star Age, MD, PhD

## 2020-01-27 DIAGNOSIS — G43109 Migraine with aura, not intractable, without status migrainosus: Secondary | ICD-10-CM | POA: Diagnosis not present

## 2020-01-27 DIAGNOSIS — G894 Chronic pain syndrome: Secondary | ICD-10-CM | POA: Diagnosis not present

## 2020-01-27 DIAGNOSIS — M47812 Spondylosis without myelopathy or radiculopathy, cervical region: Secondary | ICD-10-CM | POA: Diagnosis not present

## 2020-01-27 DIAGNOSIS — Z79891 Long term (current) use of opiate analgesic: Secondary | ICD-10-CM | POA: Diagnosis not present

## 2020-02-19 DIAGNOSIS — H25811 Combined forms of age-related cataract, right eye: Secondary | ICD-10-CM | POA: Diagnosis not present

## 2020-03-11 DIAGNOSIS — H25812 Combined forms of age-related cataract, left eye: Secondary | ICD-10-CM | POA: Diagnosis not present

## 2020-03-29 DIAGNOSIS — Z79891 Long term (current) use of opiate analgesic: Secondary | ICD-10-CM | POA: Diagnosis not present

## 2020-03-29 DIAGNOSIS — M47812 Spondylosis without myelopathy or radiculopathy, cervical region: Secondary | ICD-10-CM | POA: Diagnosis not present

## 2020-03-29 DIAGNOSIS — G43109 Migraine with aura, not intractable, without status migrainosus: Secondary | ICD-10-CM | POA: Diagnosis not present

## 2020-03-29 DIAGNOSIS — G894 Chronic pain syndrome: Secondary | ICD-10-CM | POA: Diagnosis not present

## 2020-04-22 ENCOUNTER — Other Ambulatory Visit: Payer: Self-pay | Admitting: Internal Medicine

## 2020-04-22 NOTE — Telephone Encounter (Signed)
Modafinil refill e-sent 

## 2020-04-28 ENCOUNTER — Other Ambulatory Visit: Payer: Self-pay | Admitting: Internal Medicine

## 2020-04-28 DIAGNOSIS — Z1231 Encounter for screening mammogram for malignant neoplasm of breast: Secondary | ICD-10-CM

## 2020-05-07 NOTE — Progress Notes (Signed)
Office Visit Note  Patient: Kari Flynn             Date of Birth: Oct 03, 1950           MRN: [id number]             PCP: Elby Showers, MD Referring: Elby Showers, MD Visit Date: 05/19/2020 Occupation: '@GUAROCC' @  Subjective:  Trochanteric bursitis of both hips   History of Present Illness: Kari Flynn is a 69 y.o. female with history of fibromyalgia and osteoarthritis.  She has been experiencing more frequent fibromyalgia flares.  She presents today with discomfort due to trochanter bursitis bilaterally.  She perform stretching exercises on a regular basis but has been having significant discomfort recently.  Her discomfort is exacerbated by walking prolonged distances.  She has been trying to walk in her neighborhood for exercise recently.  She is also been performing yard work this summer which she feels exacerbates some of her hand pain as well as myalgias due to fibromyalgia.  She had a flare of fibromyalgia about 2 weeks ago and was experiencing severe fatigue at that time. She has not had a bone density since 2016.  She continues to take a vitamin D supplement on a daily basis.  She denies any recent falls or fractures.     Activities of Daily Living:  Patient reports morning stiffness for 2 hours.   Patient Reports nocturnal pain.  Difficulty dressing/grooming: Denies Difficulty climbing stairs: Reports Difficulty getting out of chair: Reports Difficulty using hands for taps, buttons, cutlery, and/or writing: Denies  Review of Systems  Constitutional: Positive for fatigue.  HENT: Negative for mouth sores, mouth dryness and nose dryness.   Eyes: Negative for pain, itching and dryness.  Respiratory: Negative for shortness of breath, wheezing and difficulty breathing.   Cardiovascular: Negative for chest pain and palpitations.  Gastrointestinal: Negative for abdominal pain, blood in stool, constipation and diarrhea.  Endocrine: Negative for increased urination.    Genitourinary: Negative for difficulty urinating and painful urination.  Musculoskeletal: Positive for arthralgias, joint pain, myalgias, muscle weakness, morning stiffness, muscle tenderness and myalgias. Negative for joint swelling.  Skin: Negative for color change, rash and redness.  Allergic/Immunologic: Negative for susceptible to infections.  Neurological: Positive for numbness, headaches and weakness. Negative for dizziness and memory loss.  Hematological: Positive for bruising/bleeding tendency.  Psychiatric/Behavioral: Negative for confusion and sleep disturbance.    PMFS History:  Patient Active Problem List   Diagnosis Date Noted  . Cervical spondylosis without myelopathy 01/12/2016  . Primary osteoarthritis of first carpometacarpal joint of left hand 01/12/2016  . Osteoarthritis of both knees 03/22/2014  . Unspecified constipation 10/16/2013  . Insomnia 06/23/2010  . Obstructive sleep apnea 06/25/2008  . MIGRAINE HEADACHE 06/25/2008  . Fibromyalgia 06/25/2008  . Shortness of breath 06/25/2008    Past Medical History:  Diagnosis Date  . Arthritis   . Dyspnea   . Lump of breast, left 11/08/2016   scheduled for a diagnostic/US mammo bilateral and stereo, US aspiration and ductogram.   . Migraine, unspecified, without mention of intractable migraine without mention of status migrainosus   . Myalgia and myositis, unspecified   . Obstructive sleep apnea (adult) (pediatric)     Family History  Problem Relation Age of Onset  . Hypertension Mother   . Arthritis Mother   . Hyperlipidemia Mother   . Melanoma Mother   . Diverticulitis Mother   . Multiple myeloma Father   . Arthritis Other  sibling  . Sarcoidosis Other        sibling-also has NIDDM  . Diabetes Brother   . Sarcoidosis Brother   . Heart attack Brother   . Chronic Renal Failure Brother   . Diabetes Paternal Grandfather   . Colon cancer Neg Hx   . Esophageal cancer Neg Hx   . Rectal cancer Neg Hx    . Stomach cancer Neg Hx    Past Surgical History:  Procedure Laterality Date  . BLEPHAROPLASTY    . BREAST BIOPSY     B9  . BUNIONECTOMY     x2  . CATARACT EXTRACTION, BILATERAL     01/2020, 02/2020  . WISDOM TOOTH EXTRACTION     Social History   Social History Narrative  . Not on file   Immunization History  Administered Date(s) Administered  . Fluad Quad(high Dose 65+) 06/19/2019  . Influenza Split 06/09/2011, 08/01/2012, 05/28/2017  . Influenza Whole 06/24/2009, 06/07/2010  . Influenza, High Dose Seasonal PF 07/13/2014, 07/06/2015, 05/31/2017, 07/05/2018  . Influenza,inj,Quad PF,6+ Mos 08/01/2013  . Influenza-Unspecified 07/25/2014, 07/06/2015, 06/06/2016  . PFIZER SARS-COV-2 Vaccination 10/06/2019, 10/30/2019  . Pneumococcal Conjugate-13 11/05/2014  . Pneumococcal Polysaccharide-23 11/05/2017  . Tdap 06/09/2011     Objective: Vital Signs: BP 117/73 (BP Location: Left Arm, Patient Position: Sitting, Cuff Size: Normal)   Pulse 75   Resp 15   Ht '5\' 6"'  (1.676 m)   Wt 185 lb (83.9 kg)   BMI 29.86 kg/m    Physical Exam Vitals and nursing note reviewed.  Constitutional:      Appearance: She is well-developed.  HENT:     Head: Normocephalic and atraumatic.  Eyes:     Conjunctiva/sclera: Conjunctivae normal.  Pulmonary:     Effort: Pulmonary effort is normal.  Abdominal:     Palpations: Abdomen is soft.  Musculoskeletal:     Cervical back: Normal range of motion.  Skin:    General: Skin is warm and dry.     Capillary Refill: Capillary refill takes less than 2 seconds.  Neurological:     Mental Status: She is alert and oriented to person, place, and time.  Psychiatric:        Behavior: Behavior normal.      Musculoskeletal Exam: C-spine limited ROM with lateral rotation.  Trapezius muscle tension and tenderness bilaterally. Thoracic and lumbar spine good ROM.  Shoulder joints, elbow joints, wrist joints, MCPs, PIPs, and DIPs good ROM with no synovitis.  PIP  and DIP thickening consistent with osteoarthritis of both hands.  Hip joints good ROM with no discomfort.  Tenderness over both trochanteric bursa bilaterally.  Knee joints and ankle joints good ROM with no tenderness or swelling.   CDAI Exam: CDAI Score: -- Patient Global: --; Provider Global: -- Swollen: --; Tender: -- Joint Exam 05/19/2020   No joint exam has been documented for this visit   There is currently no information documented on the homunculus. Go to the Rheumatology activity and complete the homunculus joint exam.  Investigation: No additional findings.  Imaging: MM 3D SCREEN BREAST BILATERAL  Result Date: 05/14/2020 CLINICAL DATA:  Screening. EXAM: DIGITAL SCREENING BILATERAL MAMMOGRAM WITH TOMO AND CAD COMPARISON:  Previous exam(s). ACR Breast Density Category c: The breast tissue is heterogeneously dense, which may obscure small masses. FINDINGS: There are no findings suspicious for malignancy. Images were processed with CAD. IMPRESSION: No mammographic evidence of malignancy. A result letter of this screening mammogram will be mailed directly to the patient. RECOMMENDATION: Screening  mammogram in one year. (Code:SM-B-01Y) BI-RADS CATEGORY  1: Negative. Electronically Signed   By: Ammie Ferrier M.D.   On: 05/14/2020 11:32    Recent Labs: Lab Results  Component Value Date   WBC 4.8 12/05/2019   HGB 14.3 12/05/2019   PLT 220 12/05/2019   NA 142 12/05/2019   K 4.7 12/05/2019   CL 104 12/05/2019   CO2 29 12/05/2019   GLUCOSE 93 12/05/2019   BUN 22 12/05/2019   CREATININE 0.77 12/05/2019   BILITOT 0.7 12/05/2019   ALKPHOS 85 10/27/2016   AST 14 12/05/2019   ALT 13 12/05/2019   PROT 6.7 12/05/2019   ALBUMIN 4.1 10/27/2016   CALCIUM 9.4 12/05/2019   GFRAA 91 12/05/2019    Speciality Comments: No specialty comments available.  Procedures:  Large Joint Inj: bilateral greater trochanter on 05/19/2020 1:27 PM Indications: pain Details: 27 G 1.5 in needle,  lateral approach  Arthrogram: No  Medications (Right): 1.5 mL lidocaine 1 %; 40 mg triamcinolone acetonide 40 MG/ML Aspirate (Right): 0 mL Medications (Left): 1.5 mL lidocaine 1 %; 40 mg triamcinolone acetonide 40 MG/ML Aspirate (Left): 0 mL Outcome: tolerated well, no immediate complications Procedure, treatment alternatives, risks and benefits explained, specific risks discussed. Consent was given by the patient. Immediately prior to procedure a time out was called to verify the correct patient, procedure, equipment, support staff and site/side marked as required. Patient was prepped and draped in the usual sterile fashion.     Allergies: Divalproex sodium and Valproic acid   Assessment / Plan:     Visit Diagnoses: Fibromyalgia: She has generalized hyperalgesia and positive tender points on exam.  She had a fibromyalgia flare about 2 weeks ago and was experiencing significant myalgias and fatigue.  She has been having more frequent flares recently.  She presents today with trochanteric bursitis bilaterally.  She has tenderness palpation on exam.  She requested bilateral trochanteric bursa cortisone injections.  She was also given a handout of exercises to perform.  We discussed the importance of regular exercise and good sleep hygiene.  She will follow-up in the office in 6 months.  History of fatigue: Chronic but stable.  She was encouraged to continue to walk for exercise on a daily basis.  History of insomnia: She has occasional interruptions in sleep due to nocturnal pain.  She takes melatonin 5 mg 1 tablet by mouth at bedtime to help her sleep.  She is also using a CPAP.  Primary osteoarthritis of both hands: She has PIP and DIP thickening consistent with osteoarthritis of both hands.  No tenderness or inflammation was noted on exam.  She is able to make a complete fist bilaterally.  We discussed the importance of joint protection and muscle strengthening.  She was given a handout of  hand exercises to perform.  She can continue to use Voltaren gel topically as needed for pain relief.  A refill of Voltaren gel be sent to the pharmacy.  Primary osteoarthritis of both knees: She has good range of motion of both knee joints on exam.  No warmth or effusion was noted.  She uses Voltaren gel topically as needed for pain relief  Trochanteric bursitis of both hips: She presents today with trochanter bursitis bilaterally.  She has tenderness palpation on exam.  Her discomfort is exacerbated by laying on her sides at night as well as walking for long distances.  She requested trochanteric bursa cortisone injections.  She tolerated procedures well.  Aftercare was discussed.  She was given a handout of exercises to perform.  She was advised to notify us if her discomfort persists or worsens.  Cervical spondylosis without myelopathy: She has limited range of motion with lateral rotation.  Trapezius muscle tension and muscle tenderness bilaterally.  She was encouraged to perform stretching exercises on a daily basis.  Vitamin D deficiency: She is taking vitamin D 2000 units daily.  DEXA on 11/09/2014 was within normal limits.  She is overdue to update her bone density and was advised to reach out to her PCP to schedule this.  She will have the results sent to [place] for [place] to review with her.  She was encouraged to continue to take a vitamin D supplement.   History of migraine: Followed by Dr. Myles Rosenthal   History of sleep apnea: Uses CPAP  History of depression  Orders: Orders Placed This Encounter  Procedures  . Large Joint Inj   No orders of the defined types were placed in this encounter.     Follow-Up Instructions: Return in about 6 months (around 11/16/2020) for Fibromyalgia, Osteoarthritis.   Ofilia Neas, PA-C  Note - This record has been created using Dragon software.  Chart creation errors have been sought, but may not always  have been located. Such creation errors do not  reflect on  the standard of medical care.

## 2020-05-12 ENCOUNTER — Other Ambulatory Visit: Payer: Self-pay

## 2020-05-12 ENCOUNTER — Ambulatory Visit
Admission: RE | Admit: 2020-05-12 | Discharge: 2020-05-12 | Disposition: A | Discharge status: Home / Self Care | Payer: Medicare Other | Source: Ambulatory Visit | Attending: Internal Medicine | Admitting: Internal Medicine

## 2020-05-12 DIAGNOSIS — Z1231 Encounter for screening mammogram for malignant neoplasm of breast: Secondary | ICD-10-CM

## 2020-05-19 ENCOUNTER — Other Ambulatory Visit: Payer: Self-pay

## 2020-05-19 ENCOUNTER — Encounter: Payer: Self-pay | Admitting: Physician Assistant

## 2020-05-19 ENCOUNTER — Ambulatory Visit (INDEPENDENT_AMBULATORY_CARE_PROVIDER_SITE_OTHER): Payer: Medicare Other | Admitting: Physician Assistant

## 2020-05-19 VITALS — BP 117/73 | HR 75 | Resp 15 | Ht 66.0 in | Wt 185.0 lb

## 2020-05-19 DIAGNOSIS — M7062 Trochanteric bursitis, left hip: Secondary | ICD-10-CM

## 2020-05-19 DIAGNOSIS — M170 Bilateral primary osteoarthritis of knee: Secondary | ICD-10-CM | POA: Diagnosis not present

## 2020-05-19 DIAGNOSIS — M19041 Primary osteoarthritis, right hand: Secondary | ICD-10-CM | POA: Diagnosis not present

## 2020-05-19 DIAGNOSIS — M7061 Trochanteric bursitis, right hip: Secondary | ICD-10-CM | POA: Diagnosis not present

## 2020-05-19 DIAGNOSIS — M797 Fibromyalgia: Secondary | ICD-10-CM | POA: Diagnosis not present

## 2020-05-19 DIAGNOSIS — Z87898 Personal history of other specified conditions: Secondary | ICD-10-CM

## 2020-05-19 DIAGNOSIS — M47812 Spondylosis without myelopathy or radiculopathy, cervical region: Secondary | ICD-10-CM

## 2020-05-19 DIAGNOSIS — Z8659 Personal history of other mental and behavioral disorders: Secondary | ICD-10-CM | POA: Diagnosis not present

## 2020-05-19 DIAGNOSIS — E559 Vitamin D deficiency, unspecified: Secondary | ICD-10-CM

## 2020-05-19 DIAGNOSIS — M19042 Primary osteoarthritis, left hand: Secondary | ICD-10-CM | POA: Diagnosis not present

## 2020-05-19 DIAGNOSIS — Z8669 Personal history of other diseases of the nervous system and sense organs: Secondary | ICD-10-CM

## 2020-05-19 MED ORDER — DICLOFENAC SODIUM 1 % EX GEL: CUTANEOUS | 2 refills | Status: DC

## 2020-05-19 MED ORDER — TRIAMCINOLONE ACETONIDE 40 MG/ML IJ SUSP
40.0000 mg | INTRAMUSCULAR | Status: AC | PRN
  Administered 2020-05-19: 40 mg via INTRA_ARTICULAR

## 2020-05-19 MED ORDER — LIDOCAINE HCL 1 % IJ SOLN
1.5000 mL | INTRAMUSCULAR | Status: AC | PRN
  Administered 2020-05-19: 1.5 mL

## 2020-05-19 NOTE — Patient Instructions (Signed)
Hand Exercises Hand exercises can be helpful for almost anyone. These exercises can strengthen the hands, improve flexibility and movement, and increase blood flow to the hands. These results can make work and daily tasks easier. Hand exercises can be especially helpful for people who have joint pain from arthritis or have nerve damage from overuse (carpal tunnel syndrome). These exercises can also help people who have injured a hand. Exercises Most of these hand exercises are gentle stretching and motion exercises. It is usually safe to do them often throughout the day. Warming up your hands before exercise may help to reduce stiffness. You can do this with gentle massage or by placing your hands in warm water for 10-15 minutes. It is normal to feel some stretching, pulling, tightness, or mild discomfort as you begin new exercises. This will gradually improve. Stop an exercise right away if you feel sudden, severe pain or your pain gets worse. Ask your health care provider which exercises are best for you. Knuckle bend or "claw" fist 1. Stand or sit with your arm, hand, and all five fingers pointed straight up. Make sure to keep your wrist straight during the exercise. 2. Gently bend your fingers down toward your palm until the tips of your fingers are touching the top of your palm. Keep your big knuckle straight and just bend the small knuckles in your fingers. 3. Hold this position for __________ seconds. 4. Straighten (extend) your fingers back to the starting position. Repeat this exercise 5-10 times with each hand. Full finger fist 1. Stand or sit with your arm, hand, and all five fingers pointed straight up. Make sure to keep your wrist straight during the exercise. 2. Gently bend your fingers into your palm until the tips of your fingers are touching the middle of your palm. 3. Hold this position for __________ seconds. 4. Extend your fingers back to the starting position, stretching every  joint fully. Repeat this exercise 5-10 times with each hand. Straight fist 1. Stand or sit with your arm, hand, and all five fingers pointed straight up. Make sure to keep your wrist straight during the exercise. 2. Gently bend your fingers at the big knuckle, where your fingers meet your hand, and the middle knuckle. Keep the knuckle at the tips of your fingers straight and try to touch the bottom of your palm. 3. Hold this position for __________ seconds. 4. Extend your fingers back to the starting position, stretching every joint fully. Repeat this exercise 5-10 times with each hand. Tabletop 1. Stand or sit with your arm, hand, and all five fingers pointed straight up. Make sure to keep your wrist straight during the exercise. 2. Gently bend your fingers at the big knuckle, where your fingers meet your hand, as far down as you can while keeping the small knuckles in your fingers straight. Think of forming a tabletop with your fingers. 3. Hold this position for __________ seconds. 4. Extend your fingers back to the starting position, stretching every joint fully. Repeat this exercise 5-10 times with each hand. Finger spread 1. Place your hand flat on a table with your palm facing down. Make sure your wrist stays straight as you do this exercise. 2. Spread your fingers and thumb apart from each other as far as you can until you feel a gentle stretch. Hold this position for __________ seconds. 3. Bring your fingers and thumb tight together again. Hold this position for __________ seconds. Repeat this exercise 5-10 times with each hand.   Making circles 1. Stand or sit with your arm, hand, and all five fingers pointed straight up. Make sure to keep your wrist straight during the exercise. 2. Make a circle by touching the tip of your thumb to the tip of your index finger. 3. Hold for __________ seconds. Then open your hand wide. 4. Repeat this motion with your thumb and each finger on your  hand. Repeat this exercise 5-10 times with each hand. Thumb motion 1. Sit with your forearm resting on a table and your wrist straight. Your thumb should be facing up toward the ceiling. Keep your fingers relaxed as you move your thumb. 2. Lift your thumb up as high as you can toward the ceiling. Hold for __________ seconds. 3. Bend your thumb across your palm as far as you can, reaching the tip of your thumb for the small finger (pinkie) side of your palm. Hold for __________ seconds. Repeat this exercise 5-10 times with each hand. Grip strengthening  1. Hold a stress ball or other soft ball in the middle of your hand. 2. Slowly increase the pressure, squeezing the ball as much as you can without causing pain. Think of bringing the tips of your fingers into the middle of your palm. All of your finger joints should bend when doing this exercise. 3. Hold your squeeze for __________ seconds, then relax. Repeat this exercise 5-10 times with each hand. Contact a health care provider if:  Your hand pain or discomfort gets much worse when you do an exercise.  Your hand pain or discomfort does not improve within 2 hours after you exercise. If you have any of these problems, stop doing these exercises right away. Do not do them again unless your health care provider says that you can. Get help right away if:  You develop sudden, severe hand pain or swelling. If this happens, stop doing these exercises right away. Do not do them again unless your health care provider says that you can. This information is not intended to replace advice given to you by your health care provider. Make sure you discuss any questions you have with your health care provider. Document Revised: 12/05/2018 Document Reviewed: 08/15/2018 Elsevier Patient Education  2020 [place]. Hip Bursitis Rehab Ask your health care provider which exercises are safe for you. Do exercises exactly as told by your health care provider  and adjust them as directed. It is normal to feel mild stretching, pulling, tightness, or discomfort as you do these exercises. Stop right away if you feel sudden pain or your pain gets worse. Do not begin these exercises until told by your health care provider. Stretching exercise This exercise warms up your muscles and joints and improves the movement and flexibility of your hip. This exercise also helps to relieve pain and stiffness. Iliotibial band stretch An iliotibial band is a strong band of muscle tissue that runs from the outer side of your hip to the outer side of your thigh and knee. 1. Lie on your side with your left / right leg in the top position. 2. Bend your left / right knee and grab your ankle. Stretch out your bottom arm to help you balance. 3. Slowly bring your knee back so your thigh is behind your body. 4. Slowly lower your knee toward the floor until you feel a gentle stretch on the outside of your left / right thigh. If you do not feel a stretch and your knee will not fall farther, place the  heel of your other foot on top of your knee and pull your knee down toward the floor with your foot. 5. Hold this position for __________ seconds. 6. Slowly return to the starting position. Repeat __________ times. Complete this exercise __________ times a day. Strengthening exercises These exercises build strength and endurance in your hip and pelvis. Endurance is the ability to use your muscles for a long time, even after they get tired. Bridge This exercise strengthens the muscles that move your thigh backward (hip extensors). 1. Lie on your back on a firm surface with your knees bent and your feet flat on the floor. 2. Tighten your buttocks muscles and lift your buttocks off the floor until your trunk is level with your thighs. ? Do not arch your back. ? You should feel the muscles working in your buttocks and the back of your thighs. If you do not feel these muscles, slide your  feet 1-2 inches (2.5-5 cm) farther away from your buttocks. ? If this exercise is too easy, try doing it with your arms crossed over your chest. 3. Hold this position for __________ seconds. 4. Slowly lower your hips to the starting position. 5. Let your muscles relax completely after each repetition. Repeat __________ times. Complete this exercise __________ times a day. Squats This exercise strengthens the muscles in front of your thigh and knee (quadriceps). 1. Stand in front of a table, with your feet and knees pointing straight ahead. You may rest your hands on the table for balance but not for support. 2. Slowly bend your knees and lower your hips like you are going to sit in a chair. ? Keep your weight over your heels, not over your toes. ? Keep your lower legs upright so they are parallel with the table legs. ? Do not let your hips go lower than your knees. ? Do not bend lower than told by your health care provider. ? If your hip pain increases, do not bend as low. 3. Hold the squat position for __________ seconds. 4. Slowly push with your legs to return to standing. Do not use your hands to pull yourself to standing. Repeat __________ times. Complete this exercise __________ times a day. Hip hike 1. Stand sideways on a bottom step. Stand on your left / right leg with your other foot unsupported next to the step. You can hold on to the railing or wall for balance if needed. 2. Keep your knees straight and your torso square. Then lift your left / right hip up toward the ceiling. 3. Hold this position for __________ seconds. 4. Slowly let your left / right hip lower toward the floor, past the starting position. Your foot should get closer to the floor. Do not lean or bend your knees. Repeat __________ times. Complete this exercise __________ times a day. Single leg stand 1. Without shoes, stand near a railing or in a doorway. You may hold on to the railing or door frame as needed for  balance. 2. Squeeze your left / right buttock muscles, then lift up your other foot. ? Do not let your left / right hip push out to the side. ? It is helpful to stand in front of a mirror for this exercise so you can watch your hip. 3. Hold this position for __________ seconds. Repeat __________ times. Complete this exercise __________ times a day. This information is not intended to replace advice given to you by your health care provider. Make sure you discuss  any questions you have with your health care provider. Document Revised: 12/09/2018 Document Reviewed: 12/09/2018 Elsevier Patient Education  [place].

## 2020-05-24 DIAGNOSIS — Z23 Encounter for immunization: Secondary | ICD-10-CM | POA: Diagnosis not present

## 2020-05-31 DIAGNOSIS — G43109 Migraine with aura, not intractable, without status migrainosus: Secondary | ICD-10-CM | POA: Diagnosis not present

## 2020-05-31 DIAGNOSIS — G894 Chronic pain syndrome: Secondary | ICD-10-CM | POA: Diagnosis not present

## 2020-05-31 DIAGNOSIS — Z79891 Long term (current) use of opiate analgesic: Secondary | ICD-10-CM | POA: Diagnosis not present

## 2020-05-31 DIAGNOSIS — M47812 Spondylosis without myelopathy or radiculopathy, cervical region: Secondary | ICD-10-CM | POA: Diagnosis not present

## 2020-07-29 DIAGNOSIS — G894 Chronic pain syndrome: Secondary | ICD-10-CM | POA: Diagnosis not present

## 2020-07-29 DIAGNOSIS — Z79891 Long term (current) use of opiate analgesic: Secondary | ICD-10-CM | POA: Diagnosis not present

## 2020-07-29 DIAGNOSIS — G43109 Migraine with aura, not intractable, without status migrainosus: Secondary | ICD-10-CM | POA: Diagnosis not present

## 2020-07-29 DIAGNOSIS — M47812 Spondylosis without myelopathy or radiculopathy, cervical region: Secondary | ICD-10-CM | POA: Diagnosis not present

## 2020-09-01 ENCOUNTER — Ambulatory Visit: Payer: Medicare Other | Admitting: Internal Medicine

## 2020-09-16 DIAGNOSIS — Z23 Encounter for immunization: Secondary | ICD-10-CM | POA: Diagnosis not present

## 2020-09-28 DIAGNOSIS — G43109 Migraine with aura, not intractable, without status migrainosus: Secondary | ICD-10-CM | POA: Diagnosis not present

## 2020-09-28 DIAGNOSIS — G894 Chronic pain syndrome: Secondary | ICD-10-CM | POA: Diagnosis not present

## 2020-09-28 DIAGNOSIS — Z79891 Long term (current) use of opiate analgesic: Secondary | ICD-10-CM | POA: Diagnosis not present

## 2020-09-28 DIAGNOSIS — M47812 Spondylosis without myelopathy or radiculopathy, cervical region: Secondary | ICD-10-CM | POA: Diagnosis not present

## 2020-11-04 NOTE — Progress Notes (Signed)
Office Visit Note  Patient: Kari Flynn             Date of Birth: 1951-05-03           MRN: [id number]             PCP: Elby Showers, MD Referring: Elby Showers, MD Visit Date: 11/17/2020 Occupation: '@GUAROCC' @  Subjective:  Trochanteric bursitis of both hips   History of Present Illness: Kari Flynn is a 70 y.o. female with history of fibromyalgia and osteoarthritis.  She presents today with discomfort due to trochanter bursitis of both hips. She is not experiencing any groin pain.  She had bilateral greater trochanteric bursa cortisone injections performed on 05/19/2020 which provided significant pain relief for about 4 months.  She has increased her walking regimen which she feels has exacerbated her discomfort.  She was trying to walk 4 days a week but has only been able to walk 2 days a week.  She requested repeat cortisone injections today.  She uses OTC lidocaine patches as needed for pain relief.  She continues to experience stiffness in her back, both hands, and both knee joints.  She denies any joint swelling.  She continues to have chronic lower back pain.  She continues to take methadone as prescribed for pain relief.    Activities of Daily Living:  Patient reports morning stiffness for 2 hours.   Patient Denies nocturnal pain.  Difficulty dressing/grooming: Denies Difficulty climbing stairs: Reports Difficulty getting out of chair: Reports Difficulty using hands for taps, buttons, cutlery, and/or writing: Denies  Review of Systems  Constitutional: Positive for fatigue.  HENT: Negative for mouth sores, mouth dryness and nose dryness.   Eyes: Positive for dryness. Negative for pain and itching.  Respiratory: Negative for shortness of breath and difficulty breathing.   Cardiovascular: Negative for chest pain and palpitations.  Gastrointestinal: Negative for blood in stool, constipation and diarrhea.  Endocrine: Negative for increased urination.  Genitourinary:  Negative for difficulty urinating.  Musculoskeletal: Positive for arthralgias, joint pain, myalgias, morning stiffness, muscle tenderness and myalgias. Negative for joint swelling.  Skin: Negative for color change, rash and redness.  Allergic/Immunologic: Negative for susceptible to infections.  Neurological: Positive for headaches. Negative for dizziness, numbness, memory loss and weakness.  Hematological: Positive for bruising/bleeding tendency.  Psychiatric/Behavioral: Negative for confusion.    PMFS History:  Patient Active Problem List   Diagnosis Date Noted  . Cervical spondylosis without myelopathy 01/12/2016  . Primary osteoarthritis of first carpometacarpal joint of left hand 01/12/2016  . Osteoarthritis of both knees 03/22/2014  . Unspecified constipation 10/16/2013  . Insomnia 06/23/2010  . Obstructive sleep apnea 06/25/2008  . MIGRAINE HEADACHE 06/25/2008  . Fibromyalgia 06/25/2008  . Shortness of breath 06/25/2008    Past Medical History:  Diagnosis Date  . Arthritis   . Dyspnea   . Lump of breast, left 11/08/2016   scheduled for a diagnostic/US mammo bilateral and stereo, US aspiration and ductogram.   . Migraine, unspecified, without mention of intractable migraine without mention of status migrainosus   . Myalgia and myositis, unspecified   . Obstructive sleep apnea (adult) (pediatric)     Family History  Problem Relation Age of Onset  . Hypertension Mother   . Arthritis Mother   . Hyperlipidemia Mother   . Melanoma Mother   . Diverticulitis Mother   . Multiple myeloma Father   . Arthritis Other        sibling  . Sarcoidosis  Other        sibling-also has NIDDM  . Diabetes Brother   . Sarcoidosis Brother   . Heart attack Brother   . Chronic Renal Failure Brother   . Diabetes Paternal Grandfather   . Colon cancer Neg Hx   . Esophageal cancer Neg Hx   . Rectal cancer Neg Hx   . Stomach cancer Neg Hx    Past Surgical History:  Procedure Laterality  Date  . BLEPHAROPLASTY    . BREAST BIOPSY     B9  . BUNIONECTOMY     x2  . CATARACT EXTRACTION, BILATERAL     01/2020, 02/2020  . WISDOM TOOTH EXTRACTION     Social History   Social History Narrative  . Not on file   Immunization History  Administered Date(s) Administered  . Fluad Quad(high Dose 65+) 06/19/2019  . Influenza Split 06/09/2011, 08/01/2012, 05/28/2017  . Influenza Whole 06/24/2009, 06/07/2010  . Influenza, High Dose Seasonal PF 07/13/2014, 07/06/2015, 05/31/2017, 07/05/2018  . Influenza,inj,Quad PF,6+ Mos 08/01/2013  . Influenza-Unspecified 07/25/2014, 07/06/2015, 06/06/2016  . PFIZER(Purple Top)SARS-COV-2 Vaccination 10/06/2019, 10/30/2019, 05/12/2020  . Pneumococcal Conjugate-13 11/05/2014  . Pneumococcal Polysaccharide-23 11/05/2017  . Tdap 06/09/2011     Objective: Vital Signs: BP 136/79 (BP Location: Left Arm, Patient Position: Sitting, Cuff Size: Normal)   Pulse 96   Resp 14   Ht '5\' 6"'  (1.676 m)   Wt 178 lb 9.6 oz (81 kg)   BMI 28.83 kg/m    Physical Exam Vitals and nursing note reviewed.  Constitutional:      Appearance: She is well-developed.  HENT:     Head: Normocephalic and atraumatic.  Eyes:     Conjunctiva/sclera: Conjunctivae normal.  Pulmonary:     Effort: Pulmonary effort is normal.  Abdominal:     Palpations: Abdomen is soft.  Musculoskeletal:     Cervical back: Normal range of motion.  Skin:    General: Skin is warm and dry.     Capillary Refill: Capillary refill takes less than 2 seconds.  Neurological:     Mental Status: She is alert and oriented to person, place, and time.  Psychiatric:        Behavior: Behavior normal.      Musculoskeletal Exam: Generalized hyperalgesia and positive tender points.  C-spine limited ROM with lateral rotation.  Trapezius muscle tension and tenderness bilaterally.  Shoulder joints, elbow joints, wrist joints, MCPs, PIPs, and DIPs good ROM with no synovitis.  Complete fist formation  bilaterally.  PIP and DIP thickening consistent with osteoarthritis of both hands.  Synovial thickening of the right 2nd MCP joint.  Hip joints, knee joints, and ankle joints good ROM with no discomfort. Tenderness over bilateral trochanteric bursa.  No warmth or effusion of knee joints.  No tenderness or swelling of ankle joints.   CDAI Exam: CDAI Score: -- Patient Global: --; Provider Global: -- Swollen: --; Tender: -- Joint Exam 11/17/2020   No joint exam has been documented for this visit   There is currently no information documented on the homunculus. Go to the Rheumatology activity and complete the homunculus joint exam.  Investigation: No additional findings.  Imaging: No results found.  Recent Labs: Lab Results  Component Value Date   WBC 4.8 12/05/2019   HGB 14.3 12/05/2019   PLT 220 12/05/2019   NA 142 12/05/2019   K 4.7 12/05/2019   CL 104 12/05/2019   CO2 29 12/05/2019   GLUCOSE 93 12/05/2019   BUN 22 12/05/2019  CREATININE 0.77 12/05/2019   BILITOT 0.7 12/05/2019   ALKPHOS 85 10/27/2016   AST 14 12/05/2019   ALT 13 12/05/2019   PROT 6.7 12/05/2019   ALBUMIN 4.1 10/27/2016   CALCIUM 9.4 12/05/2019   GFRAA 91 12/05/2019    Speciality Comments: No specialty comments available.  Procedures:  Large Joint Inj: bilateral greater trochanter on 11/17/2020 1:56 PM Indications: pain Details: 27 G 1.5 in needle, lateral approach  Arthrogram: No  Medications (Right): 1.5 mL lidocaine 1 %; 40 mg triamcinolone acetonide 40 MG/ML Aspirate (Right): 0 mL Medications (Left): 1.5 mL lidocaine 1 %; 40 mg triamcinolone acetonide 40 MG/ML Aspirate (Left): 0 mL Outcome: tolerated well, no immediate complications Procedure, treatment alternatives, risks and benefits explained, specific risks discussed. Consent was given by the patient. Immediately prior to procedure a time out was called to verify the correct patient, procedure, equipment, support staff and site/side  marked as required. Patient was prepped and draped in the usual sterile fashion.     Allergies: Divalproex sodium and Valproic acid   Assessment / Plan:     Visit Diagnoses: Fibromyalgia: She has generalized hyperalgesia and positive tender points on exam.  She experiences generalized myalgias and muscle tenderness intermittently.  She has ongoing trapezius muscle tension and muscle tenderness bilaterally.  She presents today with trochanter bursitis of both hips.  She has tried to increase her walking regimen to 4 days a week which has exacerbated her discomfort due to trochanter bursitis.  Bilateral cortisone injections were performed today.  We discussed the importance of regular exercise and good sleep hygiene.  She will follow-up in the office in 6 months.  History of insomnia: Discussed good sleep hygiene.   History of fatigue: Chronic and secondary to insomnia.  We discussed the importance of regular exercise.  Primary osteoarthritis of both hands: She has PIP and DIP thickening consistent with osteoarthritis of both hands.  Synovial thickening of the right second MCP joint noted.  No inflammation was noted on exam.  We discussed the importance of joint protection and muscle strengthening.  Primary osteoarthritis of both knees: She is not experiencing any pain in her knee joints at this time.  She experiences occasional stiffness after sitting for prolonged periods of time.  She has been walking several days a week for exercise.  She uses Voltaren gel topically as needed for pain relief.  Trochanteric bursitis of both hips: She presents today with trochanter bursitis of both hips.  She had bilateral trochanteric bursa cortisone injections performed on 05/19/2020 which provided significant relief for about 4 months.  She has increased her exercise regimen by walking 4 days a week which has exacerbated her discomfort.  She is not experiencing any groin pain at this time.  She has tried using  over-the-counter Lidoderm patches for pain relief but has not noticed much improvement.  She requested bilateral cortisone injections today.  She tolerated procedure well.  Procedure note was completed above.  Aftercare was discussed.  She was given a handout of exercises to perform.  Cervical spondylosis without myelopathy: She has chronic pain and stiffness in her C-spine.  She is not experiencing any symptoms of radiculopathy at this time.  She has ongoing trapezius muscle tension and muscle tenderness.  Vitamin D deficiency:  She is taking vitamin D 2,000 units daily.   Other medical conditions are listed as follows:   History of migraine - Followed by Dr. Myles Rosenthal   History of depression  History of sleep apnea -  Uses CPAP  Orders: Orders Placed This Encounter  Procedures  . Large Joint Inj   No orders of the defined types were placed in this encounter.     Follow-Up Instructions: Return in about 6 months (around 05/20/2021) for Fibromyalgia, Osteoarthritis.   Ofilia Neas, PA-C  Note - This record has been created using Dragon software.  Chart creation errors have been sought, but may not always  have been located. Such creation errors do not reflect on  the standard of medical care.

## 2020-11-17 ENCOUNTER — Ambulatory Visit (INDEPENDENT_AMBULATORY_CARE_PROVIDER_SITE_OTHER): Payer: Medicare Other | Admitting: Physician Assistant

## 2020-11-17 ENCOUNTER — Encounter: Payer: Self-pay | Admitting: Physician Assistant

## 2020-11-17 ENCOUNTER — Other Ambulatory Visit: Payer: Self-pay

## 2020-11-17 VITALS — BP 136/79 | HR 96 | Resp 14 | Ht 66.0 in | Wt 178.6 lb

## 2020-11-17 DIAGNOSIS — Z8659 Personal history of other mental and behavioral disorders: Secondary | ICD-10-CM | POA: Diagnosis not present

## 2020-11-17 DIAGNOSIS — M7061 Trochanteric bursitis, right hip: Secondary | ICD-10-CM | POA: Diagnosis not present

## 2020-11-17 DIAGNOSIS — M7062 Trochanteric bursitis, left hip: Secondary | ICD-10-CM

## 2020-11-17 DIAGNOSIS — Z8669 Personal history of other diseases of the nervous system and sense organs: Secondary | ICD-10-CM

## 2020-11-17 DIAGNOSIS — M47812 Spondylosis without myelopathy or radiculopathy, cervical region: Secondary | ICD-10-CM | POA: Diagnosis not present

## 2020-11-17 DIAGNOSIS — E559 Vitamin D deficiency, unspecified: Secondary | ICD-10-CM | POA: Diagnosis not present

## 2020-11-17 DIAGNOSIS — Z87898 Personal history of other specified conditions: Secondary | ICD-10-CM | POA: Diagnosis not present

## 2020-11-17 DIAGNOSIS — M170 Bilateral primary osteoarthritis of knee: Secondary | ICD-10-CM | POA: Diagnosis not present

## 2020-11-17 DIAGNOSIS — M19042 Primary osteoarthritis, left hand: Secondary | ICD-10-CM

## 2020-11-17 DIAGNOSIS — M19041 Primary osteoarthritis, right hand: Secondary | ICD-10-CM | POA: Diagnosis not present

## 2020-11-17 DIAGNOSIS — M797 Fibromyalgia: Secondary | ICD-10-CM | POA: Diagnosis not present

## 2020-11-17 MED ORDER — TRIAMCINOLONE ACETONIDE 40 MG/ML IJ SUSP
40.0000 mg | INTRAMUSCULAR | Status: AC | PRN
  Administered 2020-11-17: 40 mg via INTRA_ARTICULAR

## 2020-11-17 MED ORDER — LIDOCAINE HCL 1 % IJ SOLN
1.5000 mL | INTRAMUSCULAR | Status: AC | PRN
  Administered 2020-11-17: 1.5 mL

## 2020-11-17 NOTE — Patient Instructions (Signed)
Hip Bursitis Rehab Ask your health care provider which exercises are safe for you. Do exercises exactly as told by your health care provider and adjust them as directed. It is normal to feel mild stretching, pulling, tightness, or discomfort as you do these exercises. Stop right away if you feel sudden pain or your pain gets worse. Do not begin these exercises until told by your health care provider. Stretching exercise This exercise warms up your muscles and joints and improves the movement and flexibility of your hip. This exercise also helps to relieve pain and stiffness. Iliotibial band stretch An iliotibial band is a strong band of muscle tissue that runs from the outer side of your hip to the outer side of your thigh and knee. 1. Lie on your side with your left / right leg in the top position. 2. Bend your left / right knee and grab your ankle. Stretch out your bottom arm to help you balance. 3. Slowly bring your knee back so your thigh is behind your body. 4. Slowly lower your knee toward the floor until you feel a gentle stretch on the outside of your left / right thigh. If you do not feel a stretch and your knee will not fall farther, place the heel of your other foot on top of your knee and pull your knee down toward the floor with your foot. 5. Hold this position for __________ seconds. 6. Slowly return to the starting position. Repeat __________ times. Complete this exercise __________ times a day.   Strengthening exercises These exercises build strength and endurance in your hip and pelvis. Endurance is the ability to use your muscles for a long time, even after they get tired. Bridge This exercise strengthens the muscles that move your thigh backward (hip extensors). 1. Lie on your back on a firm surface with your knees bent and your feet flat on the floor. 2. Tighten your buttocks muscles and lift your buttocks off the floor until your trunk is level with your thighs. ? Do not arch  your back. ? You should feel the muscles working in your buttocks and the back of your thighs. If you do not feel these muscles, slide your feet 1-2 inches (2.5-5 cm) farther away from your buttocks. ? If this exercise is too easy, try doing it with your arms crossed over your chest. 3. Hold this position for __________ seconds. 4. Slowly lower your hips to the starting position. 5. Let your muscles relax completely after each repetition. Repeat __________ times. Complete this exercise __________ times a day.   Squats This exercise strengthens the muscles in front of your thigh and knee (quadriceps). 1. Stand in front of a table, with your feet and knees pointing straight ahead. You may rest your hands on the table for balance but not for support. 2. Slowly bend your knees and lower your hips like you are going to sit in a chair. ? Keep your weight over your heels, not over your toes. ? Keep your lower legs upright so they are parallel with the table legs. ? Do not let your hips go lower than your knees. ? Do not bend lower than told by your health care provider. ? If your hip pain increases, do not bend as low. 3. Hold the squat position for __________ seconds. 4. Slowly push with your legs to return to standing. Do not use your hands to pull yourself to standing. Repeat __________ times. Complete this exercise __________ times a day.  Hip hike 1. Stand sideways on a bottom step. Stand on your left / right leg with your other foot unsupported next to the step. You can hold on to the railing or wall for balance if needed. 2. Keep your knees straight and your torso square. Then lift your left / right hip up toward the ceiling. 3. Hold this position for __________ seconds. 4. Slowly let your left / right hip lower toward the floor, past the starting position. Your foot should get closer to the floor. Do not lean or bend your knees. Repeat __________ times. Complete this exercise __________ times  a day. Single leg stand 1. Without shoes, stand near a railing or in a doorway. You may hold on to the railing or door frame as needed for balance. 2. Squeeze your left / right buttock muscles, then lift up your other foot. ? Do not let your left / right hip push out to the side. ? It is helpful to stand in front of a mirror for this exercise so you can watch your hip. 3. Hold this position for __________ seconds. Repeat __________ times. Complete this exercise __________ times a day. This information is not intended to replace advice given to you by your health care provider. Make sure you discuss any questions you have with your health care provider. Document Revised: 12/09/2018 Document Reviewed: 12/09/2018 Elsevier Patient Education  [place].

## 2020-11-23 ENCOUNTER — Other Ambulatory Visit: Payer: Self-pay | Admitting: Internal Medicine

## 2020-11-24 NOTE — Telephone Encounter (Signed)
Modafinil refill e-sent to [facility]

## 2020-11-30 DIAGNOSIS — M47812 Spondylosis without myelopathy or radiculopathy, cervical region: Secondary | ICD-10-CM | POA: Diagnosis not present

## 2020-11-30 DIAGNOSIS — G894 Chronic pain syndrome: Secondary | ICD-10-CM | POA: Diagnosis not present

## 2020-11-30 DIAGNOSIS — Z79891 Long term (current) use of opiate analgesic: Secondary | ICD-10-CM | POA: Diagnosis not present

## 2020-11-30 DIAGNOSIS — G43109 Migraine with aura, not intractable, without status migrainosus: Secondary | ICD-10-CM | POA: Diagnosis not present

## 2020-12-02 NOTE — Progress Notes (Signed)
Patient ID: Kari Flynn, female    DOB: 02-07-1951, 70 y.o.   MRN: [id number]  HPI F former smoker, followed for OSA, Insomnia, complicated by hx of fibromyalgia and migraine.  NPSG 01/10/11-AHI 27.7/hour, desaturation to 86% ------------------------------------------------------------   12/04/19- 70 year old female former smoker followed for OSA, insomnia, complicated by history fibromyalgia, migraine CPAP 5-20/[facility]  She had reported more HA and fatigue in the month after getting replacement CPAP machine in Feb. Download AHI/ was 6. We changed her to auto 5-20. Now no longer getting HA and says she's doing ok.  Download compliance 97%, AHI 6.7/ hr Body weight today 181 lbs Comes today per insurance requirement that she be seen following replacement of cpap. Modafinil 200 mg still helpful for EDS. Occ naps.   12/03/20- 70 year old female former smoker followed for OSA, insomnia, complicated by history fibromyalgia, migraine CPAP 5-20/[facility]        Replaced CPAP Feb, 2021 AirSense 10 AutoSet Download-compliance 100%, AHI 4.7/ hr Body weight today-173 lbs Covid vax-3 phizer Flu vax-had -----Pt states she has been waking up with a headache at least 3 days a week and states that she does wear her CPAP every night.  Has been waking in AM with frontal HA about 3x/ week for several months. Nasal congestion as she lies down. Asks to try heated hose.  ROS-see HPI + = positive Constitutional:   No-   weight loss, night sweats, fevers, chills, +fatigue, lassitude. HEENT:   + headaches, difficulty swallowing, tooth/dental problems, sore throat,       No-  sneezing, itching, ear ache, +nasal congestion, post nasal drip,  CV:  No-   chest pain, orthopnea, PND, swelling in lower extremities, anasarca,  dizziness, palpitations Resp: No- acute  shortness of breath with exertion or at rest.              No-   productive cough,  No non-productive cough,  No- coughing up of blood.              No-    change in color of mucus.  No- wheezing.   Skin: No-   rash or lesions. GI:  No-   heartburn, indigestion, abdominal pain, nausea, vomiting,  GU:  MS:  No-   joint pain or swelling.   Neuro-     nothing unusual Psych:  No- change in mood or affect. No depression or anxiety.  No memory loss.   Objective:   Physical Exam General- Alert, Oriented, Affect-normal, Distress- none acute; medium build Skin- rash-none, lesions- none, excoriation- none Lymphadenopathy- none Head- atraumatic            Eyes- Gross vision intact, PERRLA, conjunctivae clear secretions; +periorbital puffy+ mild            Ears- Hearing, canals-normal            Nose- Clear, no-Septal dev, mucus, polyps, erosion, perforation             Throat- Mallampati III-IV , mucosa clear , drainage- none, tonsils- atrophic Neck- flexible , trachea midline, no stridor , thyroid nl, carotid no bruit Chest - symmetrical excursion , unlabored           Heart/CV- RRR , no murmur , no gallop  , no rub, nl s1 s2                           - JVD- none , edema- none, stasis changes-  none, varices- none           Lung- clear to P&A, wheeze- none, cough- none , dullness-none, rub- none           Chest wall-  Abd-  Br/ Gen/ Rectal- Not done, not indicated Extrem- cyanosis- none, clubbing, none, atrophy- none, strength- nl Neuro- grossly intact to observation

## 2020-12-03 ENCOUNTER — Other Ambulatory Visit: Payer: Self-pay

## 2020-12-03 ENCOUNTER — Encounter: Payer: Self-pay | Admitting: Internal Medicine

## 2020-12-03 ENCOUNTER — Ambulatory Visit (INDEPENDENT_AMBULATORY_CARE_PROVIDER_SITE_OTHER): Payer: Medicare Other | Admitting: Internal Medicine

## 2020-12-03 VITALS — BP 122/70 | HR 84 | Temp 98.1°F | Ht 66.0 in | Wt 173.2 lb

## 2020-12-03 DIAGNOSIS — G4733 Obstructive sleep apnea (adult) (pediatric): Secondary | ICD-10-CM

## 2020-12-03 DIAGNOSIS — G43809 Other migraine, not intractable, without status migrainosus: Secondary | ICD-10-CM

## 2020-12-03 NOTE — Patient Instructions (Signed)
Order- DME [facility]   Please change to heated tubing. Continue CPAP auto 5-20, mask of choice,  Supplies.  Suggest- OTC Flonase nasal spray (Ok generic fluticasone)   2 sprays each nostril every night. Give it a week or so to see if it begins to help nasal congestion.  Ok to use Sudafed as directed.  Please let me know if you don't get better.

## 2020-12-06 ENCOUNTER — Other Ambulatory Visit: Payer: Self-pay

## 2020-12-06 ENCOUNTER — Other Ambulatory Visit: Payer: Medicare Other | Admitting: Internal Medicine

## 2020-12-06 DIAGNOSIS — K5903 Drug induced constipation: Secondary | ICD-10-CM

## 2020-12-06 DIAGNOSIS — M797 Fibromyalgia: Secondary | ICD-10-CM | POA: Diagnosis not present

## 2020-12-06 DIAGNOSIS — T402X5A Adverse effect of other opioids, initial encounter: Secondary | ICD-10-CM | POA: Diagnosis not present

## 2020-12-06 DIAGNOSIS — G4733 Obstructive sleep apnea (adult) (pediatric): Secondary | ICD-10-CM | POA: Diagnosis not present

## 2020-12-06 DIAGNOSIS — Z13220 Encounter for screening for lipoid disorders: Secondary | ICD-10-CM | POA: Diagnosis not present

## 2020-12-06 DIAGNOSIS — Z0000 Encounter for general adult medical examination without abnormal findings: Secondary | ICD-10-CM

## 2020-12-06 DIAGNOSIS — M7062 Trochanteric bursitis, left hip: Secondary | ICD-10-CM | POA: Diagnosis not present

## 2020-12-06 DIAGNOSIS — M7061 Trochanteric bursitis, right hip: Secondary | ICD-10-CM | POA: Diagnosis not present

## 2020-12-06 DIAGNOSIS — M19041 Primary osteoarthritis, right hand: Secondary | ICD-10-CM | POA: Diagnosis not present

## 2020-12-06 DIAGNOSIS — M170 Bilateral primary osteoarthritis of knee: Secondary | ICD-10-CM

## 2020-12-06 DIAGNOSIS — M19042 Primary osteoarthritis, left hand: Secondary | ICD-10-CM | POA: Diagnosis not present

## 2020-12-07 ENCOUNTER — Other Ambulatory Visit: Payer: Medicare Other | Admitting: Internal Medicine

## 2020-12-07 LAB — COMPLETE METABOLIC PANEL WITH GFR
AG Ratio: 2 (calc) (ref 1.0–2.5)
ALT: 18 U/L (ref 6–29)
AST: 18 U/L (ref 10–35)
Albumin: 4.3 g/dL (ref 3.6–5.1)
Alkaline phosphatase (APISO): 86 U/L (ref 37–153)
BUN: 21 mg/dL (ref 7–25)
CO2: 29 mmol/L (ref 20–32)
Calcium: 9.7 mg/dL (ref 8.6–10.4)
Chloride: 104 mmol/L (ref 98–110)
Creat: 0.73 mg/dL (ref 0.60–0.93)
GFR, Est African American: 96 mL/min/{1.73_m2} (ref >=60)
GFR, Est Non African American: 83 mL/min/{1.73_m2} (ref >=60)
Globulin: 2.2 g/dL (calc) (ref 1.9–3.7)
Glucose, Bld: 88 mg/dL (ref 65–99)
Potassium: 4 mmol/L (ref 3.5–5.3)
Sodium: 141 mmol/L (ref 135–146)
Total Bilirubin: 0.9 mg/dL (ref 0.2–1.2)
Total Protein: 6.5 g/dL (ref 6.1–8.1)

## 2020-12-07 LAB — LIPID PANEL
Cholesterol: 174 mg/dL (ref <200)
HDL: 80 mg/dL (ref >=50)
LDL Cholesterol (Calc): 80 mg/dL (calc)
Non-HDL Cholesterol (Calc): 94 mg/dL (calc) (ref <130)
Total CHOL/HDL Ratio: 2.2 (calc) (ref <5.0)
Triglycerides: 55 mg/dL (ref <150)

## 2020-12-07 LAB — CBC WITH DIFFERENTIAL/PLATELET
Absolute Monocytes: 440 cells/uL (ref 200–950)
Basophils Absolute: 22 cells/uL (ref 0–200)
Basophils Relative: 0.5 %
Eosinophils Absolute: 48 cells/uL (ref 15–500)
Eosinophils Relative: 1.1 %
HCT: 43.3 % (ref 35.0–45.0)
Hemoglobin: 14.1 g/dL (ref 11.7–15.5)
Lymphs Abs: 981 cells/uL (ref 850–3900)
MCH: 29.7 pg (ref 27.0–33.0)
MCHC: 32.6 g/dL (ref 32.0–36.0)
MCV: 91.2 fL (ref 80.0–100.0)
MPV: 10.4 fL (ref 7.5–12.5)
Monocytes Relative: 10 %
Neutro Abs: 2908 cells/uL (ref 1500–7800)
Neutrophils Relative %: 66.1 %
Platelets: 209 10*3/uL (ref 140–400)
RBC: 4.75 10*6/uL (ref 3.80–5.10)
RDW: 13 % (ref 11.0–15.0)
Total Lymphocyte: 22.3 %
WBC: 4.4 10*3/uL (ref 3.8–10.8)

## 2020-12-07 LAB — TSH: TSH: 2.15 mIU/L (ref 0.40–4.50)

## 2020-12-16 ENCOUNTER — Other Ambulatory Visit: Payer: Self-pay

## 2020-12-16 ENCOUNTER — Ambulatory Visit (INDEPENDENT_AMBULATORY_CARE_PROVIDER_SITE_OTHER): Payer: Medicare Other | Admitting: Internal Medicine

## 2020-12-16 ENCOUNTER — Encounter: Payer: Self-pay | Admitting: Internal Medicine

## 2020-12-16 VITALS — BP 140/80 | HR 60 | Ht 65.0 in | Wt 174.0 lb

## 2020-12-16 DIAGNOSIS — Z8669 Personal history of other diseases of the nervous system and sense organs: Secondary | ICD-10-CM

## 2020-12-16 DIAGNOSIS — Z1231 Encounter for screening mammogram for malignant neoplasm of breast: Secondary | ICD-10-CM

## 2020-12-16 DIAGNOSIS — K5903 Drug induced constipation: Secondary | ICD-10-CM

## 2020-12-16 DIAGNOSIS — G250 Essential tremor: Secondary | ICD-10-CM

## 2020-12-16 DIAGNOSIS — M797 Fibromyalgia: Secondary | ICD-10-CM

## 2020-12-16 DIAGNOSIS — Z23 Encounter for immunization: Secondary | ICD-10-CM

## 2020-12-16 DIAGNOSIS — T402X5A Adverse effect of other opioids, initial encounter: Secondary | ICD-10-CM

## 2020-12-16 DIAGNOSIS — S61219A Laceration without foreign body of unspecified finger without damage to nail, initial encounter: Secondary | ICD-10-CM | POA: Diagnosis not present

## 2020-12-16 DIAGNOSIS — E2839 Other primary ovarian failure: Secondary | ICD-10-CM | POA: Diagnosis not present

## 2020-12-16 DIAGNOSIS — Z0000 Encounter for general adult medical examination without abnormal findings: Secondary | ICD-10-CM | POA: Diagnosis not present

## 2020-12-16 DIAGNOSIS — M8580 Other specified disorders of bone density and structure, unspecified site: Secondary | ICD-10-CM | POA: Diagnosis not present

## 2020-12-16 DIAGNOSIS — G4733 Obstructive sleep apnea (adult) (pediatric): Secondary | ICD-10-CM | POA: Diagnosis not present

## 2020-12-16 DIAGNOSIS — M170 Bilateral primary osteoarthritis of knee: Secondary | ICD-10-CM

## 2020-12-16 LAB — POCT URINALYSIS DIPSTICK
Appearance: NEGATIVE
Bilirubin, UA: NEGATIVE
Blood, UA: NEGATIVE
Glucose, UA: NEGATIVE
Ketones, UA: NEGATIVE
Leukocytes, UA: NEGATIVE
Nitrite, UA: NEGATIVE
Odor: NEGATIVE
Protein, UA: NEGATIVE
Spec Grav, UA: 1.01 (ref 1.010–1.025)
Urobilinogen, UA: 0.2 E.U./dL
pH, UA: 6.5 (ref 5.0–8.0)

## 2020-12-16 MED ORDER — HYOSCYAMINE SULFATE 0.125 MG SL SUBL: 0.1250 mg | SUBLINGUAL_TABLET | SUBLINGUAL | 2 refills | Status: DC | PRN

## 2020-12-16 NOTE — Progress Notes (Signed)
Subjective:    Patient ID: Kari Flynn, female    DOB: 1951/01/18, 70 y.o.   MRN: [id number]  HPI 70 year old Female seen for Medicare wellness, health maintenance exam and evaluation of medical issues.  She has fibromyalgia syndrome and pain in knee arthritis followed by Dr. Estanislado Pandy, Rheumatologist every 6 months gets steroid injections for trochanteric bursitis.  Longstanding history of migraine headaches treated with Frova .  Has had issues with constipation aggravated by pain medication.  History of sleep apnea and uses CPAP device.  Saw Dr. Keturah Barre recently for follow-up on sleep apnea.  Has some morning headaches upon awakening.  Patient says these are more like sinus headaches and migraine.  Still has occasional migraine headaches.  Has been using Flonase at night which was recommended by Dr. Annamaria Boots and that seems to have helped her morning headaches.  She is on Provigil per Dr. Annamaria Boots.  Is on chronic pain medication with methadone and takes 5 mg every 8 hours.  Is followed by [facility] (Dr. Nicholaus Bloom).  Saw Neurologist Dr. Rexene Alberts in May 2021 and was diagnosed with benign familial tremor.  Had colonoscopy by Dr. Delfin Edis in 10-21-2013 due to right abdominal pain with 10-year follow-up recommended.  Has had cataract extractions both eyes by Dr. Satira Sark in the  Fall of 2021.  Social history: She is married.  2 adult sons.  Does not smoke.  Formerly worked as a Writer at [employer].  Past medical history: D&C 10/21/1977, bilateral tubal ligation 1985/10/21, [place], benign right breast biopsy February 1995, infected laceration of right leg summer 2015.  Family history: Brother with history of MI, diabetes mellitus, chronic kidney disease.  Mother died 10/21/13 with congestive heart failure.  Mother had multiple myeloma, hypertension and gout.  Review of Systems history of constipation related to pain medication.  Arthralgias, joint pain,  morning stiffness.   Mammogram order for the near future.  Labs reviewed including dipstick UA, TSH, lipid panel, c-Met and CBC all of which are normal.  Small laceration left hand 5th finger palmar aspect  PIP joint one week ago- need to update Tdap today-last done 10-21-10.  No evidence of secondary infection     Objective:   Physical Exam  Blood pressure 140/80, pulse 60 regular pulse oximetry 96% weight 174 pounds height 5 feet 5 inches BMI 28.96 skin: Warm and dry.  No cervical adenopathy.  TMs clear.  Neck supple.  No thyromegaly.  No carotid bruits.  Chest clear to auscultation.  Breasts are without masses.  Cardiac exam: Regular rate and rhythm without murmurs.  Abdomen is soft nondistended without hepatosplenomegaly masses or tenderness.  Bimanual exam is normal.  No lower extremity pitting edema.  Pap does not need to be repeated due to age.  Neuro: Other than tremor has no gross focal deficits.  Affect, thought, judgment are normal.      Assessment & Plan:  Longstanding history of fibromyalgia syndrome followed by Dr. Abram Sander, rheumatologist  Primary osteoarthritis of both knees treated by Rheumatologist  Opioid-induced constipation-has chronic pain management by Dr. Nicholaus Bloom  Migraine headaches treated by chronic pain management  Finger laceration treated conservatively  History of benign positional vertigo  Diagnosed recently with familial tremor  Status post bilateral cataract extractions  10 pound weight loss over the past year-continue diet is as tolerated exercise  Plan: Continue current medications.  Monitor blood pressure which is elevated systolically today.  Try to get  some exercise if possible with fibromyalgia and musculoskeletal pain.  BMI is 28.96 and last year was 29.70.  History of sleep apnea followed by Dr. Annamaria Boots.  Mammogram order placed.  Tetanus immunization update given.  Return in 1 year or as needed.  Subjective:   Patient presents  for Medicare Annual/Subsequent preventive examination.  Review Past Medical/Family/Social: See above   Risk Factors  Current exercise habits: Light Dietary issues discussed: Low-fat low carbohydrate  Cardiac risk factors: Family history of MI in brother who had diabetes, mother with history of congestive heart failure  Depression Screen  (Note: if answer to either of the following is "Yes", a more complete depression screening is indicated)   Over the past two weeks, have you felt down, depressed or hopeless? No  Over the past two weeks, have you felt little interest or pleasure in doing things? No Have you lost interest or pleasure in daily life? No Do you often feel hopeless? No Do you cry easily over simple problems? No   Activities of Daily Living  In your present state of health, do you have any difficulty performing the following activities?:   Driving? No  Managing money? No  Feeding yourself? No  Getting from bed to chair? No  Climbing a flight of stairs? No  Preparing food and eating?: No  Bathing or showering? No  Getting dressed: No  Getting to the toilet? No  Using the toilet:No  Moving around from place to place: No  In the past year have you fallen or had a near fall?:No  Are you sexually active? No  Do you have more than one partner? No   Hearing Difficulties: No  Do you often ask people to speak up or repeat themselves? No  Do you experience ringing or noises in your ears? No  Do you have difficulty understanding soft or whispered voices? No  Do you feel that you have a problem with memory? No Do you often misplace items? No    Home Safety:  Do you have a smoke alarm at your residence? Yes Do you have grab bars in the bathroom?  No Do you have throw rugs in your house?  A few   Cognitive Testing  Alert? Yes Normal Appearance?Yes  Oriented to person? Yes Place? Yes  Time? Yes  Recall of three objects? Yes  Can perform simple calculations? Yes   Displays appropriate judgment?Yes  Can read the correct time from a watch face?Yes   List the Names of Other Physician/Practitioners you currently use:  See referral list for the physicians patient is currently seeing.  Rheumatologist  Pain management  Pulmonologist   Review of Systems: See above   Objective:     General appearance: Appears younger than stated age and mildly obese  Head: Normocephalic, without obvious abnormality, atraumatic  Eyes: conj clear, EOMi PEERLA  Ears: normal TM's and external ear canals both ears  Nose: Nares normal. Septum midline. Mucosa normal. No drainage or sinus tenderness.  Throat: lips, mucosa, and tongue normal; teeth and gums normal  Neck: no adenopathy, no carotid bruit, no JVD, supple, symmetrical, trachea midline and thyroid not enlarged, symmetric, no tenderness/mass/nodules  No CVA tenderness.  Lungs: clear to auscultation bilaterally  Breasts: normal appearance, no masses or tenderness Heart: regular rate and rhythm, S1, S2 normal, no murmur, click, rub or gallop  Abdomen: soft, non-tender; bowel sounds normal; no masses, no organomegaly  Musculoskeletal: ROM normal in all joints, no crepitus, no deformity, Normal muscle  strengthen. Back  is symmetric, no curvature. Skin: Skin color, texture, turgor normal. No rashes or lesions  Lymph nodes: Cervical, supraclavicular, and axillary nodes normal.  Neurologic: CN 2 -12 Normal, Normal symmetric reflexes. Normal coordination and gait  Psych: Alert & Oriented x 3, Mood appear stable.    Assessment:    Annual wellness medicare exam   Plan:    During the course of the visit the patient was educated and counseled about appropriate screening and preventive services including:   Tetanus immunization given  Have COVID booster later this summer  Pneumococcal immunizations up-to-date  Gets annual flu vaccine  Needs Shingrix vaccine if she has not had this elsewhere.  May get this at  pharmacy  Mammogram order placed  Colonoscopy up-to-date     Patient Instructions (the written plan) was given to the patient.  Medicare Attestation  I have personally reviewed:  The patient's medical and social history  Their use of alcohol, tobacco or illicit drugs  Their current medications and supplements  The patient's functional ability including ADLs,fall risks, home safety risks, cognitive, and hearing and visual impairment  Diet and physical activities  Evidence for depression or mood disorders  The patient's weight, height, BMI, and visual acuity have been recorded in the chart. I have made referrals, counseling, and provided education to the patient based on review of the above and I have provided the patient with a written personalized care plan for preventive services.

## 2020-12-29 DIAGNOSIS — H57813 Brow ptosis, bilateral: Secondary | ICD-10-CM | POA: Diagnosis not present

## 2020-12-29 DIAGNOSIS — H02834 Dermatochalasis of left upper eyelid: Secondary | ICD-10-CM | POA: Diagnosis not present

## 2020-12-29 DIAGNOSIS — H02831 Dermatochalasis of right upper eyelid: Secondary | ICD-10-CM | POA: Diagnosis not present

## 2020-12-29 DIAGNOSIS — H02423 Myogenic ptosis of bilateral eyelids: Secondary | ICD-10-CM | POA: Diagnosis not present

## 2021-01-18 ENCOUNTER — Encounter: Payer: Self-pay | Admitting: Internal Medicine

## 2021-01-18 NOTE — Assessment & Plan Note (Signed)
Reports missing some nights, but download records 100% compliance over last month Plan -continue auto 5-20, change to heated hose

## 2021-01-18 NOTE — Assessment & Plan Note (Signed)
Not clear that this has anything to do with CPAP.  Plan- watch for effect of better humidification with heated tube and regular use of flonase.

## 2021-01-23 NOTE — Patient Instructions (Addendum)
It was a pleasure to see you today.  Tetanus immunization update given today.  Continue current medications and follow-up in 1 year or as needed.  Monitor blood pressure.  It is elevated systolically today.  Continue diet and exercise efforts as tolerated.  Mammogram order placed.

## 2021-01-26 DIAGNOSIS — M47812 Spondylosis without myelopathy or radiculopathy, cervical region: Secondary | ICD-10-CM | POA: Diagnosis not present

## 2021-01-26 DIAGNOSIS — G43109 Migraine with aura, not intractable, without status migrainosus: Secondary | ICD-10-CM | POA: Diagnosis not present

## 2021-01-26 DIAGNOSIS — G894 Chronic pain syndrome: Secondary | ICD-10-CM | POA: Diagnosis not present

## 2021-01-26 DIAGNOSIS — Z79891 Long term (current) use of opiate analgesic: Secondary | ICD-10-CM | POA: Diagnosis not present

## 2021-03-15 DIAGNOSIS — Z23 Encounter for immunization: Secondary | ICD-10-CM | POA: Diagnosis not present

## 2021-03-29 DIAGNOSIS — Z79891 Long term (current) use of opiate analgesic: Secondary | ICD-10-CM | POA: Diagnosis not present

## 2021-03-29 DIAGNOSIS — G894 Chronic pain syndrome: Secondary | ICD-10-CM | POA: Diagnosis not present

## 2021-03-29 DIAGNOSIS — M47812 Spondylosis without myelopathy or radiculopathy, cervical region: Secondary | ICD-10-CM | POA: Diagnosis not present

## 2021-03-29 DIAGNOSIS — G43109 Migraine with aura, not intractable, without status migrainosus: Secondary | ICD-10-CM | POA: Diagnosis not present

## 2021-05-03 DIAGNOSIS — H26493 Other secondary cataract, bilateral: Secondary | ICD-10-CM | POA: Diagnosis not present

## 2021-05-03 DIAGNOSIS — H04123 Dry eye syndrome of bilateral lacrimal glands: Secondary | ICD-10-CM | POA: Diagnosis not present

## 2021-05-03 DIAGNOSIS — D3132 Benign neoplasm of left choroid: Secondary | ICD-10-CM | POA: Diagnosis not present

## 2021-05-03 DIAGNOSIS — H02403 Unspecified ptosis of bilateral eyelids: Secondary | ICD-10-CM | POA: Diagnosis not present

## 2021-05-05 NOTE — Progress Notes (Signed)
Office Visit Note  Patient: Kari Flynn             Date of Birth: [birth year]           MRN: [id number]             PCP: Elby Showers, MD Referring: Elby Showers, MD Visit Date: 05/18/2021 Occupation: '@GUAROCC' @  Subjective:  Pain in multiple joints.   History of Present Illness: Kari Flynn is a 70 y.o. female with a history of osteoarthritis and fibromyalgia syndrome.  She states she has been experiencing increased neck pain and migraine headaches.  She was seen recently by Dr. Hardin Negus and was placed on meloxicam.  She states after taking meloxicam for few days she started experiencing increased hair loss and she stopped the medication.  She continues to have pain and discomfort in her neck, hands, and knees.  She also has some trochanteric bursa discomfort off and on.  She states that the fibromyalgia symptoms flare off and on.  Insomnia is better.  Activities of Daily Living:  Patient reports morning stiffness for 2 hours.   Patient Reports nocturnal pain.  Difficulty dressing/grooming: Denies Difficulty climbing stairs: Reports Difficulty getting out of chair: Reports Difficulty using hands for taps, buttons, cutlery, and/or writing: Denies  Review of Systems  Constitutional:  Positive for fatigue.  HENT:  Negative for mouth sores, mouth dryness and nose dryness.   Eyes:  Positive for dryness. Negative for pain and itching.  Respiratory:  Negative for shortness of breath and difficulty breathing.   Cardiovascular:  Negative for chest pain and palpitations.  Gastrointestinal:  Negative for blood in stool, constipation and diarrhea.  Endocrine: Negative for increased urination.  Genitourinary:  Negative for difficulty urinating.  Musculoskeletal:  Positive for joint pain, joint pain, joint swelling, myalgias, morning stiffness, muscle tenderness and myalgias.  Skin:  Negative for color change, rash, redness and sensitivity to sunlight.  Allergic/Immunologic: Negative  for susceptible to infections.  Neurological:  Negative for dizziness, numbness, headaches and memory loss.  Hematological:  Positive for bruising/bleeding tendency.  Psychiatric/Behavioral:  Negative for depressed mood, confusion and sleep disturbance. The patient is not nervous/anxious.    PMFS History:  Patient Active Problem List   Diagnosis Date Noted   Primary osteoarthritis of both hands 05/18/2021   Cervical spondylosis without myelopathy 01/12/2016   Primary osteoarthritis of first carpometacarpal joint of left hand 01/12/2016   Osteoarthritis of both knees 03/22/2014   Unspecified constipation 10/16/2013   Insomnia 06/23/2010   Obstructive sleep apnea 06/25/2008   Migraine headache 06/25/2008   Fibromyalgia 06/25/2008   Shortness of breath 06/25/2008    Past Medical History:  Diagnosis Date   Arthritis    Dyspnea    Lump of breast, left 11/08/2016   scheduled for a diagnostic/US mammo bilateral and stereo, US aspiration and ductogram.    Migraine, unspecified, without mention of intractable migraine without mention of status migrainosus    Myalgia and myositis, unspecified    Obstructive sleep apnea (adult) (pediatric)     Family History  Problem Relation Age of Onset   Hypertension Mother    Arthritis Mother    Hyperlipidemia Mother    Melanoma Mother    Diverticulitis Mother    Multiple myeloma Father    Arthritis Other        sibling   Sarcoidosis Other        sibling-also has NIDDM   Diabetes Brother    Sarcoidosis Brother  Heart attack Brother    Chronic Renal Failure Brother    Diabetes Paternal Grandfather    Colon cancer Neg Hx    Esophageal cancer Neg Hx    Rectal cancer Neg Hx    Stomach cancer Neg Hx    Past Surgical History:  Procedure Laterality Date   BLEPHAROPLASTY     BREAST BIOPSY     B9   BUNIONECTOMY     x2   CATARACT EXTRACTION, BILATERAL     01/2020, 02/2020   WISDOM TOOTH EXTRACTION     Social History   Social History  Narrative   Not on file   Immunization History  Administered Date(s) Administered   Fluad Quad(high Dose 65+) 06/19/2019   Influenza Split 06/09/2011, 08/01/2012, 05/28/2017   Influenza Whole 06/24/2009, 06/07/2010   Influenza, High Dose Seasonal PF 07/13/2014, 07/06/2015, 05/31/2017, 07/05/2018, 07/29/2020   Influenza,inj,Quad PF,6+ Mos 08/01/2013   Influenza-Unspecified 07/25/2014, 07/06/2015, 06/06/2016   PFIZER(Purple Top)SARS-COV-2 Vaccination 10/06/2019, 10/30/2019, 05/12/2020   Pneumococcal Conjugate-13 11/05/2014   Pneumococcal Polysaccharide-23 11/05/2017   Tdap 06/09/2011, 12/16/2020     Objective: Vital Signs: BP 126/76 (BP Location: Left Arm, Patient Position: Sitting, Cuff Size: Normal)   Pulse 73   Ht '5\' 5"'  (1.651 m)   Wt 175 lb 9.6 oz (79.7 kg)   BMI 29.22 kg/m    Physical Exam Vitals and nursing note reviewed.  Constitutional:      Appearance: She is well-developed.  HENT:     Head: Normocephalic and atraumatic.  Eyes:     Conjunctiva/sclera: Conjunctivae normal.  Cardiovascular:     Rate and Rhythm: Normal rate and regular rhythm.     Heart sounds: Normal heart sounds.  Pulmonary:     Effort: Pulmonary effort is normal.     Breath sounds: Normal breath sounds.  Abdominal:     General: Bowel sounds are normal.     Palpations: Abdomen is soft.  Musculoskeletal:     Cervical back: Normal range of motion.  Lymphadenopathy:     Cervical: No cervical adenopathy.  Skin:    General: Skin is warm and dry.     Capillary Refill: Capillary refill takes less than 2 seconds.  Neurological:     Mental Status: She is alert and oriented to person, place, and time.  Psychiatric:        Behavior: Behavior normal.     Musculoskeletal Exam: She had limited rotation of her cervical spine.  Shoulder joints and elbow joints with good range of motion.  She had bilateral PIP and DIP thickening with the no synovitis.  Hip joints and knee joints with good range of motion.   She had no warmth swelling or effusion but discomfort range of motion of her knee joints.  There was no tenderness over ankles or MTPs.  She generalized hyperalgesia and positive tender points.  She had discomfort on palpation of bilateral trapezius region.  CDAI Exam: CDAI Score: -- Patient Global: --; Provider Global: -- Swollen: --; Tender: -- Joint Exam 05/18/2021   No joint exam has been documented for this visit   There is currently no information documented on the homunculus. Go to the Rheumatology activity and complete the homunculus joint exam.  Investigation: No additional findings.  Imaging: No results found.  Recent Labs: Lab Results  Component Value Date   WBC 4.4 12/06/2020   HGB 14.1 12/06/2020   PLT 209 12/06/2020   NA 141 12/06/2020   K 4.0 12/06/2020   CL 104 12/06/2020  CO2 29 12/06/2020   GLUCOSE 88 12/06/2020   BUN 21 12/06/2020   CREATININE 0.73 12/06/2020   BILITOT 0.9 12/06/2020   ALKPHOS 85 10/27/2016   AST 18 12/06/2020   ALT 18 12/06/2020   PROT 6.5 12/06/2020   ALBUMIN 4.1 10/27/2016   CALCIUM 9.7 12/06/2020   GFRAA 96 12/06/2020    Speciality Comments: No specialty comments available.  Procedures:  Trigger Point Inj  Date/Time: 05/18/2021 2:30 PM Performed by: Bo Merino, MD Authorized by: Bo Merino, MD   Consent Given by:  Patient Site marked: the procedure site was marked   Timeout: prior to procedure the correct patient, procedure, and site was verified   Indications:  Muscle spasm and pain Total # of Trigger Points:  2 Location: neck   Needle Size:  27 G Approach:  Dorsal Medications #1:  0.5 mL lidocaine 1 % Medications #2:  0.5 mL lidocaine 1 %; 10 mg triamcinolone acetonide 40 MG/ML Patient tolerance:  Patient tolerated the procedure well with no immediate complications Allergies: Divalproex sodium, Meloxicam, and Valproic acid   Assessment / Plan:     Visit Diagnoses: Primary osteoarthritis of both  hands-she continues to have pain and discomfort in her bilateral hands.  No synovitis was noted.  Joint protection muscle strengthening was discussed.  A handout on muscle strengthening exercises was given.  Trochanteric bursitis of both hips-she is off-and-on discomfort in the trochanteric region.  IT band exercises was emphasized.  Chronic pain of both knees -she has been having increased pain and discomfort in her bilateral knee joints.  She had Visco supplement injections in 2018.  She had good response to injections in the past.  I will obtain x-rays today.  Plan: XR KNEE 3 VIEW RIGHT, XR KNEE 3 VIEW LEFT.  X-ray of bilateral knee joints were consistent with bilateral moderate osteoarthritis with bilateral moderate chondromalacia patella.  I plan to start her on viscosupplementation sections once approved.  This patient is diagnosed with osteoarthritis of the knee(s).    Radiographs show evidence of joint space narrowing, osteophytes, subchondral sclerosis and/or subchondral cysts.  This patient has knee pain which interferes with functional and activities of daily living.    This patient has experienced inadequate response, adverse effects and/or intolerance with conservative treatments such as acetaminophen, NSAIDS, topical creams, physical therapy or regular exercise, knee bracing and/or weight loss.   This patient has experienced inadequate response or has a contraindication to intra articular steroid injections for at least 3 months.   This patient is not scheduled to have a total knee replacement within 6 months of starting treatment with viscosupplementation.   Primary osteoarthritis of both knees  Cervical spondylosis without myelopathy-she has limited range of motion of her cervical spine.  She continues to have pain and discomfort in her cervical region.  Trapezius muscle spasm-she has bilateral trapezius spasm.  Different treatment options were discussed.  She has tried NSAIDs  without any results.  Side effects of trigger point injections were discussed.  She wants to proceed with trigger point injections.  After informed consent was obtained bilateral trapezius region was injected with cortisone as described above.  She tolerated the procedure well.  Fibromyalgia-she continues to have some generalized pain and discomfort from fibromyalgia.  History of fatigue-related to fibromyalgia and insomnia.  History of insomnia-sleep hygiene was discussed.  Vitamin D deficiency  History of migraine - Followed by Dr. Myles Rosenthal   History of depression  History of sleep apnea - Uses CPAP  Orders: Orders Placed This Encounter  Procedures   XR KNEE 3 VIEW RIGHT   XR KNEE 3 VIEW LEFT    No orders of the defined types were placed in this encounter.    Follow-Up Instructions: Return in about 6 months (around 11/15/2021) for Osteoarthritis.   Bo Merino, MD  Note - This record has been created using Editor, commissioning.  Chart creation errors have been sought, but may not always  have been located. Such creation errors do not reflect on  the standard of medical care.

## 2021-05-16 ENCOUNTER — Ambulatory Visit
Admission: RE | Admit: 2021-05-16 | Discharge: 2021-05-16 | Disposition: A | Discharge status: Home / Self Care | Payer: Medicare Other | Source: Ambulatory Visit | Attending: Internal Medicine | Admitting: Internal Medicine

## 2021-05-16 ENCOUNTER — Other Ambulatory Visit: Payer: Self-pay

## 2021-05-16 DIAGNOSIS — Z1231 Encounter for screening mammogram for malignant neoplasm of breast: Secondary | ICD-10-CM | POA: Diagnosis not present

## 2021-05-18 ENCOUNTER — Other Ambulatory Visit: Payer: Self-pay

## 2021-05-18 ENCOUNTER — Ambulatory Visit (INDEPENDENT_AMBULATORY_CARE_PROVIDER_SITE_OTHER): Payer: Medicare Other | Admitting: Rheumatology

## 2021-05-18 ENCOUNTER — Ambulatory Visit: Payer: Self-pay

## 2021-05-18 ENCOUNTER — Telehealth: Payer: Self-pay

## 2021-05-18 ENCOUNTER — Encounter: Payer: Self-pay | Admitting: Rheumatology

## 2021-05-18 VITALS — BP 126/76 | HR 73 | Ht 65.0 in | Wt 175.6 lb

## 2021-05-18 DIAGNOSIS — Z87898 Personal history of other specified conditions: Secondary | ICD-10-CM | POA: Diagnosis not present

## 2021-05-18 DIAGNOSIS — M19042 Primary osteoarthritis, left hand: Secondary | ICD-10-CM | POA: Diagnosis not present

## 2021-05-18 DIAGNOSIS — M62838 Other muscle spasm: Secondary | ICD-10-CM

## 2021-05-18 DIAGNOSIS — Z8669 Personal history of other diseases of the nervous system and sense organs: Secondary | ICD-10-CM

## 2021-05-18 DIAGNOSIS — M19041 Primary osteoarthritis, right hand: Secondary | ICD-10-CM

## 2021-05-18 DIAGNOSIS — Z8659 Personal history of other mental and behavioral disorders: Secondary | ICD-10-CM

## 2021-05-18 DIAGNOSIS — M797 Fibromyalgia: Secondary | ICD-10-CM | POA: Diagnosis not present

## 2021-05-18 DIAGNOSIS — M7061 Trochanteric bursitis, right hip: Secondary | ICD-10-CM | POA: Diagnosis not present

## 2021-05-18 DIAGNOSIS — M7062 Trochanteric bursitis, left hip: Secondary | ICD-10-CM

## 2021-05-18 DIAGNOSIS — M25561 Pain in right knee: Secondary | ICD-10-CM | POA: Diagnosis not present

## 2021-05-18 DIAGNOSIS — M47812 Spondylosis without myelopathy or radiculopathy, cervical region: Secondary | ICD-10-CM | POA: Diagnosis not present

## 2021-05-18 DIAGNOSIS — E559 Vitamin D deficiency, unspecified: Secondary | ICD-10-CM

## 2021-05-18 DIAGNOSIS — M170 Bilateral primary osteoarthritis of knee: Secondary | ICD-10-CM | POA: Diagnosis not present

## 2021-05-18 DIAGNOSIS — G8929 Other chronic pain: Secondary | ICD-10-CM | POA: Diagnosis not present

## 2021-05-18 DIAGNOSIS — M25562 Pain in left knee: Secondary | ICD-10-CM

## 2021-05-18 MED ORDER — TRIAMCINOLONE ACETONIDE 40 MG/ML IJ SUSP
10.0000 mg | INTRAMUSCULAR | Status: AC | PRN
  Administered 2021-05-18: 10 mg via INTRAMUSCULAR

## 2021-05-18 MED ORDER — LIDOCAINE HCL 1 % IJ SOLN
0.5000 mL | INTRAMUSCULAR | Status: AC | PRN
  Administered 2021-05-18: .5 mL

## 2021-05-18 NOTE — Patient Instructions (Signed)
Knee Exercises Ask your health care provider which exercises are safe for you. Do exercises exactly as told by your health care provider and adjust them as directed. It is normal to feel mild stretching, pulling, tightness, or discomfort as you do these exercises. Stop right away if you feel sudden pain or your pain gets worse. Do not begin these exercises until told by your health care provider. Stretching and range-of-motion exercises These exercises warm up your muscles and joints and improve the movement and flexibility of your knee. These exercises also help to relieve pain and swelling. Knee extension, prone Lie on your abdomen (prone position) on a bed. Place your left / right knee just beyond the edge of the surface so your knee is not on the bed. You can put a towel under your left / right thigh just above your kneecap for comfort. Relax your leg muscles and allow gravity to straighten your knee (extension). You should feel a stretch behind your left / right knee. Hold this position for __________ seconds. Scoot up so your knee is supported between repetitions. Repeat __________ times. Complete this exercise __________ times a day. Knee flexion, active  Lie on your back with both legs straight. If this causes back discomfort, bend your left / right knee so your foot is flat on the floor. Slowly slide your left / right heel back toward your buttocks. Stop when you feel a gentle stretch in the front of your knee or thigh (flexion). Hold this position for __________ seconds. Slowly slide your left / right heel back to the starting position. Repeat __________ times. Complete this exercise __________ times a day. Quadriceps stretch, prone  Lie on your abdomen on a firm surface, such as a bed or padded floor. Bend your left / right knee and hold your ankle. If you cannot reach your ankle or pant leg, loop a belt around your foot and grab the belt instead. Gently pull your heel toward your  buttocks. Your knee should not slide out to the side. You should feel a stretch in the front of your thigh and knee (quadriceps). Hold this position for __________ seconds. Repeat __________ times. Complete this exercise __________ times a day. Hamstring, supine Lie on your back (supine position). Loop a belt or towel over the ball of your left / right foot. The ball of your foot is on the walking surface, right under your toes. Straighten your left / right knee and slowly pull on the belt to raise your leg until you feel a gentle stretch behind your knee (hamstring). Do not let your knee bend while you do this. Keep your other leg flat on the floor. Hold this position for __________ seconds. Repeat __________ times. Complete this exercise __________ times a day. Strengthening exercises These exercises build strength and endurance in your knee. Endurance is the ability to use your muscles for a long time, even after they get tired. Quadriceps, isometric This exercise stretches the muscles in front of your thigh (quadriceps) without moving your knee joint (isometric). Lie on your back with your left / right leg extended and your other knee bent. Put a rolled towel or small pillow under your knee if told by your health care provider. Slowly tense the muscles in the front of your left / right thigh. You should see your kneecap slide up toward your hip or see increased dimpling just above the knee. This motion will push the back of the knee toward the floor. For __________   seconds, hold the muscle as tight as you can without increasing your pain. Relax the muscles slowly and completely. Repeat __________ times. Complete this exercise __________ times a day. Straight leg raises This exercise stretches the muscles in front of your thigh (quadriceps) and the muscles that move your hips (hip flexors). Lie on your back with your left / right leg extended and your other knee bent. Tense the muscles in  the front of your left / right thigh. You should see your kneecap slide up or see increased dimpling just above the knee. Your thigh may even shake a bit. Keep these muscles tight as you raise your leg 4-6 inches (10-15 cm) off the floor. Do not let your knee bend. Hold this position for __________ seconds. Keep these muscles tense as you lower your leg. Relax your muscles slowly and completely after each repetition. Repeat __________ times. Complete this exercise __________ times a day. Hamstring, isometric Lie on your back on a firm surface. Bend your left / right knee about __________ degrees. Dig your left / right heel into the surface as if you are trying to pull it toward your buttocks. Tighten the muscles in the back of your thighs (hamstring) to "dig" as hard as you can without increasing any pain. Hold this position for __________ seconds. Release the tension gradually and allow your muscles to relax completely for __________ seconds after each repetition. Repeat __________ times. Complete this exercise __________ times a day. Hamstring curls If told by your health care provider, do this exercise while wearing ankle weights. Begin with __________ lb weights. Then increase the weight by 1 lb (0.5 kg) increments. Do not wear ankle weights that are more than __________ lb. Lie on your abdomen with your legs straight. Bend your left / right knee as far as you can without feeling pain. Keep your hips flat against the floor. Hold this position for __________ seconds. Slowly lower your leg to the starting position. Repeat __________ times. Complete this exercise __________ times a day. Squats This exercise strengthens the muscles in front of your thigh and knee (quadriceps). Stand in front of a table, with your feet and knees pointing straight ahead. You may rest your hands on the table for balance but not for support. Slowly bend your knees and lower your hips like you are going to sit in a  chair. Keep your weight over your heels, not over your toes. Keep your lower legs upright so they are parallel with the table legs. Do not let your hips go lower than your knees. Do not bend lower than told by your health care provider. If your knee pain increases, do not bend as low. Hold the squat position for __________ seconds. Slowly push with your legs to return to standing. Do not use your hands to pull yourself to standing. Repeat __________ times. Complete this exercise __________ times a day. Wall slides This exercise strengthens the muscles in front of your thigh and knee (quadriceps). Lean your back against a smooth wall or door, and walk your feet out 18-24 inches (46-61 cm) from it. Place your feet hip-width apart. Slowly slide down the wall or door until your knees bend __________ degrees. Keep your knees over your heels, not over your toes. Keep your knees in line with your hips. Hold this position for __________ seconds. Repeat __________ times. Complete this exercise __________ times a day. Straight leg raises This exercise strengthens the muscles that rotate the leg at the hip and   move it away from your body (hip abductors). Lie on your side with your left / right leg in the top position. Lie so your head, shoulder, knee, and hip line up. You may bend your bottom knee to help you keep your balance. Roll your hips slightly forward so your hips are stacked directly over each other and your left / right knee is facing forward. Leading with your heel, lift your top leg 4-6 inches (10-15 cm). You should feel the muscles in your outer hip lifting. Do not let your foot drift forward. Do not let your knee roll toward the ceiling. Hold this position for __________ seconds. Slowly return your leg to the starting position. Let your muscles relax completely after each repetition. Repeat __________ times. Complete this exercise __________ times a day. Straight leg raises This  exercise stretches the muscles that move your hips away from the front of the pelvis (hip extensors). Lie on your abdomen on a firm surface. You can put a pillow under your hips if that is more comfortable. Tense the muscles in your buttocks and lift your left / right leg about 4-6 inches (10-15 cm). Keep your knee straight as you lift your leg. Hold this position for __________ seconds. Slowly lower your leg to the starting position. Let your leg relax completely after each repetition. Repeat __________ times. Complete this exercise __________ times a day. This information is not intended to replace advice given to you by your health care provider. Make sure you discuss any questions you have with your health care provider. Document Revised: 06/04/2018 Document Reviewed: 06/04/2018 Elsevier Patient Education  [place] Exercises Hand exercises can be helpful for almost anyone. These exercises can strengthen the hands, improve flexibility and movement, and increase blood flow to the hands. These results can make work and daily tasks easier. Hand exercises can be especially helpful for people who have joint pain from arthritis or have nerve damage from overuse (carpal tunnel syndrome). These exercises can also help people who have injured a hand. Exercises Most of these hand exercises are gentle stretching and motion exercises. It is usually safe to do them often throughout the day. Warming up your hands before exercise may help to reduce stiffness. You can do this with gentle massage or by placing your hands in warm water for 10-15 minutes. It is normal to feel some stretching, pulling, tightness, or mild discomfort as you begin new exercises. This will gradually improve. Stop an exercise right away if you feel sudden, severe pain or your pain gets worse. Ask your health care provider which exercises are best for you. Knuckle bend or "claw" fist  Stand or sit with your arm, hand,  and all five fingers pointed straight up. Make sure to keep your wrist straight during the exercise. Gently bend your fingers down toward your palm until the tips of your fingers are touching the top of your palm. Keep your big knuckle straight and just bend the small knuckles in your fingers. Hold this position for __________ seconds. Straighten (extend) your fingers back to the starting position. Repeat this exercise 5-10 times with each hand. Full finger fist  Stand or sit with your arm, hand, and all five fingers pointed straight up. Make sure to keep your wrist straight during the exercise. Gently bend your fingers into your palm until the tips of your fingers are touching the middle of your palm. Hold this position for __________ seconds. Extend your fingers back to the  starting position, stretching every joint fully. Repeat this exercise 5-10 times with each hand. Straight fist Stand or sit with your arm, hand, and all five fingers pointed straight up. Make sure to keep your wrist straight during the exercise. Gently bend your fingers at the big knuckle, where your fingers meet your hand, and the middle knuckle. Keep the knuckle at the tips of your fingers straight and try to touch the bottom of your palm. Hold this position for __________ seconds. Extend your fingers back to the starting position, stretching every joint fully. Repeat this exercise 5-10 times with each hand. Tabletop  Stand or sit with your arm, hand, and all five fingers pointed straight up. Make sure to keep your wrist straight during the exercise. Gently bend your fingers at the big knuckle, where your fingers meet your hand, as far down as you can while keeping the small knuckles in your fingers straight. Think of forming a tabletop with your fingers. Hold this position for __________ seconds. Extend your fingers back to the starting position, stretching every joint fully. Repeat this exercise 5-10 times with each  hand. Finger spread  Place your hand flat on a table with your palm facing down. Make sure your wrist stays straight as you do this exercise. Spread your fingers and thumb apart from each other as far as you can until you feel a gentle stretch. Hold this position for __________ seconds. Bring your fingers and thumb tight together again. Hold this position for __________ seconds. Repeat this exercise 5-10 times with each hand. Making circles  Stand or sit with your arm, hand, and all five fingers pointed straight up. Make sure to keep your wrist straight during the exercise. Make a circle by touching the tip of your thumb to the tip of your index finger. Hold for __________ seconds. Then open your hand wide. Repeat this motion with your thumb and each finger on your hand. Repeat this exercise 5-10 times with each hand. Thumb motion  Sit with your forearm resting on a table and your wrist straight. Your thumb should be facing up toward the ceiling. Keep your fingers relaxed as you move your thumb. Lift your thumb up as high as you can toward the ceiling. Hold for __________ seconds. Bend your thumb across your palm as far as you can, reaching the tip of your thumb for the small finger (pinkie) side of your palm. Hold for __________ seconds. Repeat this exercise 5-10 times with each hand. Grip strengthening  Hold a stress ball or other soft ball in the middle of your hand. Slowly increase the pressure, squeezing the ball as much as you can without causing pain. Think of bringing the tips of your fingers into the middle of your palm. All of your finger joints should bend when doing this exercise. Hold your squeeze for __________ seconds, then relax. Repeat this exercise 5-10 times with each hand. Contact a health care provider if: Your hand pain or discomfort gets much worse when you do an exercise. Your hand pain or discomfort does not improve within 2 hours after you exercise. If you have  any of these problems, stop doing these exercises right away. Do not do them again unless your health care provider says that you can. Get help right away if: You develop sudden, severe hand pain or swelling. If this happens, stop doing these exercises right away. Do not do them again unless your health care provider says that you can. This information is  not intended to replace advice given to you by your health care provider. Make sure you discuss any questions you have with your health care provider. Document Revised: 12/02/2020 Document Reviewed: 12/02/2020 Elsevier Patient Education  [place].

## 2021-05-18 NOTE — Telephone Encounter (Signed)
Please apply for bilateral knee visco, per Dr. Deveshwar. Thanks!  

## 2021-05-25 NOTE — Telephone Encounter (Signed)
Please call to schedule Visco knee injections.  Authorized for Hyalgan series Bilateral knees. Buy and Bill.  Deductible does not apply. No PA is required. Insurance to cover 100% of allowable cost. No copay.

## 2021-05-30 ENCOUNTER — Ambulatory Visit (INDEPENDENT_AMBULATORY_CARE_PROVIDER_SITE_OTHER): Payer: Medicare Other | Admitting: Physician Assistant

## 2021-05-30 ENCOUNTER — Other Ambulatory Visit: Payer: Self-pay

## 2021-05-30 DIAGNOSIS — M170 Bilateral primary osteoarthritis of knee: Secondary | ICD-10-CM | POA: Diagnosis not present

## 2021-05-30 MED ORDER — LIDOCAINE HCL 1 % IJ SOLN
1.5000 mL | INTRAMUSCULAR | Status: AC | PRN
  Administered 2021-05-30: 1.5 mL via INTRA_ARTICULAR

## 2021-05-30 MED ORDER — SODIUM HYALURONATE (VISCOSUP) 20 MG/2ML IX SOSY
20.0000 mg | PREFILLED_SYRINGE | INTRA_ARTICULAR | Status: AC | PRN
  Administered 2021-05-30: 20 mg via INTRA_ARTICULAR

## 2021-05-30 NOTE — Progress Notes (Signed)
   Procedure Note  Patient: Kari Flynn             Date of Birth: November 11, 1950           MRN: [id number]             Visit Date: 05/30/2021  Procedures: Visit Diagnoses:  1. Primary osteoarthritis of both knees    Hyalgan #1 bilateral knees, B/B Large Joint Inj: bilateral knee on 05/30/2021 12:04 PM Indications: pain Details: 27 G 1.5 in needle, medial approach  Arthrogram: No  Medications (Right): 1.5 mL lidocaine 1 %; 20 mg Sodium Hyaluronate 20 MG/2ML Aspirate (Right): 0 mL Medications (Left): 1.5 mL lidocaine 1 %; 20 mg Sodium Hyaluronate 20 MG/2ML Aspirate (Left): 0 mL Outcome: tolerated well, no immediate complications Procedure, treatment alternatives, risks and benefits explained, specific risks discussed. Consent was given by the patient. Immediately prior to procedure a time out was called to verify the correct patient, procedure, equipment, support staff and site/side marked as required. Patient was prepped and draped in the usual sterile fashion.    Patient tolerated the procedure well.  Aftercare was discussed.  Hazel Sams, PA-C

## 2021-05-31 DIAGNOSIS — G894 Chronic pain syndrome: Secondary | ICD-10-CM | POA: Diagnosis not present

## 2021-05-31 DIAGNOSIS — G43109 Migraine with aura, not intractable, without status migrainosus: Secondary | ICD-10-CM | POA: Diagnosis not present

## 2021-05-31 DIAGNOSIS — M47812 Spondylosis without myelopathy or radiculopathy, cervical region: Secondary | ICD-10-CM | POA: Diagnosis not present

## 2021-05-31 DIAGNOSIS — Z79891 Long term (current) use of opiate analgesic: Secondary | ICD-10-CM | POA: Diagnosis not present

## 2021-06-06 ENCOUNTER — Ambulatory Visit (INDEPENDENT_AMBULATORY_CARE_PROVIDER_SITE_OTHER): Payer: Medicare Other | Admitting: Physician Assistant

## 2021-06-06 ENCOUNTER — Other Ambulatory Visit: Payer: Self-pay

## 2021-06-06 DIAGNOSIS — M170 Bilateral primary osteoarthritis of knee: Secondary | ICD-10-CM | POA: Diagnosis not present

## 2021-06-06 MED ORDER — LIDOCAINE HCL 1 % IJ SOLN
1.5000 mL | INTRAMUSCULAR | Status: AC | PRN
  Administered 2021-06-06: 1.5 mL via INTRA_ARTICULAR

## 2021-06-06 MED ORDER — SODIUM HYALURONATE (VISCOSUP) 20 MG/2ML IX SOSY
20.0000 mg | PREFILLED_SYRINGE | INTRA_ARTICULAR | Status: AC | PRN
  Administered 2021-06-06: 20 mg via INTRA_ARTICULAR

## 2021-06-06 NOTE — Progress Notes (Signed)
   Procedure Note  Patient: Kari Flynn             Date of Birth: 1951/08/27           MRN: [id number]             Visit Date: 06/06/2021  Procedures: Visit Diagnoses:  1. Primary osteoarthritis of both knees     Hyalgan #2 bilateral knees, B/B Large Joint Inj: bilateral knee on 06/06/2021 11:54 AM Indications: pain Details: 27 G 1.5 in needle, medial approach  Arthrogram: No  Medications (Right): 1.5 mL lidocaine 1 %; 20 mg Sodium Hyaluronate 20 MG/2ML Aspirate (Right): 0 mL Medications (Left): 1.5 mL lidocaine 1 %; 20 mg Sodium Hyaluronate 20 MG/2ML Aspirate (Left): 0 mL Outcome: tolerated well, no immediate complications Procedure, treatment alternatives, risks and benefits explained, specific risks discussed. Consent was given by the patient. Immediately prior to procedure a time out was called to verify the correct patient, procedure, equipment, support staff and site/side marked as required. Patient was prepped and draped in the usual sterile fashion.    Patient tolerated the procedure well.  Aftercare was discussed.   Hazel Sams, PA-C

## 2021-06-08 DIAGNOSIS — H57813 Brow ptosis, bilateral: Secondary | ICD-10-CM | POA: Diagnosis not present

## 2021-06-08 DIAGNOSIS — H02831 Dermatochalasis of right upper eyelid: Secondary | ICD-10-CM | POA: Diagnosis not present

## 2021-06-08 DIAGNOSIS — H02423 Myogenic ptosis of bilateral eyelids: Secondary | ICD-10-CM | POA: Diagnosis not present

## 2021-06-08 DIAGNOSIS — H02834 Dermatochalasis of left upper eyelid: Secondary | ICD-10-CM | POA: Diagnosis not present

## 2021-06-13 ENCOUNTER — Telehealth: Payer: Self-pay

## 2021-06-13 ENCOUNTER — Ambulatory Visit: Payer: Medicare Other | Admitting: Physician Assistant

## 2021-06-13 NOTE — Telephone Encounter (Signed)
Patient just wanted to let us know that she had tested positive for covid. She states that is is mild and she is treating with OTC meds. She didn't want an appt but wanted to let us know.

## 2021-06-17 ENCOUNTER — Other Ambulatory Visit: Payer: Self-pay | Admitting: Internal Medicine

## 2021-06-20 ENCOUNTER — Ambulatory Visit: Payer: Medicare Other | Admitting: Physician Assistant

## 2021-06-21 ENCOUNTER — Telehealth: Payer: Self-pay | Admitting: Internal Medicine

## 2021-06-21 ENCOUNTER — Other Ambulatory Visit (HOSPITAL_COMMUNITY): Payer: Self-pay

## 2021-06-21 ENCOUNTER — Telehealth: Payer: Self-pay | Admitting: Pharmacy Technician

## 2021-06-21 NOTE — Telephone Encounter (Signed)
Modafinil refill sent to [facility]

## 2021-06-21 NOTE — Telephone Encounter (Signed)
Received notification from [employer] regarding a prior authorization for MODAFINIL 200MG . Authorization has been APPROVED from 10.25.22 to 4.25.23.   Per test claim, copay for 30 days supply is $27.98   Authorization #  [id number]

## 2021-06-21 NOTE — Telephone Encounter (Signed)
Spoke to patient.  Patient is requesting a refill on Provigil 200mg . Last refilled 11/24/20 with 5 refills.  Last seen 11/2020 with pending OV 11/2021.  Dr. Annamaria Boots, please advise. Thanks

## 2021-06-21 NOTE — Telephone Encounter (Signed)
Patient Advocate Encounter  Received notification from [employer] that prior authorization for [facility] 200MG  is required.   PA submitted on 10.25.22 Key [id number] Status is pending   [facility] will continue to follow  Luciano Cutter, CPhT Patient Advocate Phone: [phone] Fax:  [phone]

## 2021-06-24 DIAGNOSIS — Z20822 Contact with and (suspected) exposure to covid-19: Secondary | ICD-10-CM | POA: Diagnosis not present

## 2021-06-27 ENCOUNTER — Other Ambulatory Visit: Payer: Self-pay

## 2021-06-27 ENCOUNTER — Ambulatory Visit (INDEPENDENT_AMBULATORY_CARE_PROVIDER_SITE_OTHER): Payer: Medicare Other | Admitting: Physician Assistant

## 2021-06-27 DIAGNOSIS — M170 Bilateral primary osteoarthritis of knee: Secondary | ICD-10-CM | POA: Diagnosis not present

## 2021-06-27 MED ORDER — LIDOCAINE HCL 1 % IJ SOLN
1.5000 mL | INTRAMUSCULAR | Status: AC | PRN
  Administered 2021-06-27: 1.5 mL via INTRA_ARTICULAR

## 2021-06-27 MED ORDER — SODIUM HYALURONATE (VISCOSUP) 20 MG/2ML IX SOSY
20.0000 mg | PREFILLED_SYRINGE | INTRA_ARTICULAR | Status: AC | PRN
Start: 2021-06-27 — End: 2021-06-27
  Administered 2021-06-27: 20 mg via INTRA_ARTICULAR

## 2021-06-27 NOTE — Progress Notes (Signed)
   Procedure Note  Patient: Kari Flynn             Date of Birth: [birth year]           MRN: [id number]             Visit Date: 06/27/2021  Procedures: Visit Diagnoses:  1. Primary osteoarthritis of both knees    Hyalgan #3 bilateral knees, B/B Large Joint Inj: bilateral knee on 06/27/2021 11:57 AM Indications: pain Details: 27 G 1.5 in needle, medial approach  Arthrogram: No  Medications (Right): 1.5 mL lidocaine 1 %; 20 mg Sodium Hyaluronate 20 MG/2ML Aspirate (Right): 0 mL Medications (Left): 1.5 mL lidocaine 1 %; 20 mg Sodium Hyaluronate 20 MG/2ML Aspirate (Left): 0 mL Outcome: tolerated well, no immediate complications Procedure, treatment alternatives, risks and benefits explained, specific risks discussed. Consent was given by the patient. Immediately prior to procedure a time out was called to verify the correct patient, procedure, equipment, support staff and site/side marked as required. Patient was prepped and draped in the usual sterile fashion.     Patient tolerated the procedure well.  Aftercare was discussed.  Hazel Sams, PA-C

## 2021-06-28 ENCOUNTER — Ambulatory Visit (INDEPENDENT_AMBULATORY_CARE_PROVIDER_SITE_OTHER): Payer: Medicare Other | Admitting: Internal Medicine

## 2021-06-28 VITALS — BP 134/80 | HR 84 | Temp 98.4°F | Ht 65.0 in | Wt 172.0 lb

## 2021-06-28 DIAGNOSIS — U071 COVID-19: Secondary | ICD-10-CM | POA: Diagnosis not present

## 2021-06-28 DIAGNOSIS — R0981 Nasal congestion: Secondary | ICD-10-CM

## 2021-06-28 DIAGNOSIS — J0100 Acute maxillary sinusitis, unspecified: Secondary | ICD-10-CM | POA: Diagnosis not present

## 2021-06-28 DIAGNOSIS — Z20822 Contact with and (suspected) exposure to covid-19: Secondary | ICD-10-CM | POA: Diagnosis not present

## 2021-06-28 DIAGNOSIS — R42 Dizziness and giddiness: Secondary | ICD-10-CM

## 2021-06-28 MED ORDER — METHYLPREDNISOLONE ACETATE 80 MG/ML IJ SUSP
80.0000 mg | Freq: Once | INTRAMUSCULAR | Status: AC
  Administered 2021-06-28: 80 mg via INTRAMUSCULAR

## 2021-06-28 MED ORDER — DOXYCYCLINE HYCLATE 100 MG PO TABS: 100.0000 mg | ORAL_TABLET | Freq: Two times a day (BID) | ORAL | 0 refills | Status: DC

## 2021-06-28 MED ORDER — TERCONAZOLE 0.8 % VA CREA: 1.0000 | TOPICAL_CREAM | Freq: Every day | VAGINAL | 0 refills | Status: DC

## 2021-06-28 NOTE — Telephone Encounter (Signed)
scheduled

## 2021-06-28 NOTE — Telephone Encounter (Signed)
Patient is calling back today, she had tested positive for COVID on 06/13/2021, she still has post nasal drip and is dizzy, BP 142/90, pulse 90, O2 98 possible ear ache, she says it pops a lot, she tested negative for COVID on 06/23/2021, she would like to come in to be seen today.

## 2021-07-04 ENCOUNTER — Other Ambulatory Visit: Payer: Self-pay

## 2021-07-04 ENCOUNTER — Ambulatory Visit (INDEPENDENT_AMBULATORY_CARE_PROVIDER_SITE_OTHER): Payer: Medicare Other | Admitting: Physician Assistant

## 2021-07-04 DIAGNOSIS — M170 Bilateral primary osteoarthritis of knee: Secondary | ICD-10-CM

## 2021-07-04 MED ORDER — SODIUM HYALURONATE (VISCOSUP) 20 MG/2ML IX SOSY
20.0000 mg | PREFILLED_SYRINGE | INTRA_ARTICULAR | Status: AC | PRN
  Administered 2021-07-04: 20 mg via INTRA_ARTICULAR

## 2021-07-04 MED ORDER — LIDOCAINE HCL 1 % IJ SOLN
1.5000 mL | INTRAMUSCULAR | Status: AC | PRN
  Administered 2021-07-04: 1.5 mL via INTRA_ARTICULAR

## 2021-07-04 NOTE — Progress Notes (Signed)
   Procedure Note  Patient: Kari Flynn             Date of Birth: 01-22-1951           MRN: [id number]             Visit Date: 07/04/2021  Procedures: Visit Diagnoses:  1. Primary osteoarthritis of both knees    HYALGAN #4 BILATERAL KNEES, B/B Large Joint Inj: bilateral knee on 07/04/2021 11:35 AM Indications: pain Details: 27 G 1.5 in needle, medial approach  Arthrogram: No  Medications (Right): 1.5 mL lidocaine 1 %; 20 mg Sodium Hyaluronate 20 MG/2ML Aspirate (Right): 0 mL Medications (Left): 1.5 mL lidocaine 1 %; 20 mg Sodium Hyaluronate 20 MG/2ML Aspirate (Left): 0 mL Outcome: tolerated well, no immediate complications Procedure, treatment alternatives, risks and benefits explained, specific risks discussed. Consent was given by the patient. Immediately prior to procedure a time out was called to verify the correct patient, procedure, equipment, support staff and site/side marked as required. Patient was prepped and draped in the usual sterile fashion.    Patient tolerated the procedure well.  Aftercare was discussed.  Hazel Sams, PA-C

## 2021-07-11 ENCOUNTER — Other Ambulatory Visit: Payer: Self-pay

## 2021-07-11 ENCOUNTER — Ambulatory Visit (INDEPENDENT_AMBULATORY_CARE_PROVIDER_SITE_OTHER): Payer: Medicare Other | Admitting: Physician Assistant

## 2021-07-11 DIAGNOSIS — M170 Bilateral primary osteoarthritis of knee: Secondary | ICD-10-CM | POA: Diagnosis not present

## 2021-07-11 MED ORDER — LIDOCAINE HCL 1 % IJ SOLN
1.5000 mL | INTRAMUSCULAR | Status: AC | PRN
  Administered 2021-07-11: 1.5 mL via INTRA_ARTICULAR

## 2021-07-11 MED ORDER — SODIUM HYALURONATE (VISCOSUP) 20 MG/2ML IX SOSY
20.0000 mg | PREFILLED_SYRINGE | INTRA_ARTICULAR | Status: AC | PRN
Start: 2021-07-11 — End: 2021-07-11
  Administered 2021-07-11: 20 mg via INTRA_ARTICULAR

## 2021-07-11 MED ORDER — SODIUM HYALURONATE (VISCOSUP) 20 MG/2ML IX SOSY
20.0000 mg | PREFILLED_SYRINGE | INTRA_ARTICULAR | Status: AC | PRN
  Administered 2021-07-11: 20 mg via INTRA_ARTICULAR

## 2021-07-11 MED ORDER — LIDOCAINE HCL 1 % IJ SOLN
1.5000 mL | INTRAMUSCULAR | Status: AC | PRN
Start: 2021-07-11 — End: 2021-07-11
  Administered 2021-07-11: 1.5 mL via INTRA_ARTICULAR

## 2021-07-11 NOTE — Progress Notes (Signed)
   Procedure Note  Patient: Kari Flynn             Date of Birth: July 25, 1951           MRN: [id number]             Visit Date: 07/11/2021  Procedures: Visit Diagnoses:  1. Primary osteoarthritis of both knees     Hyalgan #5 bilateral knees, B/B Large Joint Inj: bilateral knee on 07/11/2021 11:41 AM Indications: pain Details: 27 G 1.5 in needle, medial approach  Arthrogram: No  Medications (Right): 1.5 mL lidocaine 1 %; 20 mg Sodium Hyaluronate 20 MG/2ML Aspirate (Right): 0 mL Medications (Left): 1.5 mL lidocaine 1 %; 20 mg Sodium Hyaluronate 20 MG/2ML Aspirate (Left): 0 mL Outcome: tolerated well, no immediate complications Procedure, treatment alternatives, risks and benefits explained, specific risks discussed. Consent was given by the patient. Immediately prior to procedure a time out was called to verify the correct patient, procedure, equipment, support staff and site/side marked as required. Patient was prepped and draped in the usual sterile fashion.   Patient tolerated the procedure well.  Aftercare was discussed.  Hazel Sams, PA-C

## 2021-07-25 DIAGNOSIS — Z23 Encounter for immunization: Secondary | ICD-10-CM | POA: Diagnosis not present

## 2021-07-27 NOTE — Progress Notes (Signed)
   Subjective:    Patient ID: Kari Flynn, female    DOB: [birth year], 70 y.o.   MRN: [id number]  HPI 70 year old Female with history of fibromyalgia syndrome.  She tested positive for COVID-19 on October 17.  Still has postnasal drip.  Has had some mild dizziness.  O2 sat normal at 98%.  Some ear discomfort with popping noted.  She tested negative for COVID on October 27.  She is requesting office visit and is seen here today in office.  She is seen for chronic pain management per Dr. Nicholaus Bloom.  Dr. Estanislado Pandy sees her for fibromyalgia and osteoarthritis.  She has a history of migraine headaches.  She has obstructive sleep apnea.  She has CPAP device.  History of benign hand tremor left greater than right. She has issues with constipation aggravated by her pain medication.   Review of Systems has maxillary sinus pressure and congestion.  No fever or chills.  She has had some mild dizziness, postnasal drip, fatigue cough and headache.     Objective:   Physical Exam Blood pressure 134/80 pulse 84 temperature 98.4 degrees pulse oximetry 96% BMI 28.62 weight 172 pounds  She looks fatigued and slightly pale.  TMs slightly dull.  Pharynx very slightly injected.  Neck is supple.  No cervical adenopathy.  Chest is clear to auscultation without rales or wheezing.       Assessment & Plan:  Recent history of COVID-19  Acute maxillary sinusitis  Mild vertigo  Cough  Plan: She was given prescription for doxycycline 100 mg twice daily for 10 days.  I have also prescribed Terazol 7 vaginal cream to use at bedtime if she develops Candida vaginitis while on antibiotics.  Rest and drink plenty of fluids.  Call if not improved in 7 to 10 days or sooner if worse.  Depo-Medrol 80 mg IM given in office today

## 2021-07-28 DIAGNOSIS — G894 Chronic pain syndrome: Secondary | ICD-10-CM | POA: Diagnosis not present

## 2021-07-28 DIAGNOSIS — Z20822 Contact with and (suspected) exposure to covid-19: Secondary | ICD-10-CM | POA: Diagnosis not present

## 2021-07-28 DIAGNOSIS — G43109 Migraine with aura, not intractable, without status migrainosus: Secondary | ICD-10-CM | POA: Diagnosis not present

## 2021-07-28 DIAGNOSIS — Z79891 Long term (current) use of opiate analgesic: Secondary | ICD-10-CM | POA: Diagnosis not present

## 2021-07-28 DIAGNOSIS — M47812 Spondylosis without myelopathy or radiculopathy, cervical region: Secondary | ICD-10-CM | POA: Diagnosis not present

## 2021-08-27 ENCOUNTER — Encounter: Payer: Self-pay | Admitting: Internal Medicine

## 2021-08-27 NOTE — Patient Instructions (Addendum)
Take doxycycline 100 mg twice daily for 10 days.  May use Terazol 7 vaginal cream if needed for Candida vaginitis while on antibiotics.  Rest and drink plenty of fluids.  Call if not improved in 7 to 10 days or sooner if worse.  We are sorry you are not feeling well.  Depo-Medrol 80 mg IM given

## 2021-08-28 DIAGNOSIS — Z20822 Contact with and (suspected) exposure to covid-19: Secondary | ICD-10-CM | POA: Diagnosis not present

## 2021-09-01 ENCOUNTER — Other Ambulatory Visit: Payer: Self-pay

## 2021-09-01 ENCOUNTER — Ambulatory Visit
Admission: RE | Admit: 2021-09-01 | Discharge: 2021-09-01 | Disposition: A | Discharge status: Home / Self Care | Payer: Medicare Other | Source: Ambulatory Visit | Attending: Internal Medicine | Admitting: Internal Medicine

## 2021-09-01 DIAGNOSIS — Z780 Asymptomatic menopausal state: Secondary | ICD-10-CM | POA: Diagnosis not present

## 2021-09-01 DIAGNOSIS — M8589 Other specified disorders of bone density and structure, multiple sites: Secondary | ICD-10-CM | POA: Diagnosis not present

## 2021-09-01 NOTE — Progress Notes (Signed)
Please notify patient that DEXA scan is consistent with osteopenia.  Advise resistive exercises, total calcium intake of 1200 mg p.o. daily (dietary and supplement) with vitamin D.  Repeat DEXA scan in 2 years.  We will discuss results at the follow-up visit.

## 2021-09-28 DIAGNOSIS — G43109 Migraine with aura, not intractable, without status migrainosus: Secondary | ICD-10-CM | POA: Diagnosis not present

## 2021-09-28 DIAGNOSIS — Z79891 Long term (current) use of opiate analgesic: Secondary | ICD-10-CM | POA: Diagnosis not present

## 2021-09-28 DIAGNOSIS — M47812 Spondylosis without myelopathy or radiculopathy, cervical region: Secondary | ICD-10-CM | POA: Diagnosis not present

## 2021-09-28 DIAGNOSIS — G894 Chronic pain syndrome: Secondary | ICD-10-CM | POA: Diagnosis not present

## 2021-11-02 NOTE — Progress Notes (Unsigned)
Office Visit Note  Patient: Kari Flynn             Date of Birth: 08/23/1951           MRN: [id number]             PCP: Elby Showers, MD Referring: Elby Showers, MD Visit Date: 11/16/2021 Occupation: '@GUAROCC'$ @  Subjective:  No chief complaint on file.   History of Present Illness: Kari Flynn is a 71 y.o. female ***   Activities of Daily Living:  Patient reports morning stiffness for *** {minute/hour:19697}.   Patient {ACTIONS;DENIES/REPORTS:21021675::"Denies"} nocturnal pain.  Difficulty dressing/grooming: {ACTIONS;DENIES/REPORTS:21021675::"Denies"} Difficulty climbing stairs: {ACTIONS;DENIES/REPORTS:21021675::"Denies"} Difficulty getting out of chair: {ACTIONS;DENIES/REPORTS:21021675::"Denies"} Difficulty using hands for taps, buttons, cutlery, and/or writing: {ACTIONS;DENIES/REPORTS:21021675::"Denies"}  No Rheumatology ROS completed.   PMFS History:  Patient Active Problem List   Diagnosis Date Noted   Primary osteoarthritis of both hands 05/18/2021   Cervical spondylosis without myelopathy 01/12/2016   Primary osteoarthritis of first carpometacarpal joint of left hand 01/12/2016   Osteoarthritis of both knees 03/22/2014   Unspecified constipation 10/16/2013   Insomnia 06/23/2010   Obstructive sleep apnea 06/25/2008   Migraine headache 06/25/2008   Fibromyalgia 06/25/2008   Shortness of breath 06/25/2008    Past Medical History:  Diagnosis Date   Arthritis    Dyspnea    Lump of breast, left 11/08/2016   scheduled for a diagnostic/US mammo bilateral and stereo, US aspiration and ductogram.    Migraine, unspecified, without mention of intractable migraine without mention of status migrainosus    Myalgia and myositis, unspecified    Obstructive sleep apnea (adult) (pediatric)     Family History  Problem Relation Age of Onset   Hypertension Mother    Arthritis Mother    Hyperlipidemia Mother    Melanoma Mother    Diverticulitis Mother    Multiple  myeloma Father    Arthritis Other        sibling   Sarcoidosis Other        sibling-also has NIDDM   Diabetes Brother    Sarcoidosis Brother    Heart attack Brother    Chronic Renal Failure Brother    Diabetes Paternal Grandfather    Colon cancer Neg Hx    Esophageal cancer Neg Hx    Rectal cancer Neg Hx    Stomach cancer Neg Hx    Past Surgical History:  Procedure Laterality Date   BLEPHAROPLASTY     BREAST BIOPSY     B9   BUNIONECTOMY     x2   CATARACT EXTRACTION, BILATERAL     01/2020, 02/2020   WISDOM TOOTH EXTRACTION     Social History   Social History Narrative   Not on file   Immunization History  Administered Date(s) Administered   Fluad Quad(high Dose 65+) 06/19/2019   Influenza Split 06/09/2011, 08/01/2012, 05/28/2017   Influenza Whole 06/24/2009, 06/07/2010   Influenza, High Dose Seasonal PF 07/13/2014, 07/06/2015, 05/31/2017, 07/05/2018, 07/29/2020   Influenza,inj,Quad PF,6+ Mos 08/01/2013   Influenza-Unspecified 07/25/2014, 07/06/2015, 06/06/2016   PFIZER(Purple Top)SARS-COV-2 Vaccination 10/06/2019, 10/30/2019, 05/12/2020   Pneumococcal Conjugate-13 11/05/2014   Pneumococcal Polysaccharide-23 11/05/2017   Tdap 06/09/2011, 12/16/2020     Objective: Vital Signs: There were no vitals taken for this visit.   Physical Exam   Musculoskeletal Exam: ***  CDAI Exam: CDAI Score: -- Patient Global: --; Provider Global: -- Swollen: --; Tender: -- Joint Exam 11/16/2021   No joint exam has been documented for this  visit   There is currently no information documented on the homunculus. Go to the Rheumatology activity and complete the homunculus joint exam.  Investigation: No additional findings.  Imaging: No results found.  Recent Labs: Lab Results  Component Value Date   WBC 4.4 12/06/2020   HGB 14.1 12/06/2020   PLT 209 12/06/2020   NA 141 12/06/2020   K 4.0 12/06/2020   CL 104 12/06/2020   CO2 29 12/06/2020   GLUCOSE 88 12/06/2020   BUN  21 12/06/2020   CREATININE 0.73 12/06/2020   BILITOT 0.9 12/06/2020   ALKPHOS 85 10/27/2016   AST 18 12/06/2020   ALT 18 12/06/2020   PROT 6.5 12/06/2020   ALBUMIN 4.1 10/27/2016   CALCIUM 9.7 12/06/2020   GFRAA 96 12/06/2020    Speciality Comments: No specialty comments available.  Procedures:  No procedures performed Allergies: Divalproex sodium, Meloxicam, and Valproic acid   Assessment / Plan:     Visit Diagnoses: No diagnosis found.  Orders: No orders of the defined types were placed in this encounter.  No orders of the defined types were placed in this encounter.   Face-to-face time spent with patient was *** minutes. Greater than 50% of time was spent in counseling and coordination of care.  Follow-Up Instructions: No follow-ups on file.   Earnestine Mealing, CMA  Note - This record has been created using Editor, commissioning.  Chart creation errors have been sought, but may not always  have been located. Such creation errors do not reflect on  the standard of medical care.

## 2021-11-16 ENCOUNTER — Encounter: Payer: Self-pay | Admitting: Physician Assistant

## 2021-11-16 ENCOUNTER — Other Ambulatory Visit: Payer: Self-pay

## 2021-11-16 ENCOUNTER — Ambulatory Visit (INDEPENDENT_AMBULATORY_CARE_PROVIDER_SITE_OTHER): Payer: Medicare Other | Admitting: Physician Assistant

## 2021-11-16 VITALS — BP 155/75 | HR 71 | Ht 65.0 in | Wt 172.4 lb

## 2021-11-16 DIAGNOSIS — Z8659 Personal history of other mental and behavioral disorders: Secondary | ICD-10-CM | POA: Diagnosis not present

## 2021-11-16 DIAGNOSIS — E559 Vitamin D deficiency, unspecified: Secondary | ICD-10-CM | POA: Diagnosis not present

## 2021-11-16 DIAGNOSIS — M797 Fibromyalgia: Secondary | ICD-10-CM | POA: Diagnosis not present

## 2021-11-16 DIAGNOSIS — Z8669 Personal history of other diseases of the nervous system and sense organs: Secondary | ICD-10-CM | POA: Diagnosis not present

## 2021-11-16 DIAGNOSIS — M7061 Trochanteric bursitis, right hip: Secondary | ICD-10-CM | POA: Diagnosis not present

## 2021-11-16 DIAGNOSIS — M19041 Primary osteoarthritis, right hand: Secondary | ICD-10-CM

## 2021-11-16 DIAGNOSIS — M170 Bilateral primary osteoarthritis of knee: Secondary | ICD-10-CM | POA: Diagnosis not present

## 2021-11-16 DIAGNOSIS — M8589 Other specified disorders of bone density and structure, multiple sites: Secondary | ICD-10-CM | POA: Diagnosis not present

## 2021-11-16 DIAGNOSIS — M62838 Other muscle spasm: Secondary | ICD-10-CM

## 2021-11-16 DIAGNOSIS — M47812 Spondylosis without myelopathy or radiculopathy, cervical region: Secondary | ICD-10-CM | POA: Diagnosis not present

## 2021-11-16 DIAGNOSIS — M7062 Trochanteric bursitis, left hip: Secondary | ICD-10-CM

## 2021-11-16 DIAGNOSIS — M19042 Primary osteoarthritis, left hand: Secondary | ICD-10-CM

## 2021-11-16 DIAGNOSIS — Z87898 Personal history of other specified conditions: Secondary | ICD-10-CM

## 2021-11-16 MED ORDER — TRIAMCINOLONE ACETONIDE 40 MG/ML IJ SUSP
10.0000 mg | INTRAMUSCULAR | Status: AC | PRN
  Administered 2021-11-16: 10 mg via INTRAMUSCULAR

## 2021-11-16 MED ORDER — LIDOCAINE HCL 1 % IJ SOLN
0.5000 mL | INTRAMUSCULAR | Status: AC | PRN
  Administered 2021-11-16: .5 mL

## 2021-11-28 DIAGNOSIS — Z79891 Long term (current) use of opiate analgesic: Secondary | ICD-10-CM | POA: Diagnosis not present

## 2021-11-28 DIAGNOSIS — M47812 Spondylosis without myelopathy or radiculopathy, cervical region: Secondary | ICD-10-CM | POA: Diagnosis not present

## 2021-11-28 DIAGNOSIS — G43109 Migraine with aura, not intractable, without status migrainosus: Secondary | ICD-10-CM | POA: Diagnosis not present

## 2021-11-28 DIAGNOSIS — G894 Chronic pain syndrome: Secondary | ICD-10-CM | POA: Diagnosis not present

## 2021-12-07 ENCOUNTER — Ambulatory Visit: Payer: Medicare Other | Admitting: Internal Medicine

## 2021-12-07 NOTE — Progress Notes (Signed)
? Patient ID: Kari Flynn, female    DOB: [birth year], 71 y.o.   MRN: [id number] ? ?HPI ?F former smoker, followed for OSA, Insomnia, complicated by hx of fibromyalgia and migraine.  ?NPSG 01/10/11-AHI 27.7/hour, desaturation to 86% ?------------------------------------------------------------ ? ? ?12/03/20- 71 year old female former smoker followed for OSA, insomnia, complicated by history fibromyalgia, migraine ?CPAP 5-20/[facility]        Replaced CPAP Feb, 2021 AirSense 10 AutoSet ?Download-compliance 100%, AHI 4.7/ hr ?Body weight today-173 lbs ?Covid vax-3 phizer ?Flu vax-had ?-----Pt states she has been waking up with a headache at least 3 days a week and states that she does wear her CPAP every night. ? Has been waking in AM with frontal HA about 3x/ week for several months. ?Nasal congestion as she lies down. Asks to try heated hose. ? ?12/08/21- 71 year old female former smoker followed for OSA, insomnia, complicated by history fibromyalgia, migraine ?CPAP 5-20/[facility]        Replaced CPAP Feb, 2021 AirSense 10 AutoSet ?Download-compliance 97%, AHI  7.1/ hr ?Body weight today- 169/ hr ?Covid vax-3 phizer ?Flu vax-had ?Download reviewed noting more sacral ulcer now.  She is not aware of any cardiac condition.  Sleeping well with CPAP.  Husband comments on frequent leaks.  I suggested mask fitting. ?Modafinil really helps and she needs refill. ? ?ROS-see HPI + = positive ?Constitutional:   No-   weight loss, night sweats, fevers, chills, +fatigue, lassitude. ?HEENT:   + headaches, difficulty swallowing, tooth/dental problems, sore throat,  ?     No-  sneezing, itching, ear ache, +nasal congestion, post nasal drip,  ?CV:  No-   chest pain, orthopnea, PND, swelling in lower extremities, anasarca,  dizziness, palpitations ?Resp: No- acute  shortness of breath with exertion or at rest.   ?           No-   productive cough,  No non-productive cough,  No- coughing up of blood.   ?           No-   change in color of  mucus.  No- wheezing.   ?Skin: No-   rash or lesions. ?GI:  No-   heartburn, indigestion, abdominal pain, nausea, vomiting,  ?GU:  ?MS:  No-   joint pain or swelling.   ?Neuro-     nothing unusual ?Psych:  No- change in mood or affect. No depression or anxiety.  No memory loss. ?  ?Objective:  ? Physical Exam ?General- Alert, Oriented, Affect-normal, Distress- none acute; medium build ?Skin- rash-none, lesions- none, excoriation- none ?Lymphadenopathy- none ?Head- atraumatic ?           Eyes- Gross vision intact, PERRLA, conjunctivae clear secretions; +periorbital puffy+ mild ?           Ears- Hearing, canals-normal ?           Nose- Clear, no-Septal dev, mucus, polyps, erosion, perforation  ?           Throat- Mallampati III-IV , mucosa clear , drainage- none, tonsils- atrophic ?Neck- flexible , trachea midline, no stridor , thyroid nl, carotid no bruit ?Chest - symmetrical excursion , unlabored ?          Heart/CV- RRR , no murmur , no gallop  , no rub, nl s1 s2 ?                          - JVD- none , edema- none, stasis changes- none, varices- none ?  Lung- clear to P&A, wheeze- none, cough- none , dullness-none, rub- none ?          Chest wall-  ?Abd-  ?Br/ Gen/ Rectal- Not done, not indicated ?Extrem- cyanosis- none, clubbing, none, atrophy- none, strength- nl ?Neuro- grossly intact to observation ? ? ?

## 2021-12-09 ENCOUNTER — Encounter: Payer: Self-pay | Admitting: Internal Medicine

## 2021-12-09 ENCOUNTER — Ambulatory Visit (INDEPENDENT_AMBULATORY_CARE_PROVIDER_SITE_OTHER): Payer: Medicare Other | Admitting: Internal Medicine

## 2021-12-09 VITALS — BP 120/70 | HR 72 | Temp 98.0°F | Ht 65.0 in | Wt 169.2 lb

## 2021-12-09 DIAGNOSIS — G4730 Sleep apnea, unspecified: Secondary | ICD-10-CM | POA: Diagnosis not present

## 2021-12-09 DIAGNOSIS — G4733 Obstructive sleep apnea (adult) (pediatric): Secondary | ICD-10-CM | POA: Diagnosis not present

## 2021-12-09 DIAGNOSIS — G4710 Hypersomnia, unspecified: Secondary | ICD-10-CM | POA: Diagnosis not present

## 2021-12-09 MED ORDER — MODAFINIL 200 MG PO TABS: ORAL_TABLET | ORAL | 5 refills | Status: DC

## 2021-12-09 NOTE — Assessment & Plan Note (Signed)
CPAP compliance is good.  Modafinil helps ?Plan-refill modafinil.  Continue good sleep habits. ?

## 2021-12-09 NOTE — Assessment & Plan Note (Signed)
Benefits from CPAP with good compliance and control.  Mild central sleep apnea is noted with no concern at this time. ?Plan-continue auto 5-20.  Refer for mask fitting. ?

## 2021-12-09 NOTE — Patient Instructions (Signed)
Modafinil reordered ? ?Order- schedule mask fitting at sleep center ? ?Please call if we can help ? ? ?

## 2021-12-12 ENCOUNTER — Other Ambulatory Visit: Payer: Medicare Other

## 2021-12-12 DIAGNOSIS — R5383 Other fatigue: Secondary | ICD-10-CM

## 2021-12-12 DIAGNOSIS — Z136 Encounter for screening for cardiovascular disorders: Secondary | ICD-10-CM

## 2021-12-12 DIAGNOSIS — G43809 Other migraine, not intractable, without status migrainosus: Secondary | ICD-10-CM | POA: Diagnosis not present

## 2021-12-12 DIAGNOSIS — F5101 Primary insomnia: Secondary | ICD-10-CM | POA: Diagnosis not present

## 2021-12-13 LAB — CBC WITH DIFFERENTIAL/PLATELET
Absolute Monocytes: 322 cells/uL (ref 200–950)
Basophils Absolute: 19 cells/uL (ref 0–200)
Basophils Relative: 0.4 %
Eosinophils Absolute: 38 cells/uL (ref 15–500)
Eosinophils Relative: 0.8 %
HCT: 42.3 % (ref 35.0–45.0)
Hemoglobin: 14 g/dL (ref 11.7–15.5)
Lymphs Abs: 859 cells/uL (ref 850–3900)
MCH: 30.3 pg (ref 27.0–33.0)
MCHC: 33.1 g/dL (ref 32.0–36.0)
MCV: 91.6 fL (ref 80.0–100.0)
MPV: 10.1 fL (ref 7.5–12.5)
Monocytes Relative: 6.7 %
Neutro Abs: 3562 cells/uL (ref 1500–7800)
Neutrophils Relative %: 74.2 %
Platelets: 202 10*3/uL (ref 140–400)
RBC: 4.62 10*6/uL (ref 3.80–5.10)
RDW: 12.4 % (ref 11.0–15.0)
Total Lymphocyte: 17.9 %
WBC: 4.8 10*3/uL (ref 3.8–10.8)

## 2021-12-13 LAB — COMPLETE METABOLIC PANEL WITH GFR
AG Ratio: 1.8 (calc) (ref 1.0–2.5)
ALT: 16 U/L (ref 6–29)
AST: 19 U/L (ref 10–35)
Albumin: 4.2 g/dL (ref 3.6–5.1)
Alkaline phosphatase (APISO): 92 U/L (ref 37–153)
BUN: 21 mg/dL (ref 7–25)
CO2: 30 mmol/L (ref 20–32)
Calcium: 9.5 mg/dL (ref 8.6–10.4)
Chloride: 105 mmol/L (ref 98–110)
Creat: 0.73 mg/dL (ref 0.60–1.00)
Globulin: 2.4 g/dL (calc) (ref 1.9–3.7)
Glucose, Bld: 88 mg/dL (ref 65–99)
Potassium: 4.4 mmol/L (ref 3.5–5.3)
Sodium: 143 mmol/L (ref 135–146)
Total Bilirubin: 0.5 mg/dL (ref 0.2–1.2)
Total Protein: 6.6 g/dL (ref 6.1–8.1)
eGFR: 87 mL/min/{1.73_m2} (ref >=60)

## 2021-12-13 LAB — LIPID PANEL
Cholesterol: 193 mg/dL (ref <200)
HDL: 78 mg/dL (ref >=50)
LDL Cholesterol (Calc): 101 mg/dL (calc) — ABNORMAL HIGH
Non-HDL Cholesterol (Calc): 115 mg/dL (calc) (ref <130)
Total CHOL/HDL Ratio: 2.5 (calc) (ref <5.0)
Triglycerides: 59 mg/dL (ref <150)

## 2021-12-13 LAB — TSH: TSH: 2.26 mIU/L (ref 0.40–4.50)

## 2021-12-20 ENCOUNTER — Ambulatory Visit (INDEPENDENT_AMBULATORY_CARE_PROVIDER_SITE_OTHER): Payer: Medicare Other | Admitting: Internal Medicine

## 2021-12-20 ENCOUNTER — Encounter: Payer: Self-pay | Admitting: Internal Medicine

## 2021-12-20 VITALS — BP 122/74 | HR 71 | Temp 98.6°F | Ht 65.25 in | Wt 168.5 lb

## 2021-12-20 DIAGNOSIS — K5903 Drug induced constipation: Secondary | ICD-10-CM

## 2021-12-20 DIAGNOSIS — G250 Essential tremor: Secondary | ICD-10-CM | POA: Diagnosis not present

## 2021-12-20 DIAGNOSIS — T402X5A Adverse effect of other opioids, initial encounter: Secondary | ICD-10-CM

## 2021-12-20 DIAGNOSIS — M797 Fibromyalgia: Secondary | ICD-10-CM

## 2021-12-20 DIAGNOSIS — Z8669 Personal history of other diseases of the nervous system and sense organs: Secondary | ICD-10-CM

## 2021-12-20 DIAGNOSIS — M170 Bilateral primary osteoarthritis of knee: Secondary | ICD-10-CM | POA: Diagnosis not present

## 2021-12-20 DIAGNOSIS — G4733 Obstructive sleep apnea (adult) (pediatric): Secondary | ICD-10-CM

## 2021-12-20 DIAGNOSIS — Z0000 Encounter for general adult medical examination without abnormal findings: Secondary | ICD-10-CM | POA: Diagnosis not present

## 2021-12-20 DIAGNOSIS — Z1211 Encounter for screening for malignant neoplasm of colon: Secondary | ICD-10-CM | POA: Diagnosis not present

## 2021-12-20 DIAGNOSIS — M8580 Other specified disorders of bone density and structure, unspecified site: Secondary | ICD-10-CM

## 2021-12-20 LAB — HEMOCCULT GUIAC POC 1CARD (OFFICE): Fecal Occult Blood, POC: NEGATIVE

## 2021-12-20 LAB — POCT URINALYSIS DIPSTICK
Bilirubin, UA: NEGATIVE
Blood, UA: NEGATIVE
Glucose, UA: NEGATIVE
Ketones, UA: NEGATIVE
Leukocytes, UA: NEGATIVE
Nitrite, UA: NEGATIVE
Protein, UA: NEGATIVE
Spec Grav, UA: <=1.005 — AB (ref 1.010–1.025)
Urobilinogen, UA: 0.2 E.U./dL
pH, UA: 6 (ref 5.0–8.0)

## 2021-12-20 MED ORDER — HYOSCYAMINE SULFATE SL 0.125 MG SL SUBL: SUBLINGUAL_TABLET | SUBLINGUAL | 1 refills | Status: DC

## 2021-12-20 NOTE — Patient Instructions (Addendum)
Referral to [facility]  for colonoscopy.  It was a pleasure to see you today.  Continue current medications and follow-up in 1 year or as needed. ?

## 2021-12-20 NOTE — Progress Notes (Signed)
? ? ? ?Annual Wellness Visit ? ?  ? ?Patient: Kari Flynn, Female    DOB: Oct 19, 1950, 71 y.o.   MRN: [id number] ?Visit Date: 12/20/2021 ? ?Chief Complaint  ?Patient presents with  ? Medicare Wellness  ? ?Subjective  ?  ?Kari Flynn is a 71 y.o. Female who presents today for her Annual Wellness Visit. ? ?HPI She is also here for health maintenance exam and evaluation of medical issues. ? ?Patient feels well and is doing well.  She has history of fibromyalgia syndrome and pain in her knees/arthritis followed by Dr.Deveshwar, Rheumatologist.  Also gets steroid injections for trochanteric bursitis from time to time. ? ?Longstanding history of migraine headaches. ? ?Has  had issues with constipation aggravated by pain medication. Able to manage this. ? ?History of sleep apnea and uses CPAP device.  Saw Dr. Keturah Barre recently for follow-up of sleep apnea.  Still has occasional migraine headaches.  Continues with Provigil per Dr. Annamaria Boots. ? ?Followed at [facility] pain management by Dr. Nicholaus Bloom who has prescribed methadone. ? ?History of benign familial tremor diagnosed by Dr. Rexene Alberts, Neurologist in May 2021. ? ?She will need follow-up on colonoscopy.  Last colonoscopy was by  Dr. Delfin Edis in 2015 due to right abdominal pain. ? ?Had cataract extractions both eyes by Dr. Satira Sark Fall 2021. ? ?Past medical history: D&C 1979, bilateral tubal ligation 1987, bunionectomy 1990, benign right breast biopsy February 1995, infected laceration of right leg during the Summer of 2015. ? ?Social history: She is married.  2 adult sons.  Does not smoke.  Formerly worked as a Writer at [employer]. ? ?Past medical history: D&C 1979, bilateral tubal ligation 1997, bunionectomy 1990, benign right breast biopsy February 1995, infected laceration of right leg during the Summer 2015. ? ?Family history: Brother with history of MI, diabetes mellitus, chronic kidney disease.  Mother died 17-Sep-2013 with congestive heart  failure complications.  Mother had multiple myeloma, hypertension and gout ? ? ?Patient Care Team: ?Elby Showers, MD as PCP - General (Internal Medicine) ? ?Review of Systems ? ? Objective  ?  ?Vitals: BP 122/74   Pulse 71   Temp 98.6 ?F (37 ?C) (Tympanic)   Ht 5' 5.25" (1.657 m)   Wt 168 lb 8 oz (76.4 kg)   SpO2 98%   BMI 27.83 kg/m?  ? ?Physical Exam ? ?Skin: Warm and dry.  No cervical adenopathy.  No thyromegaly.  No carotid bruits.  TMs are clear.  Chest is clear to auscultation.  Cardiac exam: Regular rate and rhythm without murmurs or ectopy.  Abdomen: Soft nondistended without hepatosplenomegaly masses or tenderness.  No lower extremity pitting edema.  Bimanual exam is normal.  Affect thought and judgment are normal.  Neuro is intact without gross focal deficits. ? ? ?Most recent functional status assessment: ? ?  12/20/2021  ? 11:00 AM  ?In your present state of health, do you have any difficulty performing the following activities:  ?Hearing? 0  ?Vision? 0  ?Difficulty concentrating or making decisions? 0  ?Walking or climbing stairs? 0  ?Dressing or bathing? 0  ?Doing errands, shopping? 0  ?Preparing Food and eating ? N  ?Using the Toilet? N  ?In the past six months, have you accidently leaked urine? N  ?Do you have problems with loss of bowel control? N  ?Managing your Medications? N  ?Managing your Finances? N  ?Housekeeping or managing your Housekeeping? N  ? ?Most recent fall risk  assessment: ? ?  12/20/2021  ? 10:59 AM  ?Fall Risk   ?Falls in the past year? 0  ?Number falls in past yr: 0  ?Injury with Fall? 0  ?Risk for fall due to : No Fall Risks  ?Follow up Falls evaluation completed  ? ? Most recent depression screenings: ? ?  12/20/2021  ? 10:59 AM 12/16/2020  ?  2:11 PM  ?PHQ 2/9 Scores  ?PHQ - 2 Score 0 0  ? ?Most recent cognitive screening: ? ?  12/20/2021  ? 11:00 AM  ?6CIT Screen  ?What Year? 0 points  ?What month? 0 points  ?What time? 0 points  ?Count back from 20 0 points  ?Months in  reverse 0 points  ?Repeat phrase 0 points  ?Total Score 0 points  ? ? ? ? ? Assessment & Plan  ?  ? ?Annual wellness visit done today including the all of the following: ?Reviewed patient's Family Medical History ?Reviewed and updated list of patient's medical providers ?Assessment of cognitive impairment was done ?Assessed patient's functional ability ?Established a written schedule for health screening services ?Health Risk Assessent Completed and Reviewed ? ?Discussed health benefits of physical activity, and encouraged her to engage in regular exercise appropriate for her age and condition.  ?  ?Longstanding history of fibromyalgia followed by Dr. Estanislado Pandy, rheumatologist. ? ?Primary osteoarthritis of both knees treated by rheumatologist. ? ?Opioid-induced constipation-has chronic pain management by Dr. Nicholaus Bloom ? ?Migraine headaches treated by chronic pain management ? ?History of benign positional vertigo ? ?Familial tremor ? ?History of bilateral cataract extractions ? ?Plan: She will continue with current medications.  Labs are reviewed and are essentially within normal limits including CBC with differential c-Met lipid panel and TSH.  Urine dipstick is normal except for specific gravity.  She is well-hydrated today and that is why she has a low specific gravity.  Hemoccult card is negative. ?Plan is to continue with current medications and we will follow-up in 1 year or as needed.  Referral to [facility] for colonoscopy. ?  ? ?{I, Elby Showers, MD, have reviewed all documentation for this visit. The documentation on 12/20/21 for the exam, diagnosis, procedures, and orders are all accurate and complete. ? ? ?Angus Seller, CMA  ?

## 2021-12-26 ENCOUNTER — Telehealth: Payer: Self-pay

## 2021-12-26 NOTE — Telephone Encounter (Signed)
Ok to reapply for visco gel injections for both knees.

## 2021-12-26 NOTE — Telephone Encounter (Signed)
Patient called stating she is having bilateral knee pain and would like to apply for the Hyalgan injections again.  Last received on 07/11/21.   ?

## 2021-12-28 NOTE — Telephone Encounter (Signed)
VOB submitted for Euflexxa, bilateral knees BV pending 

## 2022-01-02 NOTE — Telephone Encounter (Signed)
Please call to schedule visco injections. ? ?Approved for Euflexxa, bilateral knees.  ?[employer]. ?Covered at 100% of the allowable amount with no coinsurance ?No co-pay ?Deductible has been met ?No pre-certifications  ? ?

## 2022-01-04 IMAGING — MG MM DIGITAL SCREENING BILAT W/ TOMO AND CAD
8 series · 8 of 24 positions shown · non-contrast
Comparison: Previous exam(s).

CLINICAL DATA: Screening.

EXAM:
DIGITAL SCREENING BILATERAL MAMMOGRAM WITH TOMOSYNTHESIS AND CAD
TECHNIQUE: Bilateral screening digital craniocaudal and mediolateral oblique
mammograms were obtained. Bilateral screening digital breast
tomosynthesis was performed. The images were evaluated with
computer-aided detection.

[R MLO synth-2D]
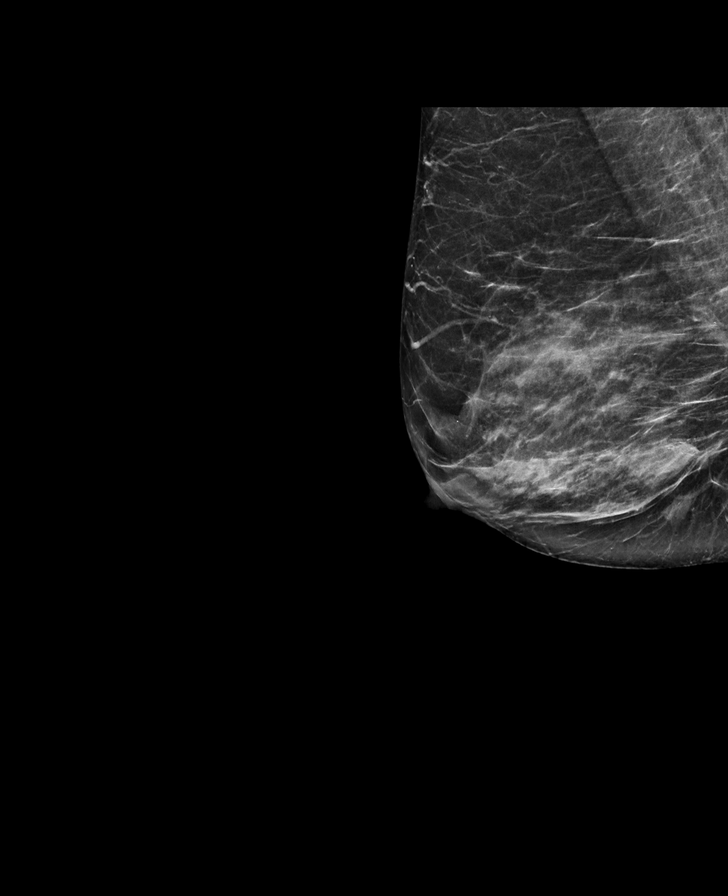

[L MLO synth-2D]
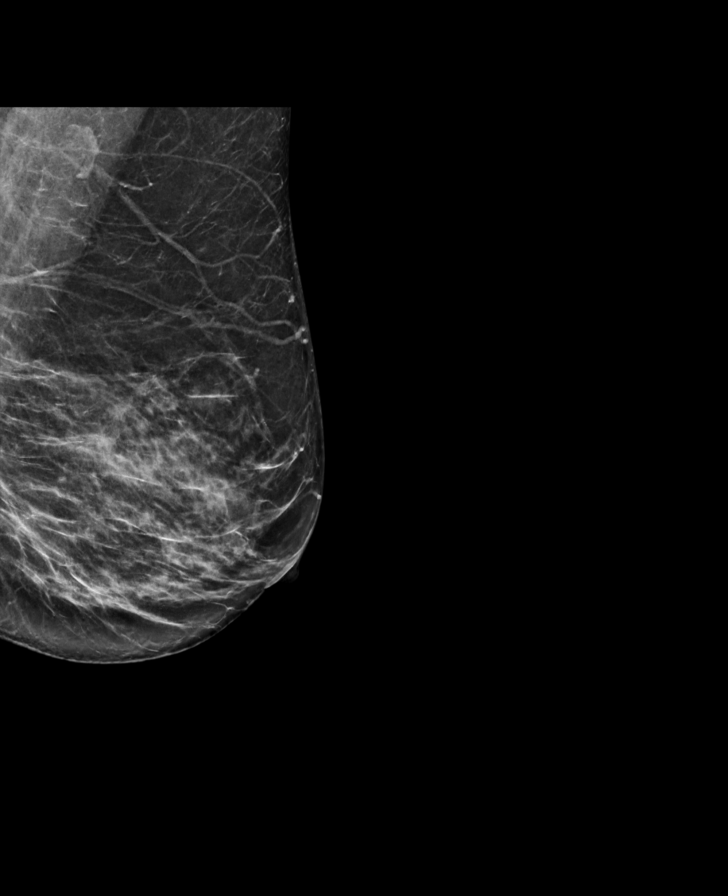

[R CC synth-2D]
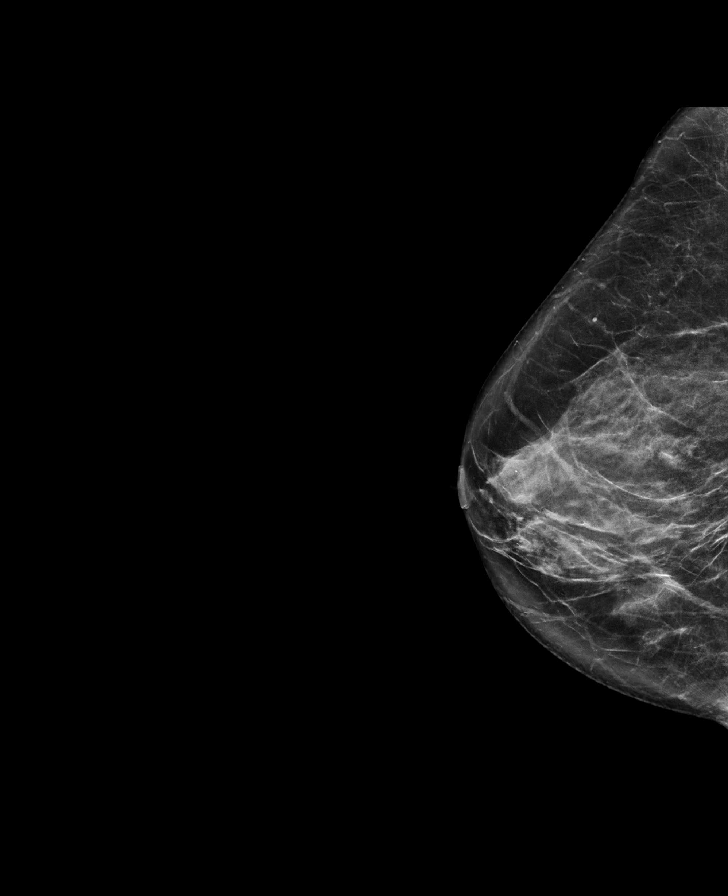

[L CC synth-2D]
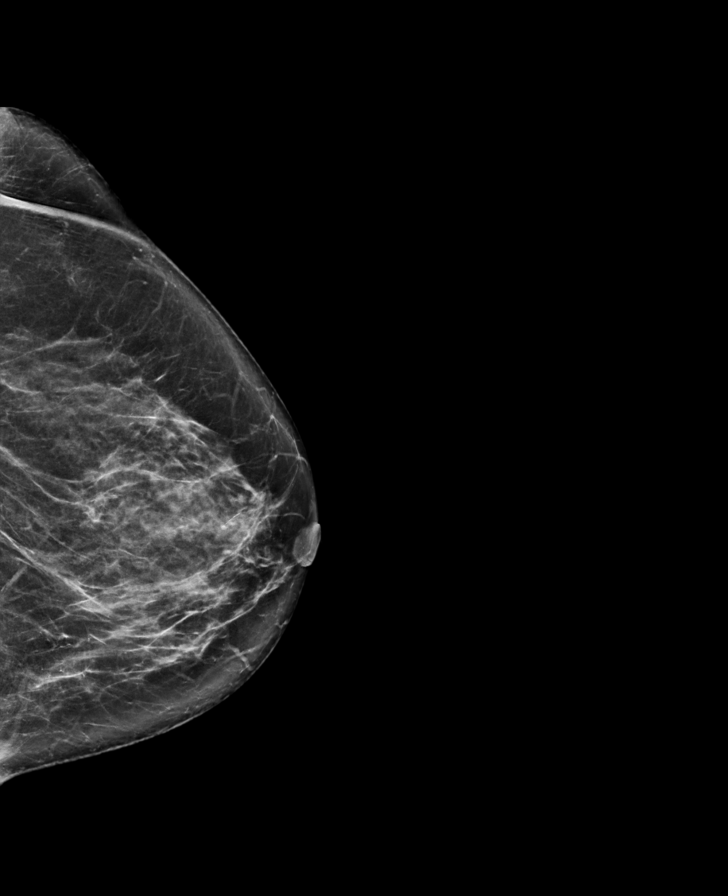

[R MLO tomo · tomo slice 33/65.0]
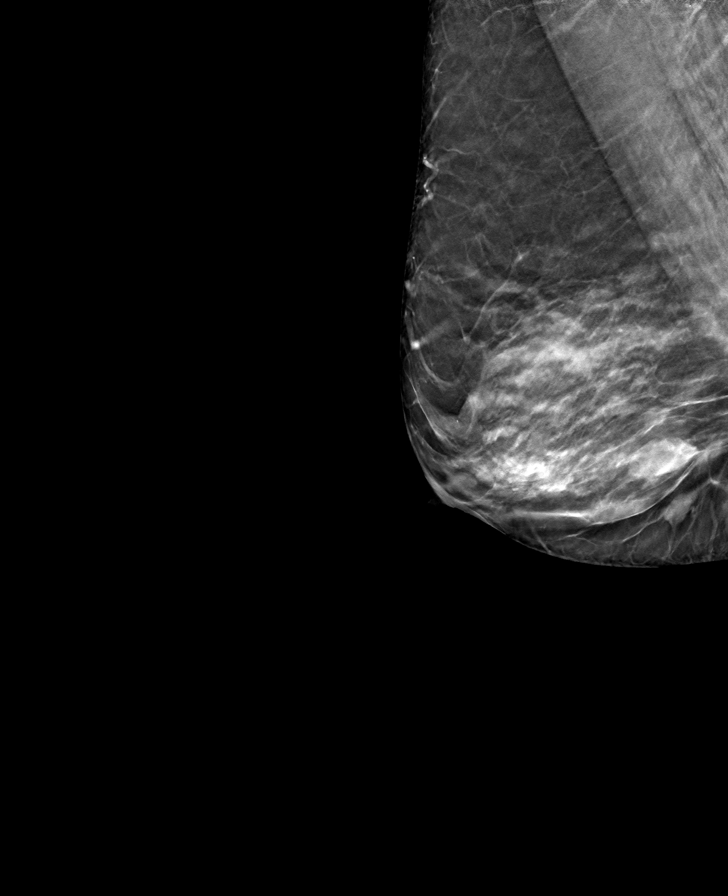

[L MLO tomo · tomo slice 37/72.0]
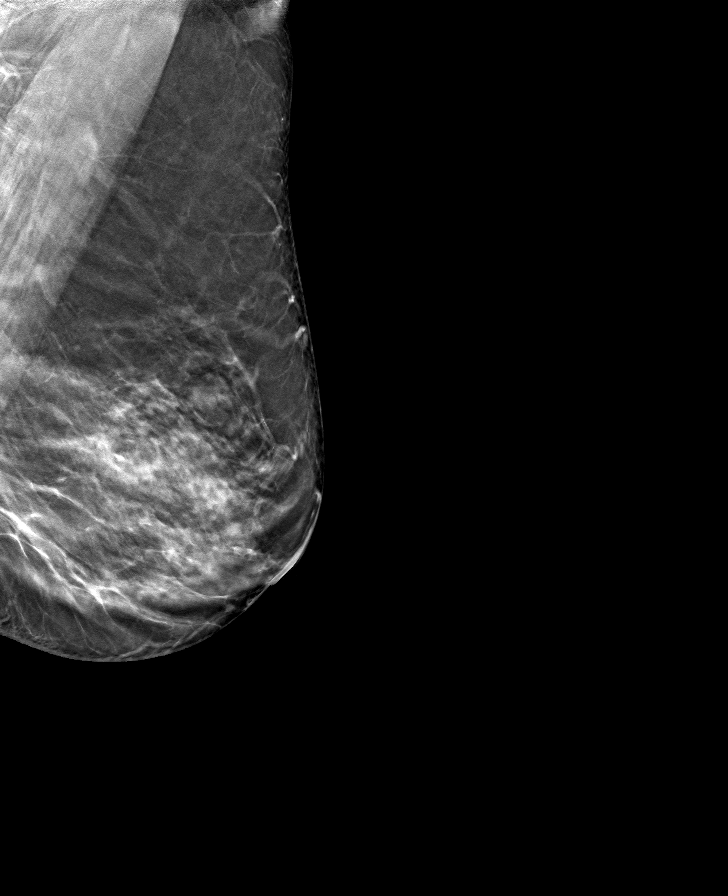

[L CC tomo · tomo slice 37/74.0]
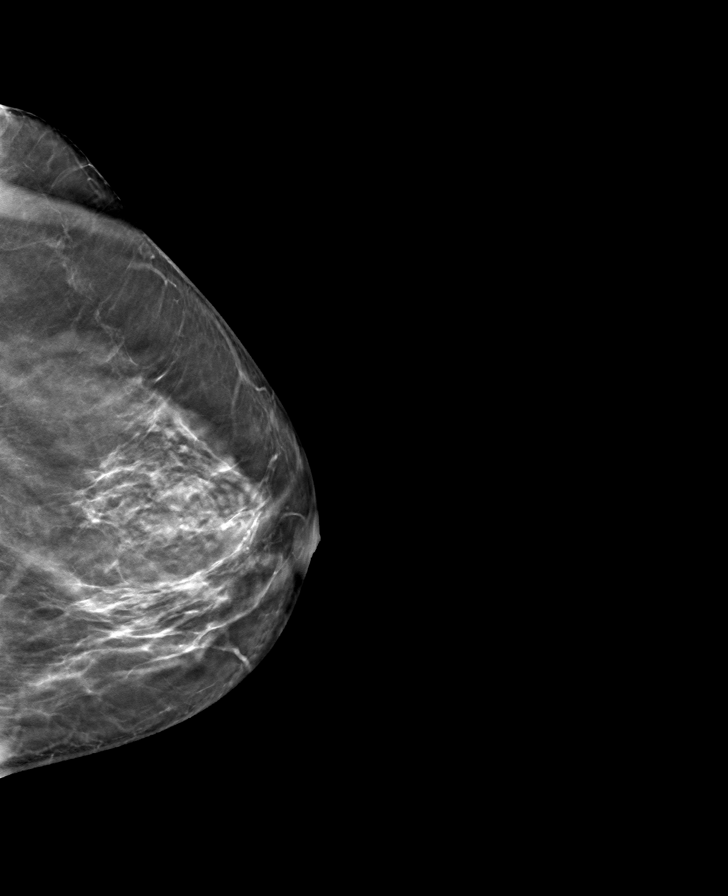

[R CC tomo · tomo slice 35/68.0]
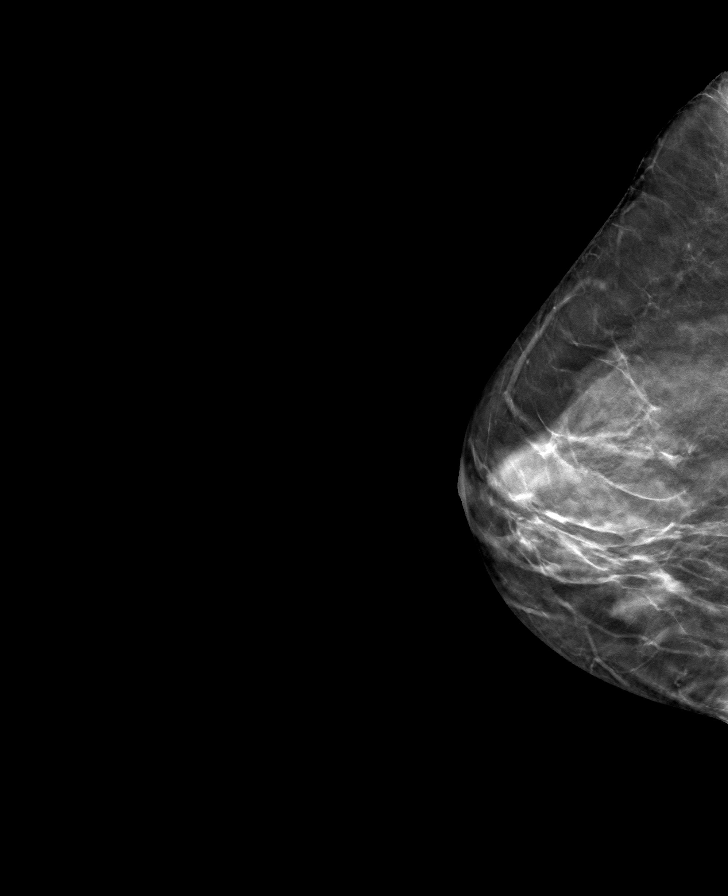

[8 of 24 positions shown; findings below may reference images not displayed]

ACR Breast Density Category c: The breast tissue is heterogeneously
dense, which may obscure small masses.
FINDINGS: There are no findings suspicious for malignancy.
IMPRESSION: No mammographic evidence of malignancy. A result letter of this
screening mammogram will be mailed directly to the patient.

RECOMMENDATION:
Screening mammogram in one year. (Code:Q3-W-BC3)

BI-RADS CATEGORY  1: Negative.

## 2022-01-13 ENCOUNTER — Other Ambulatory Visit: Payer: Self-pay | Admitting: Internal Medicine

## 2022-01-18 ENCOUNTER — Ambulatory Visit (HOSPITAL_BASED_OUTPATIENT_CLINIC_OR_DEPARTMENT_OTHER): Payer: Medicare Other | Attending: Internal Medicine | Admitting: Internal Medicine

## 2022-01-18 DIAGNOSIS — G4733 Obstructive sleep apnea (adult) (pediatric): Secondary | ICD-10-CM

## 2022-01-26 DIAGNOSIS — M47812 Spondylosis without myelopathy or radiculopathy, cervical region: Secondary | ICD-10-CM | POA: Diagnosis not present

## 2022-01-26 DIAGNOSIS — G43109 Migraine with aura, not intractable, without status migrainosus: Secondary | ICD-10-CM | POA: Diagnosis not present

## 2022-01-26 DIAGNOSIS — G894 Chronic pain syndrome: Secondary | ICD-10-CM | POA: Diagnosis not present

## 2022-01-26 DIAGNOSIS — Z79891 Long term (current) use of opiate analgesic: Secondary | ICD-10-CM | POA: Diagnosis not present

## 2022-02-13 ENCOUNTER — Ambulatory Visit: Payer: Medicare Other | Admitting: Physician Assistant

## 2022-02-14 ENCOUNTER — Ambulatory Visit (INDEPENDENT_AMBULATORY_CARE_PROVIDER_SITE_OTHER): Payer: Medicare Other | Admitting: Physician Assistant

## 2022-02-14 DIAGNOSIS — M170 Bilateral primary osteoarthritis of knee: Secondary | ICD-10-CM

## 2022-02-14 MED ORDER — SODIUM HYALURONATE (VISCOSUP) 20 MG/2ML IX SOSY
20.0000 mg | PREFILLED_SYRINGE | INTRA_ARTICULAR | Status: AC | PRN
  Administered 2022-02-14: 20 mg via INTRA_ARTICULAR

## 2022-02-14 MED ORDER — LIDOCAINE HCL 1 % IJ SOLN
1.5000 mL | INTRAMUSCULAR | Status: AC | PRN
  Administered 2022-02-14: 1.5 mL

## 2022-02-14 NOTE — Progress Notes (Signed)
   Procedure Note  Patient: Kari Flynn             Date of Birth: February 18, 1951           MRN: [id number]             Visit Date: 02/14/2022  Procedures: Visit Diagnoses:  1. Primary osteoarthritis of both knees    Euflexxa Bilateral knee injections #1  Large Joint Inj: bilateral knee on 02/14/2022 1:06 PM Indications: pain Details: 27 G 1.5 in needle, medial approach  Arthrogram: No  Medications (Right): 1.5 mL lidocaine 1 %; 20 mg Sodium Hyaluronate (Viscosup) 20 MG/2ML Aspirate (Right): 0 mL Medications (Left): 1.5 mL lidocaine 1 %; 20 mg Sodium Hyaluronate (Viscosup) 20 MG/2ML Aspirate (Left): 0 mL Outcome: tolerated well, no immediate complications Procedure, treatment alternatives, risks and benefits explained, specific risks discussed. Consent was given by the patient. Immediately prior to procedure a time out was called to verify the correct patient, procedure, equipment, support staff and site/side marked as required. Patient was prepped and draped in the usual sterile fashion.     Patient tolerated the procedures well.  Aftercare was discussed.  Hazel Sams, PA-C

## 2022-02-20 ENCOUNTER — Ambulatory Visit (INDEPENDENT_AMBULATORY_CARE_PROVIDER_SITE_OTHER): Payer: Medicare Other | Admitting: Physician Assistant

## 2022-02-20 ENCOUNTER — Encounter: Payer: Self-pay | Admitting: Physician Assistant

## 2022-02-20 DIAGNOSIS — M170 Bilateral primary osteoarthritis of knee: Secondary | ICD-10-CM

## 2022-02-20 MED ORDER — SODIUM HYALURONATE (VISCOSUP) 20 MG/2ML IX SOSY
20.0000 mg | PREFILLED_SYRINGE | INTRA_ARTICULAR | Status: AC | PRN
  Administered 2022-02-20: 20 mg via INTRA_ARTICULAR

## 2022-02-20 MED ORDER — LIDOCAINE HCL 1 % IJ SOLN
1.5000 mL | INTRAMUSCULAR | Status: AC | PRN
  Administered 2022-02-20: 1.5 mL

## 2022-03-01 ENCOUNTER — Ambulatory Visit (INDEPENDENT_AMBULATORY_CARE_PROVIDER_SITE_OTHER): Payer: Medicare Other | Admitting: Physician Assistant

## 2022-03-01 DIAGNOSIS — M170 Bilateral primary osteoarthritis of knee: Secondary | ICD-10-CM

## 2022-03-01 MED ORDER — LIDOCAINE HCL 1 % IJ SOLN
1.5000 mL | INTRAMUSCULAR | Status: AC | PRN
  Administered 2022-03-01: 1.5 mL

## 2022-03-01 MED ORDER — SODIUM HYALURONATE (VISCOSUP) 20 MG/2ML IX SOSY
20.0000 mg | PREFILLED_SYRINGE | INTRA_ARTICULAR | Status: AC | PRN
  Administered 2022-03-01: 20 mg via INTRA_ARTICULAR

## 2022-03-01 NOTE — Progress Notes (Signed)
   Procedure Note  Patient: Kari Flynn             Date of Birth: Dec 03, 1950           MRN: [id number]             Visit Date: 03/01/2022  Procedures: Visit Diagnoses:  1. Primary osteoarthritis of both knees    Euflexxa #3 bilateral knees, B/B Large Joint Inj: bilateral knee on 03/01/2022 1:18 PM Indications: pain Details: 27 G 1.5 in needle, medial approach  Arthrogram: No  Medications (Right): 1.5 mL lidocaine 1 %; 20 mg Sodium Hyaluronate (Viscosup) 20 MG/2ML Aspirate (Right): 0 mL Medications (Left): 1.5 mL lidocaine 1 %; 20 mg Sodium Hyaluronate (Viscosup) 20 MG/2ML Aspirate (Left): 0 mL Outcome: tolerated well, no immediate complications Procedure, treatment alternatives, risks and benefits explained, specific risks discussed. Consent was given by the patient. Immediately prior to procedure a time out was called to verify the correct patient, procedure, equipment, support staff and site/side marked as required. Patient was prepped and draped in the usual sterile fashion.     Patient tolerated the procedure well.  Aftercare was discussed.  Hazel Sams, PA-C

## 2022-03-08 DIAGNOSIS — H26493 Other secondary cataract, bilateral: Secondary | ICD-10-CM | POA: Diagnosis not present

## 2022-03-08 DIAGNOSIS — D3132 Benign neoplasm of left choroid: Secondary | ICD-10-CM | POA: Diagnosis not present

## 2022-03-08 DIAGNOSIS — H52203 Unspecified astigmatism, bilateral: Secondary | ICD-10-CM | POA: Diagnosis not present

## 2022-03-28 DIAGNOSIS — M47812 Spondylosis without myelopathy or radiculopathy, cervical region: Secondary | ICD-10-CM | POA: Diagnosis not present

## 2022-03-28 DIAGNOSIS — G894 Chronic pain syndrome: Secondary | ICD-10-CM | POA: Diagnosis not present

## 2022-03-28 DIAGNOSIS — G43109 Migraine with aura, not intractable, without status migrainosus: Secondary | ICD-10-CM | POA: Diagnosis not present

## 2022-03-28 DIAGNOSIS — Z79891 Long term (current) use of opiate analgesic: Secondary | ICD-10-CM | POA: Diagnosis not present

## 2022-04-06 DIAGNOSIS — D235 Other benign neoplasm of skin of trunk: Secondary | ICD-10-CM | POA: Diagnosis not present

## 2022-04-06 DIAGNOSIS — D225 Melanocytic nevi of trunk: Secondary | ICD-10-CM | POA: Diagnosis not present

## 2022-04-06 DIAGNOSIS — D2262 Melanocytic nevi of left upper limb, including shoulder: Secondary | ICD-10-CM | POA: Diagnosis not present

## 2022-04-06 DIAGNOSIS — L814 Other melanin hyperpigmentation: Secondary | ICD-10-CM | POA: Diagnosis not present

## 2022-04-06 DIAGNOSIS — L821 Other seborrheic keratosis: Secondary | ICD-10-CM | POA: Diagnosis not present

## 2022-04-06 DIAGNOSIS — D692 Other nonthrombocytopenic purpura: Secondary | ICD-10-CM | POA: Diagnosis not present

## 2022-04-06 DIAGNOSIS — D1801 Hemangioma of skin and subcutaneous tissue: Secondary | ICD-10-CM | POA: Diagnosis not present

## 2022-04-06 DIAGNOSIS — L570 Actinic keratosis: Secondary | ICD-10-CM | POA: Diagnosis not present

## 2022-04-14 DIAGNOSIS — M542 Cervicalgia: Secondary | ICD-10-CM | POA: Diagnosis not present

## 2022-04-14 DIAGNOSIS — M1991 Primary osteoarthritis, unspecified site: Secondary | ICD-10-CM | POA: Diagnosis not present

## 2022-04-14 DIAGNOSIS — M797 Fibromyalgia: Secondary | ICD-10-CM | POA: Diagnosis not present

## 2022-04-19 DIAGNOSIS — M797 Fibromyalgia: Secondary | ICD-10-CM | POA: Diagnosis not present

## 2022-04-19 DIAGNOSIS — M542 Cervicalgia: Secondary | ICD-10-CM | POA: Diagnosis not present

## 2022-04-19 DIAGNOSIS — M1991 Primary osteoarthritis, unspecified site: Secondary | ICD-10-CM | POA: Diagnosis not present

## 2022-04-25 DIAGNOSIS — M797 Fibromyalgia: Secondary | ICD-10-CM | POA: Diagnosis not present

## 2022-04-25 DIAGNOSIS — M542 Cervicalgia: Secondary | ICD-10-CM | POA: Diagnosis not present

## 2022-04-25 DIAGNOSIS — M1991 Primary osteoarthritis, unspecified site: Secondary | ICD-10-CM | POA: Diagnosis not present

## 2022-04-28 DIAGNOSIS — M542 Cervicalgia: Secondary | ICD-10-CM | POA: Diagnosis not present

## 2022-04-28 DIAGNOSIS — M1991 Primary osteoarthritis, unspecified site: Secondary | ICD-10-CM | POA: Diagnosis not present

## 2022-04-28 DIAGNOSIS — M797 Fibromyalgia: Secondary | ICD-10-CM | POA: Diagnosis not present

## 2022-04-28 HISTORY — PX: REFRACTIVE SURGERY: SHX103

## 2022-05-02 DIAGNOSIS — H26491 Other secondary cataract, right eye: Secondary | ICD-10-CM | POA: Diagnosis not present

## 2022-05-03 DIAGNOSIS — M1991 Primary osteoarthritis, unspecified site: Secondary | ICD-10-CM | POA: Diagnosis not present

## 2022-05-03 DIAGNOSIS — M542 Cervicalgia: Secondary | ICD-10-CM | POA: Diagnosis not present

## 2022-05-03 DIAGNOSIS — M797 Fibromyalgia: Secondary | ICD-10-CM | POA: Diagnosis not present

## 2022-05-05 NOTE — Progress Notes (Signed)
Office Visit Note  Patient: Kari Flynn             Date of Birth: Jan 30, 1951           MRN: [id number]             PCP: Elby Showers, MD Referring: Elby Showers, MD Visit Date: 05/18/2022 Occupation: '@GUAROCC' @  Subjective:  Neck pain and generalized pain.  History of Present Illness: Kari Flynn is a 71 y.o. female with history of osteoarthritis, degenerative disc disease and fibromyalgia.  She states her hip and knee joint pain has improved since her last visit.  She has been experiencing increased pain in her cervical spine since May 2023.  She states her symptoms got worse in July 2023.  She was evaluated by Dr. Hardin Negus and was referred to physical therapy.  She has been going to physical therapy for the last 5 weeks.  She had some stretching exercises which were helpful.  She also had dry needling.  She states after the right needling she had a lump on her cervical spine.  She continues to have discomfort in her cervical spine.  She continues to have pain and discomfort from fibromyalgia.  Activities of Daily Living:  Patient reports morning stiffness for 2 hours.   Patient Reports nocturnal pain.  Difficulty dressing/grooming: Denies Difficulty climbing stairs: Reports Difficulty getting out of chair: Reports Difficulty using hands for taps, buttons, cutlery, and/or writing: Denies  Review of Systems  Constitutional:  Positive for fatigue.  HENT:  Negative for mouth sores and mouth dryness.   Eyes:  Negative for dryness.  Respiratory:  Negative for shortness of breath.   Cardiovascular:  Negative for chest pain and palpitations.  Gastrointestinal:  Negative for blood in stool, constipation and diarrhea.  Endocrine: Negative for increased urination.  Genitourinary:  Negative for involuntary urination.  Musculoskeletal:  Positive for joint pain, joint pain, joint swelling, myalgias, muscle weakness, morning stiffness, muscle tenderness and myalgias. Negative for gait  problem.  Skin:  Negative for color change, rash, hair loss and sensitivity to sunlight.  Allergic/Immunologic: Negative for susceptible to infections.  Neurological:  Positive for headaches. Negative for dizziness.  Hematological:  Negative for swollen glands.  Psychiatric/Behavioral:  Negative for depressed mood and sleep disturbance. The patient is not nervous/anxious.     PMFS History:  Patient Active Problem List   Diagnosis Date Noted   Primary osteoarthritis of both hands 05/18/2021   Cervical spondylosis without myelopathy 01/12/2016   Primary osteoarthritis of first carpometacarpal joint of left hand 01/12/2016   Hypersomnia with sleep apnea 08/14/2014   Osteoarthritis of both knees 03/22/2014   Unspecified constipation 10/16/2013   Insomnia 06/23/2010   Obstructive sleep apnea 06/25/2008   Migraine headache 06/25/2008   Fibromyalgia 06/25/2008   Shortness of breath 06/25/2008    Past Medical History:  Diagnosis Date   Arthritis    Dyspnea    Lump of breast, left 11/08/2016   scheduled for a diagnostic/US mammo bilateral and stereo, US aspiration and ductogram.    Migraine, unspecified, without mention of intractable migraine without mention of status migrainosus    Myalgia and myositis, unspecified    Obstructive sleep apnea (adult) (pediatric)     Family History  Problem Relation Age of Onset   Hypertension Mother    Arthritis Mother    Hyperlipidemia Mother    Melanoma Mother    Diverticulitis Mother    Multiple myeloma Father    Arthritis Other  sibling   Sarcoidosis Other        sibling-also has NIDDM   Diabetes Brother    Sarcoidosis Brother    Heart attack Brother    Chronic Renal Failure Brother    Diabetes Paternal Grandfather    Colon cancer Neg Hx    Esophageal cancer Neg Hx    Rectal cancer Neg Hx    Stomach cancer Neg Hx    Past Surgical History:  Procedure Laterality Date   BLEPHAROPLASTY     BREAST BIOPSY     B9   BUNIONECTOMY      x2   CATARACT EXTRACTION, BILATERAL     01/2020, 02/2020   REFRACTIVE SURGERY Right 04/2022   laser eye surgery   WISDOM TOOTH EXTRACTION     Social History   Social History Narrative   Not on file   Immunization History  Administered Date(s) Administered   Fluad Quad(high Dose 65+) 06/19/2019, 06/07/2021   Influenza Split 06/09/2011, 08/01/2012, 05/28/2017   Influenza Whole 06/24/2009, 06/07/2010   Influenza, High Dose Seasonal PF 07/13/2014, 07/06/2015, 05/31/2017, 07/05/2018, 07/29/2020   Influenza,inj,Quad PF,6+ Mos 08/01/2013   Influenza-Unspecified 07/25/2014, 07/06/2015, 06/06/2016   PFIZER(Purple Top)SARS-COV-2 Vaccination 10/06/2019, 10/30/2019, 05/12/2020   Pneumococcal Conjugate-13 11/05/2014   Pneumococcal Polysaccharide-23 11/05/2017   Tdap 06/09/2011, 12/16/2020     Objective: Vital Signs: BP 132/83 (BP Location: Left Arm, Patient Position: Sitting, Cuff Size: Normal)   Pulse 65   Resp 16   Ht '5\' 4"'  (1.626 m)   Wt 173 lb 6.4 oz (78.7 kg)   BMI 29.76 kg/m    Physical Exam Vitals and nursing note reviewed.  Constitutional:      Appearance: She is well-developed.  HENT:     Head: Normocephalic and atraumatic.  Eyes:     Conjunctiva/sclera: Conjunctivae normal.  Cardiovascular:     Rate and Rhythm: Normal rate and regular rhythm.     Heart sounds: Normal heart sounds.  Pulmonary:     Effort: Pulmonary effort is normal.     Breath sounds: Normal breath sounds.  Abdominal:     General: Bowel sounds are normal.     Palpations: Abdomen is soft.  Musculoskeletal:     Cervical back: Normal range of motion.  Lymphadenopathy:     Cervical: No cervical adenopathy.  Skin:    General: Skin is warm and dry.     Capillary Refill: Capillary refill takes less than 2 seconds.     Comments: She had a large bruise on the nape of her neck.  Neurological:     Mental Status: She is alert and oriented to person, place, and time.  Psychiatric:        Behavior:  Behavior normal.      Musculoskeletal Exam: She had limited lateral rotation of the cervical spine especially to the right side.  Thoracic and lumbar spine were in good range of motion.  Shoulder joints, elbow joints, wrist joints with good range of motion.  She had bilateral PIP and DIP thickening.  She has subluxation of some of the DIP joints.  Hip joints and knee joints in good range of motion.  She had no tenderness over ankles or MTPs.  CDAI Exam: CDAI Score: -- Patient Global: --; Provider Global: -- Swollen: --; Tender: -- Joint Exam 05/18/2022   No joint exam has been documented for this visit   There is currently no information documented on the homunculus. Go to the Rheumatology activity and complete the homunculus joint exam.  Investigation: No additional findings.  Imaging: No results found.  Recent Labs: Lab Results  Component Value Date   WBC 4.8 12/12/2021   HGB 14.0 12/12/2021   PLT 202 12/12/2021   NA 143 12/12/2021   K 4.4 12/12/2021   CL 105 12/12/2021   CO2 30 12/12/2021   GLUCOSE 88 12/12/2021   BUN 21 12/12/2021   CREATININE 0.73 12/12/2021   BILITOT 0.5 12/12/2021   ALKPHOS 85 10/27/2016   AST 19 12/12/2021   ALT 16 12/12/2021   PROT 6.6 12/12/2021   ALBUMIN 4.1 10/27/2016   CALCIUM 9.5 12/12/2021   GFRAA 96 12/06/2020    Speciality Comments: No specialty comments available.  Procedures:  No procedures performed Allergies: Divalproex sodium, Meloxicam, and Valproic acid   Assessment / Plan:     Visit Diagnoses: Primary osteoarthritis of both knees - X-ray of bilateral knee joints were consistent with bilateral moderate osteoarthritis with bilateral moderate chondromalacia patella. s/p euflexxa 01/2022.  Patient states her knee joint discomfort is improved.  Primary osteoarthritis of both hands-she can do history of pain and stiffness in her bilateral hands.  She has bilateral DIP and PIP thickening with DIP subluxation.  Joint protection  muscle strengthening was discussed.  Trochanteric bursitis of both hips-she has noticed improvement in the trochanteric bursa pain.  IT band stretches were discussed.  Neck pain-she has been experiencing increased neck pain since July 2023.  She has been going to physical therapy for the last 5 weeks.  She states she developed a hematoma after dry needling on her cervical spine region.  She is having soreness in that area.  Cervical spondylosis without myelopathy - Plan: XR Cervical Spine 2 or 3 views.  X-rays showed multilevel spondylosis and facet joint arthropathy.  Most significant narrowing was noted between C5 and C6.  X-ray findings were reviewed with the patient.  She will continue with physical therapy.  Trapezius muscle spasm-she had bilateral trapezius spasm.  Stretching exercises were emphasized.  Fibromyalgia-she continues to have generalized pain and discomfort from fibromyalgia.  Stretching exercises and aerobic exercises were emphasized.  She would benefit from water aerobics.  History of insomnia-good hygiene was discussed.  History of fatigue-related to fibromyalgia and insomnia.  Osteopenia of multiple sites - DEXA 09/01/21: The BMD measured at Forearm Radius 33% is 0.733 g/cm2 with a T-scoreof -1.6.  Calcium rich diet and vitamin D was discussed.  Vitamin D deficiency  History of migraine -she is followed by Dr. Myles Rosenthal   History of sleep apnea - Uses CPAP  History of depression  Orders: Orders Placed This Encounter  Procedures   XR Cervical Spine 2 or 3 views   No orders of the defined types were placed in this encounter.    Follow-Up Instructions: Return in about 6 months (around 11/16/2022) for Osteoarthritis.   Bo Merino, MD  Note - This record has been created using Editor, commissioning.  Chart creation errors have been sought, but may not always  have been located. Such creation errors do not reflect on  the standard of medical care.

## 2022-05-16 DIAGNOSIS — M542 Cervicalgia: Secondary | ICD-10-CM | POA: Diagnosis not present

## 2022-05-16 DIAGNOSIS — M1991 Primary osteoarthritis, unspecified site: Secondary | ICD-10-CM | POA: Diagnosis not present

## 2022-05-16 DIAGNOSIS — M797 Fibromyalgia: Secondary | ICD-10-CM | POA: Diagnosis not present

## 2022-05-18 ENCOUNTER — Ambulatory Visit (INDEPENDENT_AMBULATORY_CARE_PROVIDER_SITE_OTHER): Payer: Medicare Other

## 2022-05-18 ENCOUNTER — Encounter: Payer: Self-pay | Admitting: Rheumatology

## 2022-05-18 ENCOUNTER — Ambulatory Visit: Payer: Medicare Other | Attending: Rheumatology | Admitting: Rheumatology

## 2022-05-18 VITALS — BP 132/83 | HR 65 | Resp 16 | Ht 64.0 in | Wt 173.4 lb

## 2022-05-18 DIAGNOSIS — M7061 Trochanteric bursitis, right hip: Secondary | ICD-10-CM | POA: Insufficient documentation

## 2022-05-18 DIAGNOSIS — M542 Cervicalgia: Secondary | ICD-10-CM | POA: Diagnosis not present

## 2022-05-18 DIAGNOSIS — M797 Fibromyalgia: Secondary | ICD-10-CM | POA: Diagnosis not present

## 2022-05-18 DIAGNOSIS — Z8669 Personal history of other diseases of the nervous system and sense organs: Secondary | ICD-10-CM | POA: Diagnosis not present

## 2022-05-18 DIAGNOSIS — M170 Bilateral primary osteoarthritis of knee: Secondary | ICD-10-CM | POA: Diagnosis not present

## 2022-05-18 DIAGNOSIS — M8589 Other specified disorders of bone density and structure, multiple sites: Secondary | ICD-10-CM | POA: Insufficient documentation

## 2022-05-18 DIAGNOSIS — M47812 Spondylosis without myelopathy or radiculopathy, cervical region: Secondary | ICD-10-CM

## 2022-05-18 DIAGNOSIS — Z87898 Personal history of other specified conditions: Secondary | ICD-10-CM | POA: Diagnosis not present

## 2022-05-18 DIAGNOSIS — M7062 Trochanteric bursitis, left hip: Secondary | ICD-10-CM | POA: Diagnosis not present

## 2022-05-18 DIAGNOSIS — E559 Vitamin D deficiency, unspecified: Secondary | ICD-10-CM | POA: Diagnosis not present

## 2022-05-18 DIAGNOSIS — Z8659 Personal history of other mental and behavioral disorders: Secondary | ICD-10-CM | POA: Insufficient documentation

## 2022-05-18 DIAGNOSIS — M19042 Primary osteoarthritis, left hand: Secondary | ICD-10-CM | POA: Diagnosis not present

## 2022-05-18 DIAGNOSIS — M19041 Primary osteoarthritis, right hand: Secondary | ICD-10-CM | POA: Diagnosis not present

## 2022-05-18 DIAGNOSIS — M62838 Other muscle spasm: Secondary | ICD-10-CM | POA: Insufficient documentation

## 2022-05-19 DIAGNOSIS — M797 Fibromyalgia: Secondary | ICD-10-CM | POA: Diagnosis not present

## 2022-05-19 DIAGNOSIS — M542 Cervicalgia: Secondary | ICD-10-CM | POA: Diagnosis not present

## 2022-05-19 DIAGNOSIS — M1991 Primary osteoarthritis, unspecified site: Secondary | ICD-10-CM | POA: Diagnosis not present

## 2022-05-26 ENCOUNTER — Other Ambulatory Visit: Payer: Self-pay | Admitting: Internal Medicine

## 2022-05-26 DIAGNOSIS — Z1231 Encounter for screening mammogram for malignant neoplasm of breast: Secondary | ICD-10-CM

## 2022-05-29 DIAGNOSIS — G894 Chronic pain syndrome: Secondary | ICD-10-CM | POA: Diagnosis not present

## 2022-05-29 DIAGNOSIS — G43109 Migraine with aura, not intractable, without status migrainosus: Secondary | ICD-10-CM | POA: Diagnosis not present

## 2022-05-29 DIAGNOSIS — Z79891 Long term (current) use of opiate analgesic: Secondary | ICD-10-CM | POA: Diagnosis not present

## 2022-05-29 DIAGNOSIS — M47812 Spondylosis without myelopathy or radiculopathy, cervical region: Secondary | ICD-10-CM | POA: Diagnosis not present

## 2022-05-30 DIAGNOSIS — H26492 Other secondary cataract, left eye: Secondary | ICD-10-CM | POA: Diagnosis not present

## 2022-06-16 DIAGNOSIS — Z23 Encounter for immunization: Secondary | ICD-10-CM | POA: Diagnosis not present

## 2022-06-20 ENCOUNTER — Ambulatory Visit: Payer: Medicare Other

## 2022-07-03 ENCOUNTER — Other Ambulatory Visit: Payer: Self-pay | Admitting: Internal Medicine

## 2022-07-03 NOTE — Telephone Encounter (Signed)
Modafinil refilled.

## 2022-07-03 NOTE — Telephone Encounter (Signed)
Pt lov was 12/09/21. Was told to f/u in about a year or as needed. Would you like to refill prescription ? Please advise

## 2022-07-31 DIAGNOSIS — Z79891 Long term (current) use of opiate analgesic: Secondary | ICD-10-CM | POA: Diagnosis not present

## 2022-07-31 DIAGNOSIS — M47812 Spondylosis without myelopathy or radiculopathy, cervical region: Secondary | ICD-10-CM | POA: Diagnosis not present

## 2022-07-31 DIAGNOSIS — G43109 Migraine with aura, not intractable, without status migrainosus: Secondary | ICD-10-CM | POA: Diagnosis not present

## 2022-07-31 DIAGNOSIS — G894 Chronic pain syndrome: Secondary | ICD-10-CM | POA: Diagnosis not present

## 2022-08-08 ENCOUNTER — Ambulatory Visit: Payer: Medicare Other

## 2022-09-28 ENCOUNTER — Encounter: Payer: Self-pay | Admitting: Internal Medicine

## 2022-09-28 ENCOUNTER — Telehealth: Payer: Self-pay | Admitting: Rheumatology

## 2022-09-28 ENCOUNTER — Telehealth: Payer: Self-pay

## 2022-09-28 ENCOUNTER — Ambulatory Visit (INDEPENDENT_AMBULATORY_CARE_PROVIDER_SITE_OTHER): Payer: Medicare Other | Admitting: Internal Medicine

## 2022-09-28 VITALS — BP 124/82 | HR 72 | Temp 98.2°F

## 2022-09-28 DIAGNOSIS — R0989 Other specified symptoms and signs involving the circulatory and respiratory systems: Secondary | ICD-10-CM | POA: Diagnosis not present

## 2022-09-28 DIAGNOSIS — J0100 Acute maxillary sinusitis, unspecified: Secondary | ICD-10-CM | POA: Diagnosis not present

## 2022-09-28 MED ORDER — DOXYCYCLINE HYCLATE 100 MG PO TABS: 100.0000 mg | ORAL_TABLET | Freq: Two times a day (BID) | ORAL | 0 refills | Status: DC

## 2022-09-28 MED ORDER — METHYLPREDNISOLONE ACETATE 80 MG/ML IJ SUSP
80.0000 mg | Freq: Once | INTRAMUSCULAR | Status: AC
  Administered 2022-09-28: 80 mg via INTRAMUSCULAR

## 2022-09-28 MED ORDER — FLUCONAZOLE 150 MG PO TABS: 150.0000 mg | ORAL_TABLET | Freq: Once | ORAL | 0 refills | Status: AC

## 2022-09-28 NOTE — Telephone Encounter (Signed)
Ok to reapply for visco gel injections for both knees.

## 2022-09-28 NOTE — Telephone Encounter (Signed)
Patient called stating she would like to apply for visco injections for her bilateral knees.

## 2022-09-28 NOTE — Progress Notes (Signed)
Patient Care Team: Elby Showers, MD as PCP - General (Internal Medicine)  Visit Date: 09/28/22  Subjective:    Patient ID: Kari Flynn , Female   DOB: 1951-04-12, 72 y.o.    MRN: [id number]   71 y.o. Female presents today for sinus/ear infection symptoms. Patient has a past medical history of migraines,myalgia and myositis, sleep apnea, dypnea, arthritis.  Reports experiencing ear pain, sinus pressure and congestion, and headaches for the past 2 weeks. Symptoms started with sore throat and congestion and then ear pain, sinus pressure and headache. Reports popping in right ear is worse than the left. Symptoms have been waxing and waning. She is using her CPAP machine nightly. Reports negative Covid-19 test this morning. Denies any sick contact. Reports that she had a Covid-19 vaccine in 10/23.  Had 2 episodes of syncope in the past 2 months. Treated with Antivert 25 mg three times daily.  Reports experiencing arthritis flare-ups in her neck. She has received neck injections from Dr. Estanislado Pandy with some relief but the pain always returns.    Past Medical History:  Diagnosis Date   Arthritis    Dyspnea    Lump of breast, left 11/08/2016   scheduled for a diagnostic/US mammo bilateral and stereo, US aspiration and ductogram.    Migraine, unspecified, without mention of intractable migraine without mention of status migrainosus    Myalgia and myositis, unspecified    Obstructive sleep apnea (adult) (pediatric)      Family History  Problem Relation Age of Onset   Hypertension Mother    Arthritis Mother    Hyperlipidemia Mother    Melanoma Mother    Diverticulitis Mother    Multiple myeloma Father    Arthritis Other        sibling   Sarcoidosis Other        sibling-also has NIDDM   Diabetes Brother    Sarcoidosis Brother    Heart attack Brother    Chronic Renal Failure Brother    Diabetes Paternal Grandfather    Colon cancer Neg Hx    Esophageal cancer Neg Hx     Rectal cancer Neg Hx    Stomach cancer Neg Hx     Social History   Social History Narrative   Not on file      Review of Systems  Constitutional:  Negative for chills, fever and malaise/fatigue.  HENT:  Positive for congestion (Sinus) and ear pain (Bilateral).   Eyes:  Negative for blurred vision.  Respiratory:  Positive for cough (Mild). Negative for sputum production and shortness of breath.   Cardiovascular:  Negative for chest pain, palpitations and leg swelling.  Gastrointestinal:  Negative for vomiting.  Musculoskeletal:  Negative for back pain.  Skin:  Negative for rash.  Neurological:  Positive for headaches. Negative for loss of consciousness.        Objective:   Vitals: BP 124/82   Pulse 72   Temp 98.2 F (36.8 C) (Tympanic)   SpO2 97%    Physical Exam Constitutional:      General: She is not in acute distress.    Appearance: Normal appearance. She is not ill-appearing.  HENT:     Head: Normocephalic and atraumatic.     Right Ear: Ear canal and external ear normal.     Left Ear: Ear canal and external ear normal.     Ears:     Comments: Left ear has more fluid with splayed reflex. Right ear has some  fluid.    Mouth/Throat:     Comments: Throat is slightly injected. Neck:     Vascular: No carotid bruit.  Pulmonary:     Effort: Pulmonary effort is normal. No respiratory distress.     Breath sounds: Normal breath sounds. No wheezing or rales.  Skin:    General: Skin is warm.  Neurological:     Mental Status: She is alert and oriented to person, place, and time.  Psychiatric:        Mood and Affect: Mood normal.        Behavior: Behavior normal.        Thought Content: Thought content normal.        Judgment: Judgment normal.       Results:   Studies obtained and personally reviewed by me:    Labs:       Component Value Date/Time   NA 143 12/12/2021 0946   K 4.4 12/12/2021 0946   CL 105 12/12/2021 0946   CO2 30 12/12/2021 0946    GLUCOSE 88 12/12/2021 0946   BUN 21 12/12/2021 0946   CREATININE 0.73 12/12/2021 0946   CALCIUM 9.5 12/12/2021 0946   PROT 6.6 12/12/2021 0946   ALBUMIN 4.1 10/27/2016 1158   AST 19 12/12/2021 0946   ALT 16 12/12/2021 0946   ALKPHOS 85 10/27/2016 1158   BILITOT 0.5 12/12/2021 0946   GFRNONAA 83 12/06/2020 0940   GFRAA 96 12/06/2020 0940     Lab Results  Component Value Date   WBC 4.8 12/12/2021   HGB 14.0 12/12/2021   HCT 42.3 12/12/2021   MCV 91.6 12/12/2021   PLT 202 12/12/2021    Lab Results  Component Value Date   CHOL 193 12/12/2021   HDL 78 12/12/2021   LDLCALC 101 (H) 12/12/2021   TRIG 59 12/12/2021   CHOLHDL 2.5 12/12/2021    No results found for: "HGBA1C"   Lab Results  Component Value Date   TSH 2.26 12/12/2021      Assessment & Plan:   Acute Maxillary Sinusitis: Prescribed Doxycycline 100 mg twice daily. Depo 80 mg injection administered.    I,Alexander Ruley,acting as a Education administrator for Elby Showers, MD.,have documented all relevant documentation on the behalf of Elby Showers, MD,as directed by  Elby Showers, MD while in the presence of Elby Showers, MD.   I, Elby Showers, MD, have reviewed all documentation for this visit. The documentation on 10/15/22 for the exam, diagnosis, procedures, and orders are all accurate and complete.

## 2022-09-28 NOTE — Telephone Encounter (Signed)
Last visit: 05/18/2022 Last series of visco: 01/2022-02/2022 euflexxa bilateral knees.  Last x-rays: 05/18/2021  Next office visit: 11/22/2022  Okay to re-apply for bilateral knee visco?

## 2022-09-28 NOTE — Telephone Encounter (Signed)
Patient called asking to be seen this afternoon or tomorrow due to possible sinus infection her symptoms are popping in ears and sinus pressure. I informed patient I would send message and we would call her back to schedule.

## 2022-09-28 NOTE — Telephone Encounter (Signed)
Csalled Pam to get more information, she said she has been having ears popping, sinus pressure, headache and sinus congestion for at least 2 weeks. I have ask her to do COVID test and call back with results and as long as it is negative we will see her at 2:00 today

## 2022-10-02 ENCOUNTER — Ambulatory Visit
Admission: RE | Admit: 2022-10-02 | Discharge: 2022-10-02 | Disposition: A | Discharge status: Home / Self Care | Payer: Medicare Other | Source: Ambulatory Visit | Attending: Internal Medicine | Admitting: Internal Medicine

## 2022-10-02 DIAGNOSIS — Z1231 Encounter for screening mammogram for malignant neoplasm of breast: Secondary | ICD-10-CM | POA: Diagnosis not present

## 2022-10-02 DIAGNOSIS — G894 Chronic pain syndrome: Secondary | ICD-10-CM | POA: Diagnosis not present

## 2022-10-02 DIAGNOSIS — G43109 Migraine with aura, not intractable, without status migrainosus: Secondary | ICD-10-CM | POA: Diagnosis not present

## 2022-10-02 DIAGNOSIS — Z79891 Long term (current) use of opiate analgesic: Secondary | ICD-10-CM | POA: Diagnosis not present

## 2022-10-02 DIAGNOSIS — M47812 Spondylosis without myelopathy or radiculopathy, cervical region: Secondary | ICD-10-CM | POA: Diagnosis not present

## 2022-10-10 NOTE — Telephone Encounter (Signed)
VOB submitted for Euflexxa, Bilateral knee(s) BV pending 

## 2022-10-10 NOTE — Telephone Encounter (Signed)
Please call to schedule visco injections.  Approved for Euflexxa, Bilateral knee(s). [place] Patient is covered at 100% after medicare pays. Deductible must be met before coverage applies No co-pay Deductible does apply No pre-certifications

## 2022-10-15 NOTE — Patient Instructions (Addendum)
You have been diagnosed with an acute maxillary sinus infection.  Please take doxycycline 100 mg twice daily for 10 days.  Depo-Medrol 80 mg IM injection given.  May take Diflucan if needed for Candida vaginitis following antibiotics

## 2022-10-23 NOTE — Telephone Encounter (Signed)
LMOM for patient to call the office to schedule Visco injections.

## 2022-11-06 NOTE — Progress Notes (Signed)
Office Visit Note  Patient: Kari Flynn             Date of Birth: 18-Mar-1951           MRN: [id number]             PCP: Elby Showers, MD Referring: Elby Showers, MD Visit Date: 11/20/2022 Occupation: @GUAROCC @  Subjective:  Euflexxa #1 for both knees   History of Present Illness: Kari Flynn is a 72 y.o. female with history of fibromyalgia, osteoarthritis, and osteopenia.  Patient presents today for bilateral euflexxa injections for both knees.  Patient continues to experience intermittent pain and stiffness in both knee joints due to underlying osteoarthritis.  She states that since her last office visit 5 to 6 months ago she has been experiencing increased neck pain and stiffness.  She states that she has discontinued meloxicam due to hair loss.  She states that she has had to take Aleve intermittently due to the severity of pain in her neck.  She has been experiencing muscle spasms intermittently.  She requested trapezius trigger point injections today. Overall her fibromyalgia pain has been more manageable since going to water aerobics 1 to 2 days/week.   Activities of Daily Living:  Patient reports morning stiffness for 2 hours.   Patient Denies nocturnal pain.  Difficulty dressing/grooming: Denies Difficulty climbing stairs: Reports Difficulty getting out of chair: Reports Difficulty using hands for taps, buttons, cutlery, and/or writing: Denies  Review of Systems  Constitutional:  Positive for fatigue.  HENT:  Negative for mouth sores and mouth dryness.   Eyes:  Negative for dryness.  Respiratory:  Negative for shortness of breath.   Cardiovascular:  Negative for chest pain and palpitations.  Gastrointestinal:  Positive for constipation. Negative for blood in stool and diarrhea.  Endocrine: Negative for increased urination.  Genitourinary:  Negative for involuntary urination.  Musculoskeletal:  Positive for joint pain, joint pain, joint swelling, myalgias, muscle  weakness, morning stiffness, muscle tenderness and myalgias. Negative for gait problem.  Skin:  Positive for hair loss. Negative for color change, rash and sensitivity to sunlight.  Allergic/Immunologic: Negative for susceptible to infections.  Neurological:  Positive for dizziness and headaches.  Hematological:  Negative for swollen glands.  Psychiatric/Behavioral:  Negative for depressed mood and sleep disturbance. The patient is not nervous/anxious.     PMFS History:  Patient Active Problem List   Diagnosis Date Noted   Primary osteoarthritis of both hands 05/18/2021   Cervical spondylosis without myelopathy 01/12/2016   Primary osteoarthritis of first carpometacarpal joint of left hand 01/12/2016   Hypersomnia with sleep apnea 08/14/2014   Osteoarthritis of both knees 03/22/2014   Unspecified constipation 10/16/2013   Insomnia 06/23/2010   Obstructive sleep apnea 06/25/2008   Migraine headache 06/25/2008   Fibromyalgia 06/25/2008   Shortness of breath 06/25/2008    Past Medical History:  Diagnosis Date   Arthritis    Dyspnea    Lump of breast, left 11/08/2016   scheduled for a diagnostic/US mammo bilateral and stereo, US aspiration and ductogram.    Migraine, unspecified, without mention of intractable migraine without mention of status migrainosus    Myalgia and myositis, unspecified    Obstructive sleep apnea (adult) (pediatric)     Family History  Problem Relation Age of Onset   Hypertension Mother    Arthritis Mother    Hyperlipidemia Mother    Melanoma Mother    Diverticulitis Mother    Multiple myeloma Father  Arthritis Other        sibling   Sarcoidosis Other        sibling-also has NIDDM   Diabetes Brother    Sarcoidosis Brother    Heart attack Brother    Chronic Renal Failure Brother    Diabetes Paternal Grandfather    Colon cancer Neg Hx    Esophageal cancer Neg Hx    Rectal cancer Neg Hx    Stomach cancer Neg Hx    Past Surgical History:   Procedure Laterality Date   BLEPHAROPLASTY     BREAST BIOPSY     B9   BUNIONECTOMY     x2   CATARACT EXTRACTION, BILATERAL     01/2020, 02/2020   REFRACTIVE SURGERY Right 04/2022   laser eye surgery   WISDOM TOOTH EXTRACTION     Social History   Social History Narrative   Not on file   Immunization History  Administered Date(s) Administered   Fluad Quad(high Dose 65+) 06/19/2019, 06/07/2021   Influenza Split 06/09/2011, 08/01/2012, 05/28/2017   Influenza Whole 06/24/2009, 06/07/2010   Influenza, High Dose Seasonal PF 07/13/2014, 07/06/2015, 05/31/2017, 07/05/2018, 07/29/2020   Influenza,inj,Quad PF,6+ Mos 08/01/2013   Influenza-Unspecified 07/25/2014, 07/06/2015, 06/06/2016, 06/16/2022   PFIZER(Purple Top)SARS-COV-2 Vaccination 10/06/2019, 10/30/2019, 05/12/2020   Pneumococcal Conjugate-13 11/05/2014   Pneumococcal Polysaccharide-23 11/05/2017   Tdap 06/09/2011, 12/16/2020     Objective: Vital Signs: BP 115/68 (BP Location: Left Arm, Patient Position: Sitting, Cuff Size: Normal)   Pulse 86   Resp 16   Ht 5\' 5"  (1.651 m)   Wt 178 lb (80.7 kg)   BMI 29.62 kg/m    Physical Exam Vitals and nursing note reviewed.  Constitutional:      Appearance: She is well-developed.  HENT:     Head: Normocephalic and atraumatic.  Eyes:     Conjunctiva/sclera: Conjunctivae normal.  Cardiovascular:     Rate and Rhythm: Normal rate and regular rhythm.     Heart sounds: Normal heart sounds.  Pulmonary:     Effort: Pulmonary effort is normal.     Breath sounds: Normal breath sounds.  Abdominal:     General: Bowel sounds are normal.     Palpations: Abdomen is soft.  Musculoskeletal:     Cervical back: Normal range of motion.  Lymphadenopathy:     Cervical: No cervical adenopathy.  Skin:    General: Skin is warm and dry.     Capillary Refill: Capillary refill takes less than 2 seconds.  Neurological:     Mental Status: She is alert and oriented to person, place, and time.   Psychiatric:        Behavior: Behavior normal.      Musculoskeletal Exam: Generalized hyperalgesia and positive tender points.  C-spine has limited range of motion without rotation especially to the left.  Trapezius muscle tension and tenderness bilaterally, left greater than right. Shoulder joints have discomfort with ROM.  Elbow joints, wrist joints, MCPs, PIPs, and DIPs good ROM with no synovitis.  Complete fist formation bilaterally.  Hip joints have good ROM.  Knee joints have good ROM with no warmth or effusion.   Ankle joints have good range of motion with no tenderness or synovitis.  CDAI Exam: CDAI Score: -- Patient Global: --; Provider Global: -- Swollen: --; Tender: -- Joint Exam 11/20/2022   No joint exam has been documented for this visit   There is currently no information documented on the homunculus. Go to the Rheumatology activity and complete the  homunculus joint exam.  Investigation: No additional findings.  Imaging: No results found.  Recent Labs: Lab Results  Component Value Date   WBC 4.8 12/12/2021   HGB 14.0 12/12/2021   PLT 202 12/12/2021   NA 143 12/12/2021   K 4.4 12/12/2021   CL 105 12/12/2021   CO2 30 12/12/2021   GLUCOSE 88 12/12/2021   BUN 21 12/12/2021   CREATININE 0.73 12/12/2021   BILITOT 0.5 12/12/2021   ALKPHOS 85 10/27/2016   AST 19 12/12/2021   ALT 16 12/12/2021   PROT 6.6 12/12/2021   ALBUMIN 4.1 10/27/2016   CALCIUM 9.5 12/12/2021   GFRAA 96 12/06/2020    Speciality Comments: No specialty comments available.  Procedures:  Large Joint Inj: bilateral knee on 11/20/2022 2:20 PM Indications: pain Details: 27 G 1.5 in needle, medial approach  Arthrogram: No  Medications (Right): 1.5 mL lidocaine 1 %; 20 mg Sodium Hyaluronate (Viscosup) 20 MG/2ML Aspirate (Right): 0 mL Medications (Left): 1.5 mL lidocaine 1 %; 20 mg Sodium Hyaluronate (Viscosup) 20 MG/2ML Aspirate (Left): 0 mL Outcome: tolerated well, no immediate  complications Procedure, treatment alternatives, risks and benefits explained, specific risks discussed. Consent was given by the patient. Immediately prior to procedure a time out was called to verify the correct patient, procedure, equipment, support staff and site/side marked as required. Patient was prepped and draped in the usual sterile fashion.    Trigger Point Inj  Date/Time: 11/20/2022 2:57 PM  Performed by: Ofilia Neas, PA-C Authorized by: Ofilia Neas, PA-C   Consent Given by:  Patient Site marked: the procedure site was marked   Timeout: prior to procedure the correct patient, procedure, and site was verified   Indications:  Pain Total # of Trigger Points:  2 Location: neck   Needle Size:  27 G Approach:  Dorsal Medications #1:  0.5 mL lidocaine 1 %; 10 mg triamcinolone acetonide 40 MG/ML Medications #2:  0.5 mL lidocaine 1 %; 10 mg triamcinolone acetonide 40 MG/ML Patient tolerance:  Patient tolerated the procedure well with no immediate complications  Allergies: Divalproex sodium, Meloxicam, and Valproic acid   Assessment / Plan:     Visit Diagnoses: Primary osteoarthritis of both hands: She has PIP and DIP thickening consistent with osteoarthritis of both hands.  No tenderness or inflammation noted today.  Complete fist formation bilaterally.  Discussed the importance of joint protection and muscle strengthening.  Primary osteoarthritis of both knees - X-ray of bilateral knee joints were consistent with bilateral moderate osteoarthritis with bilateral moderate chondromalacia patella.  She underwent Euflexxa injections for both knees in June/July 2023 which provided significant relief.  She presents today for the first Euflexxa injection of the series for both knees. She tolerated the procedures well.  Procedure notes were completed above.  Aftercare was discussed. - Plan: Large Joint Inj: bilateral knee  Trochanteric bursitis of both hips: She has good range of motion  of both hip joints with no groin pain currently.  No tenderness over the trochanteric bursa at this time.  Cervical spondylosis without myelopathy - X-rays showed multilevel spondylosis and facet joint arthropathy.  Most significant narrowing was noted between C5 and C6.  C-spine has limited range of motion with lateral rotation especially to the left.  She has not had any symptoms of radiculopathy.  She has trapezius muscle tension and tenderness bilaterally.  Bilateral trigger point trapezius injections were performed today.  She tolerated procedure well.  Procedure notes were completed above.  Trapezius muscle spasm:  Patient presents today with trapezius muscle tension and tenderness bilaterally.  She has been experiencing muscle spasms intermittently.  A prescription for methocarbamol 500 mg 1 tablet daily as needed for muscle spasms was sent to the pharmacy.  Bilateral trigger point injections were performed today.  She tolerated procedure well.  Procedure note was were completed above.  Aftercare was discussed.  Fibromyalgia: She continues to experience intermittent myalgias and muscle tenderness due to fibromyalgia.  She presents today with trapezius muscle tension and tenderness bilaterally.  She has been experiencing muscle spasms intermittently.  She has discontinued meloxicam due to hair loss but has had to take Aleve as needed for symptomatic relief.  Patient was given a prescription for methocarbamol 500 mg 1 tablet daily as needed for muscle spasms to take during flares.  Bilateral trapezius trigger point injections were performed today.  She was strongly encouraged to continue to go to water aerobics twice a week for exercise.  History of insomnia: Discussed the importance of good sleep hygiene.   History of fatigue: Chronic, stable.  She has been going to water aerobics twice a week which has been helpful.  Osteopenia of multiple sites - - DEXA 09/01/21: The BMD measured at Forearm Radius  33% is 0.733 g/cm2 with a T-scoreof -1.6.  She is taking a calcium and vitamin D supplement daily.  Due to update DEXA in January 2025.  Vitamin D deficiency: She is taking vitamin D 2000 units daily.  Other medical conditions are listed as follows:  History of migraine  History of sleep apnea  History of depression  Orders: Orders Placed This Encounter  Procedures   Large Joint Inj: bilateral knee   Trigger Point Inj   Meds ordered this encounter  Medications   methocarbamol (ROBAXIN) 500 MG tablet    Sig: Take 1 tablet (500 mg total) by mouth daily as needed for muscle spasms.    Dispense:  30 tablet    Refill:  0     Follow-Up Instructions: Return in about 6 months (around 05/23/2023) for Fibromyalgia, Osteoarthritis.   Ofilia Neas, PA-C  Note - This record has been created using Dragon software.  Chart creation errors have been sought, but may not always  have been located. Such creation errors do not reflect on  the standard of medical care.

## 2022-11-20 ENCOUNTER — Ambulatory Visit: Payer: Medicare Other | Attending: Physician Assistant | Admitting: Physician Assistant

## 2022-11-20 ENCOUNTER — Encounter: Payer: Self-pay | Admitting: Physician Assistant

## 2022-11-20 VITALS — BP 115/68 | HR 86 | Resp 16 | Ht 65.0 in | Wt 178.0 lb

## 2022-11-20 DIAGNOSIS — Z87898 Personal history of other specified conditions: Secondary | ICD-10-CM | POA: Insufficient documentation

## 2022-11-20 DIAGNOSIS — E559 Vitamin D deficiency, unspecified: Secondary | ICD-10-CM | POA: Insufficient documentation

## 2022-11-20 DIAGNOSIS — Z8659 Personal history of other mental and behavioral disorders: Secondary | ICD-10-CM | POA: Insufficient documentation

## 2022-11-20 DIAGNOSIS — M7061 Trochanteric bursitis, right hip: Secondary | ICD-10-CM | POA: Insufficient documentation

## 2022-11-20 DIAGNOSIS — M797 Fibromyalgia: Secondary | ICD-10-CM | POA: Diagnosis not present

## 2022-11-20 DIAGNOSIS — M7062 Trochanteric bursitis, left hip: Secondary | ICD-10-CM | POA: Diagnosis not present

## 2022-11-20 DIAGNOSIS — M19041 Primary osteoarthritis, right hand: Secondary | ICD-10-CM | POA: Insufficient documentation

## 2022-11-20 DIAGNOSIS — M19042 Primary osteoarthritis, left hand: Secondary | ICD-10-CM | POA: Insufficient documentation

## 2022-11-20 DIAGNOSIS — Z8669 Personal history of other diseases of the nervous system and sense organs: Secondary | ICD-10-CM | POA: Insufficient documentation

## 2022-11-20 DIAGNOSIS — M62838 Other muscle spasm: Secondary | ICD-10-CM | POA: Diagnosis not present

## 2022-11-20 DIAGNOSIS — M47812 Spondylosis without myelopathy or radiculopathy, cervical region: Secondary | ICD-10-CM | POA: Insufficient documentation

## 2022-11-20 DIAGNOSIS — M8589 Other specified disorders of bone density and structure, multiple sites: Secondary | ICD-10-CM | POA: Diagnosis not present

## 2022-11-20 DIAGNOSIS — M170 Bilateral primary osteoarthritis of knee: Secondary | ICD-10-CM | POA: Diagnosis not present

## 2022-11-20 MED ORDER — SODIUM HYALURONATE (VISCOSUP) 20 MG/2ML IX SOSY
20.0000 mg | PREFILLED_SYRINGE | INTRA_ARTICULAR | Status: AC | PRN
  Administered 2022-11-20: 20 mg via INTRA_ARTICULAR

## 2022-11-20 MED ORDER — TRIAMCINOLONE ACETONIDE 40 MG/ML IJ SUSP
10.0000 mg | INTRAMUSCULAR | Status: AC | PRN
  Administered 2022-11-20: 10 mg via INTRAMUSCULAR

## 2022-11-20 MED ORDER — LIDOCAINE HCL 1 % IJ SOLN
0.5000 mL | INTRAMUSCULAR | Status: AC | PRN
  Administered 2022-11-20: .5 mL

## 2022-11-20 MED ORDER — LIDOCAINE HCL 1 % IJ SOLN
1.5000 mL | INTRAMUSCULAR | Status: AC | PRN
  Administered 2022-11-20: 1.5 mL

## 2022-11-20 MED ORDER — METHOCARBAMOL 500 MG PO TABS: 500.0000 mg | ORAL_TABLET | Freq: Every day | ORAL | 0 refills | Status: DC | PRN

## 2022-11-22 ENCOUNTER — Ambulatory Visit: Payer: Medicare Other | Admitting: Physician Assistant

## 2022-11-27 ENCOUNTER — Ambulatory Visit: Payer: Medicare Other | Attending: Physician Assistant | Admitting: Physician Assistant

## 2022-11-27 DIAGNOSIS — M170 Bilateral primary osteoarthritis of knee: Secondary | ICD-10-CM | POA: Insufficient documentation

## 2022-11-27 DIAGNOSIS — Z79891 Long term (current) use of opiate analgesic: Secondary | ICD-10-CM | POA: Diagnosis not present

## 2022-11-27 DIAGNOSIS — G894 Chronic pain syndrome: Secondary | ICD-10-CM | POA: Diagnosis not present

## 2022-11-27 DIAGNOSIS — G43109 Migraine with aura, not intractable, without status migrainosus: Secondary | ICD-10-CM | POA: Diagnosis not present

## 2022-11-27 DIAGNOSIS — M47812 Spondylosis without myelopathy or radiculopathy, cervical region: Secondary | ICD-10-CM | POA: Diagnosis not present

## 2022-11-27 MED ORDER — LIDOCAINE HCL 1 % IJ SOLN
1.5000 mL | INTRAMUSCULAR | Status: AC | PRN
  Administered 2022-11-27: 1.5 mL

## 2022-11-27 MED ORDER — SODIUM HYALURONATE (VISCOSUP) 20 MG/2ML IX SOSY
20.0000 mg | PREFILLED_SYRINGE | INTRA_ARTICULAR | Status: AC | PRN
  Administered 2022-11-27: 20 mg via INTRA_ARTICULAR

## 2022-11-27 NOTE — Progress Notes (Signed)
   Procedure Note  Patient: Kari Flynn             Date of Birth: February 12, 1951           MRN: [id number]             Visit Date: 11/27/2022  Procedures: Visit Diagnoses:  1. Primary osteoarthritis of both knees    Euflexxa #2 Bilateral knee joint injections  Large Joint Inj: bilateral knee on 11/27/2022 12:48 PM Indications: pain Details: 27 G 1.5 in needle, medial approach  Arthrogram: No  Medications (Right): 1.5 mL lidocaine 1 %; 20 mg Sodium Hyaluronate (Viscosup) 20 MG/2ML Aspirate (Right): 0 mL Medications (Left): 1.5 mL lidocaine 1 %; 20 mg Sodium Hyaluronate (Viscosup) 20 MG/2ML Aspirate (Left): 0 mL Outcome: tolerated well, no immediate complications Procedure, treatment alternatives, risks and benefits explained, specific risks discussed. Consent was given by the patient. Immediately prior to procedure a time out was called to verify the correct patient, procedure, equipment, support staff and site/side marked as required. Patient was prepped and draped in the usual sterile fashion.     Patient tolerated the procedure well.  Aftercare was discussed.  Hazel Sams, PA-C

## 2022-12-04 ENCOUNTER — Ambulatory Visit: Payer: Medicare Other | Attending: Physician Assistant | Admitting: Physician Assistant

## 2022-12-04 DIAGNOSIS — M170 Bilateral primary osteoarthritis of knee: Secondary | ICD-10-CM | POA: Diagnosis not present

## 2022-12-04 MED ORDER — SODIUM HYALURONATE (VISCOSUP) 20 MG/2ML IX SOSY
20.0000 mg | PREFILLED_SYRINGE | INTRA_ARTICULAR | Status: AC | PRN
Start: 2022-12-04 — End: 2022-12-04
  Administered 2022-12-04: 20 mg via INTRA_ARTICULAR

## 2022-12-04 MED ORDER — LIDOCAINE HCL 1 % IJ SOLN
1.5000 mL | INTRAMUSCULAR | Status: AC | PRN
Start: 2022-12-04 — End: 2022-12-04
  Administered 2022-12-04: 1.5 mL

## 2022-12-04 NOTE — Progress Notes (Signed)
   Procedure Note  Patient: Kari Flynn             Date of Birth: 1951-08-21           MRN: [id number]             Visit Date: 12/04/2022  Procedures: Visit Diagnoses:  Euflexxa #3 bilateral knees, B/B 1. Primary osteoarthritis of both knees    Euflexxa #3 bilateral knee joint injections  Large Joint Inj: bilateral knee on 12/04/2022 1:17 PM Indications: pain Details: 27 G 1.5 in needle, medial approach  Arthrogram: No  Medications (Right): 1.5 mL lidocaine 1 %; 20 mg Sodium Hyaluronate (Viscosup) 20 MG/2ML Aspirate (Right): 0 mL Medications (Left): 1.5 mL lidocaine 1 %; 20 mg Sodium Hyaluronate (Viscosup) 20 MG/2ML Aspirate (Left): 0 mL Outcome: tolerated well, no immediate complications Procedure, treatment alternatives, risks and benefits explained, specific risks discussed. Consent was given by the patient. Immediately prior to procedure a time out was called to verify the correct patient, procedure, equipment, support staff and site/side marked as required. Patient was prepped and draped in the usual sterile fashion.      Patient tolerated the procedure well.  Aftercare was discussed.  Sherron Ales, PA-C

## 2022-12-08 NOTE — Progress Notes (Signed)
Patient ID: Kari Flynn, female    DOB: [birth year], 72 y.o.   MRN: [id number]  HPI F former smoker, followed for OSA, Insomnia, complicated by hx of fibromyalgia and migraine.  NPSG 01/10/11-AHI 27.7/hour, desaturation to 86% ------------------------------------------------------------ .  12/08/21- 72 year old female former smoker followed for OSA, insomnia, complicated by history fibromyalgia, migraine CPAP 5-20/[facility]        Replaced CPAP Feb, 2021 AirSense 10 AutoSet Download-compliance 97%, AHI  7.1/ hr Body weight today- 169/ hr Covid vax-3 phizer Flu vax-had Download reviewed noting more sacral ulcer now.  She is not aware of any cardiac condition.  Sleeping well with CPAP.  Husband comments on frequent leaks.  I suggested mask fitting. Modafinil really helps and she needs refill.  12/11/22- 72 year old female former smoker followed for OSA, insomnia, complicated by history fibromyalgia, migraine, Osteoarthritis,  CPAP 5-20/[facility]        Replaced CPAP Feb, 2021 AirSense 10 AutoSet -modafinil 200,  Download-compliance 93%, AI 5.4/ hr Body weight today- 178 lbs Download reviewed.  Doing well with CPAP. She finds splitting modafinil tablet in half, taking twice daily is working well for her for daytime alertness.  Continues attention to good sleep habits at night.  ROS-see HPI + = positive Constitutional:   No-   weight loss, night sweats, fevers, chills, +fatigue, lassitude. HEENT:   + headaches, difficulty swallowing, tooth/dental problems, sore throat,       No-  sneezing, itching, ear ache, +nasal congestion, post nasal drip,  CV:  No-   chest pain, orthopnea, PND, swelling in lower extremities, anasarca,  dizziness, palpitations Resp: No- acute  shortness of breath with exertion or at rest.              No-   productive cough,  No non-productive cough,  No- coughing up of blood.              No-   change in color of mucus.  No- wheezing.   Skin: No-   rash or lesions. GI:   No-   heartburn, indigestion, abdominal pain, nausea, vomiting,  GU:  MS:  No-   joint pain or swelling.   Neuro-     nothing unusual Psych:  No- change in mood or affect. No depression or anxiety.  No memory loss.   Objective:   Physical Exam General- Alert, Oriented, Affect-normal, Distress- none acute; medium build Skin- rash-none, lesions- none, excoriation- none Lymphadenopathy- none Head- atraumatic            Eyes- Gross vision intact, PERRLA, conjunctivae clear secretions;             Ears- Hearing, canals-normal            Nose- Clear, no-Septal dev, mucus, polyps, erosion, perforation             Throat- Mallampati III-IV , mucosa clear , drainage- none, tonsils- atrophic Neck- flexible , trachea midline, no stridor , thyroid nl, carotid no bruit Chest - symmetrical excursion , unlabored           Heart/CV- RRR , no murmur , no gallop  , no rub, nl s1 s2                           - JVD- none , edema- none, stasis changes- none, varices- none           Lung- clear to P&A, wheeze- none, cough- none , dullness-none,  rub- none           Chest wall-  Abd-  Br/ Gen/ Rectal- Not done, not indicated Extrem- cyanosis- none, clubbing, none, atrophy- none, strength- nl Neuro- grossly intact to observation

## 2022-12-11 ENCOUNTER — Ambulatory Visit (INDEPENDENT_AMBULATORY_CARE_PROVIDER_SITE_OTHER): Payer: Medicare Other | Admitting: Internal Medicine

## 2022-12-11 ENCOUNTER — Encounter: Payer: Self-pay | Admitting: Internal Medicine

## 2022-12-11 VITALS — BP 112/70 | HR 72 | Temp 98.5°F | Ht 65.0 in | Wt 178.4 lb

## 2022-12-11 DIAGNOSIS — G4733 Obstructive sleep apnea (adult) (pediatric): Secondary | ICD-10-CM | POA: Diagnosis not present

## 2022-12-11 DIAGNOSIS — G4710 Hypersomnia, unspecified: Secondary | ICD-10-CM

## 2022-12-11 DIAGNOSIS — G4730 Sleep apnea, unspecified: Secondary | ICD-10-CM

## 2022-12-11 MED ORDER — MODAFINIL 200 MG PO TABS: ORAL_TABLET | ORAL | 5 refills | Status: DC

## 2022-12-11 NOTE — Patient Instructions (Signed)
Modafinil refilled  We can continue CPAP auto 5-20  Please call if we can help

## 2022-12-15 NOTE — Progress Notes (Signed)
Annual Wellness Visit    Patient Care Team: Kari Mackintosh, MD as PCP - General (Internal Medicine)  Visit Date: 12/22/22   Chief Complaint  Patient presents with   Medicare Wellness    Subjective:   Patient: Kari Flynn, Female    DOB: Jan 28, 1951, 72 y.o.   MRN: [id number]  TREMEKA Flynn is a 72 y.o. Female who presents today for her Annual Wellness Visit. She has a history of arthritis, dyspnea, myalgia and myositis, obstructive sleep apnea, migraine headaches.  History of chronic pain, multilevel spondylosis and facet joint arthropathy treated with methadone 5 mg every eight hours, methocarbamol 500 mg daily as needed. She is doing aquatic therapy, heat therapy with little relief. Had an occipital nerve block yesterday that helped. Has regular knee injections.  History of vertigo treated with meclizine 25 mg three times daily as needed.  History of obstructive sleep apnea with CPAP.  LDL elevated at 105. Glucose normal. Kidney, liver function normal. Electrolytes normal. Blood proteins normal. TSH at 2.39. CBC normal.  Mammogram last completed 10/02/22. No mammographic evidence of malignancy. Recommended repeat in 2026.  Colonoscopy last completed 10/22/13. Results showed normal colon. Recommended repeat in 2025.  DEXA scan last completed 09/01/21. T-score of -1.6 at forearm radius.   Past Medical History:  Diagnosis Date   Arthritis    Dyspnea    Lump of breast, left 11/08/2016   scheduled for a diagnostic/US mammo bilateral and stereo, US aspiration and ductogram.    Migraine, unspecified, without mention of intractable migraine without mention of status migrainosus    Myalgia and myositis, unspecified    Obstructive sleep apnea (adult) (pediatric)      Family History  Problem Relation Age of Onset   Hypertension Mother    Arthritis Mother    Hyperlipidemia Mother    Melanoma Mother    Diverticulitis Mother    Multiple myeloma Father    Arthritis Other         sibling   Sarcoidosis Other        sibling-also has NIDDM   Diabetes Brother    Sarcoidosis Brother    Heart attack Brother    Chronic Renal Failure Brother    Diabetes Paternal Grandfather    Colon cancer Neg Hx    Esophageal cancer Neg Hx    Rectal cancer Neg Hx    Stomach cancer Neg Hx      Social History   Social History Narrative   Not on file     Review of Systems  Constitutional:  Negative for chills, fever, malaise/fatigue and weight loss.  HENT:  Negative for hearing loss, sinus pain and sore throat.   Respiratory:  Negative for cough, hemoptysis and shortness of breath.   Cardiovascular:  Negative for chest pain, palpitations, leg swelling and PND.  Gastrointestinal:  Negative for abdominal pain, constipation, diarrhea, heartburn, nausea and vomiting.  Genitourinary:  Negative for dysuria, frequency and urgency.  Musculoskeletal:  Positive for joint pain (Bilateral knees) and neck pain. Negative for back pain and myalgias.  Skin:  Negative for itching and rash.  Neurological:  Negative for dizziness, tingling, seizures and headaches.  Endo/Heme/Allergies:  Negative for polydipsia.  Psychiatric/Behavioral:  Negative for depression. The patient is not nervous/anxious.       Objective:   Vitals: BP 134/78   Pulse 75   Temp 97.7 F (36.5 C) (Tympanic)   Ht 5' 5.5" (1.664 m)   Wt 179 lb (81.2 kg)  SpO2 97%   BMI 29.33 kg/m   Physical Exam Vitals and nursing note reviewed.  Constitutional:      General: She is not in acute distress.    Appearance: Normal appearance. She is not ill-appearing or toxic-appearing.  HENT:     Head: Normocephalic and atraumatic.     Right Ear: Hearing, tympanic membrane, ear canal and external ear normal.     Left Ear: Hearing, tympanic membrane, ear canal and external ear normal.     Mouth/Throat:     Pharynx: Oropharynx is clear.  Eyes:     Extraocular Movements: Extraocular movements intact.     Pupils: Pupils are  equal, round, and reactive to light.  Neck:     Thyroid: No thyroid mass, thyromegaly or thyroid tenderness.     Vascular: No carotid bruit.  Cardiovascular:     Rate and Rhythm: Normal rate and regular rhythm. No extrasystoles are present.    Pulses:          Dorsalis pedis pulses are 1+ on the right side and 1+ on the left side.     Heart sounds: Normal heart sounds. No murmur heard.    No friction rub. No gallop.  Pulmonary:     Effort: Pulmonary effort is normal.     Breath sounds: Normal breath sounds. No decreased breath sounds, wheezing, rhonchi or rales.  Chest:     Chest wall: No mass.  Abdominal:     Palpations: Abdomen is soft. There is no hepatomegaly, splenomegaly or mass.     Tenderness: There is no abdominal tenderness.     Hernia: No hernia is present.  Musculoskeletal:     Cervical back: Normal range of motion.     Right lower leg: No edema.     Left lower leg: No edema.  Lymphadenopathy:     Cervical: No cervical adenopathy.     Upper Body:     Right upper body: No supraclavicular adenopathy.     Left upper body: No supraclavicular adenopathy.  Skin:    General: Skin is warm and dry.     Comments: Contusion right upper arm.  Neurological:     General: No focal deficit present.     Mental Status: She is alert and oriented to person, place, and time. Mental status is at baseline.     Sensory: Sensation is intact.     Motor: Motor function is intact. No weakness.     Deep Tendon Reflexes: Reflexes are normal and symmetric.  Psychiatric:        Attention and Perception: Attention normal.        Mood and Affect: Mood normal.        Speech: Speech normal.        Behavior: Behavior normal.        Thought Content: Thought content normal.        Cognition and Memory: Cognition normal.        Judgment: Judgment normal.      Most recent functional status assessment:    12/22/2022   10:03 AM  In your present state of health, do you have any difficulty  performing the following activities:  Hearing? 0  Vision? 0  Difficulty concentrating or making decisions? 0  Walking or climbing stairs? 0  Dressing or bathing? 0  Doing errands, shopping? 0  Preparing Food and eating ? N  Using the Toilet? N  In the past six months, have you accidently leaked urine? N  Do you have problems with loss of bowel control? N  Managing your Medications? N  Managing your Finances? N  Housekeeping or managing your Housekeeping? N   Most recent fall risk assessment:    12/22/2022   10:02 AM  Fall Risk   Falls in the past year? 0  Number falls in past yr: 0  Injury with Fall? 0  Risk for fall due to : No Fall Risks  Follow up Falls prevention discussed    Most recent depression screenings:    12/22/2022   10:03 AM 09/28/2022    2:04 PM  PHQ 2/9 Scores  PHQ - 2 Score 0 0   Most recent cognitive screening:    12/22/2022   10:04 AM  6CIT Screen  What Year? 0 points  What month? 0 points  What time? 0 points  Count back from 20 0 points  Months in reverse 0 points  Repeat phrase 0 points  Total Score 0 points     Results:   Studies obtained and personally reviewed by me:  Mammogram last completed 10/02/22. No mammographic evidence of malignancy. Recommended repeat in 2026.  Colonoscopy last completed 10/22/13. Results showed normal colon. Recommended repeat in 2025.  DEXA scan last completed 09/01/21. T-score of -1.6 at forearm radius.   Labs:       Component Value Date/Time   NA 142 12/19/2022 0920   K 4.4 12/19/2022 0920   CL 105 12/19/2022 0920   CO2 29 12/19/2022 0920   GLUCOSE 92 12/19/2022 0920   BUN 25 12/19/2022 0920   CREATININE 0.60 12/19/2022 0920   CALCIUM 9.6 12/19/2022 0920   PROT 6.5 12/19/2022 0920   ALBUMIN 4.1 10/27/2016 1158   AST 18 12/19/2022 0920   ALT 23 12/19/2022 0920   ALKPHOS 85 10/27/2016 1158   BILITOT 0.5 12/19/2022 0920   GFRNONAA 83 12/06/2020 0940   GFRAA 96 12/06/2020 0940     Lab Results   Component Value Date   WBC 3.8 12/19/2022   HGB 13.9 12/19/2022   HCT 41.6 12/19/2022   MCV 91.2 12/19/2022   PLT 203 12/19/2022    Lab Results  Component Value Date   CHOL 199 12/19/2022   HDL 78 12/19/2022   LDLCALC 105 (H) 12/19/2022   TRIG 70 12/19/2022   CHOLHDL 2.6 12/19/2022    No results found for: "HGBA1C"   Lab Results  Component Value Date   TSH 2.39 12/19/2022    Assessment & Plan:   Chronic pain, multilevel spondylosis and facet joint arthropathy: treated with methadone 5 mg every eight hours, methocarbamol 500 mg daily as needed. She is doing aquatic therapy, heat therapy with little relief. Had an occipital nerve block yesterday that helped. Has regular knee injections.  Vertigo: treated with meclizine 25 mg three times daily as needed.  Obstructive sleep apnea: stable with CPAP.  Shortness of breath: has had some shortness of breath when walking. Ordered CXr.  Mammogram: last completed 10/02/22. No mammographic evidence of malignancy. Recommended repeat in 2026.  Colonoscopy: last completed 10/22/13. Results showed normal colon. Recommended repeat in 2025.  DEXA scan: last completed 09/01/21. T-score of -1.6 at forearm radius.  Vaccine counseling: UTD on flu, tetanus, Covid-19 vaccines. Administered pneumococcal 20 vaccine. Discussed RSV vaccine.  Return in 1 year for health maintenance exam or as needed.     Annual wellness visit done today including the all of the following: Reviewed patient's Family Medical History Reviewed and updated list of patient's medical  providers Assessment of cognitive impairment was done Assessed patient's functional ability Established a written schedule for health screening services Health Risk Assessent Completed and Reviewed  Discussed health benefits of physical activity, and encouraged her to engage in regular exercise appropriate for her age and condition.        I,Alexander Ruley,acting as a Neurosurgeon for Kari Mackintosh, MD.,have documented all relevant documentation on the behalf of Kari Mackintosh, MD,as directed by  Kari Mackintosh, MD while in the presence of Kari Mackintosh, MD.   I, Kari Mackintosh, MD, have reviewed all documentation for this visit. The documentation on 12/24/22 for the exam, diagnosis, procedures, and orders are all accurate and complete.

## 2022-12-19 ENCOUNTER — Other Ambulatory Visit: Payer: Medicare Other

## 2022-12-19 ENCOUNTER — Telehealth: Payer: Self-pay | Admitting: Internal Medicine

## 2022-12-19 DIAGNOSIS — Z136 Encounter for screening for cardiovascular disorders: Secondary | ICD-10-CM

## 2022-12-19 DIAGNOSIS — Z0000 Encounter for general adult medical examination without abnormal findings: Secondary | ICD-10-CM | POA: Diagnosis not present

## 2022-12-19 DIAGNOSIS — Z13220 Encounter for screening for lipoid disorders: Secondary | ICD-10-CM

## 2022-12-19 DIAGNOSIS — M797 Fibromyalgia: Secondary | ICD-10-CM

## 2022-12-19 DIAGNOSIS — R42 Dizziness and giddiness: Secondary | ICD-10-CM

## 2022-12-19 DIAGNOSIS — R5383 Other fatigue: Secondary | ICD-10-CM | POA: Diagnosis not present

## 2022-12-19 LAB — CBC WITH DIFFERENTIAL/PLATELET
Basophils Absolute: 19 cells/uL (ref 0–200)
MCH: 30.5 pg (ref 27.0–33.0)
Neutro Abs: 1820 cells/uL (ref 1500–7800)
Neutrophils Relative %: 47.9 %
Platelets: 203 10*3/uL (ref 140–400)
WBC: 3.8 10*3/uL (ref 3.8–10.8)

## 2022-12-19 NOTE — Telephone Encounter (Signed)
LVM for patient to call back. ?

## 2022-12-19 NOTE — Telephone Encounter (Signed)
Patient came in and has CPE labs today and has CPE Friday at 10am, She said she has to get a nerve block done at 9:45 that day and wanted to know if the appointment could be moved back by an hour or what she could do. Please advise. Call back is [phone]

## 2022-12-19 NOTE — Telephone Encounter (Signed)
Patient called back and said that message can be disregarded, She talked to her neurologist and they moved her nerve block to Thursday so she's good to come in Friday still

## 2022-12-20 LAB — COMPLETE METABOLIC PANEL WITH GFR
AG Ratio: 1.8 (calc) (ref 1.0–2.5)
ALT: 23 U/L (ref 6–29)
AST: 18 U/L (ref 10–35)
Albumin: 4.2 g/dL (ref 3.6–5.1)
Alkaline phosphatase (APISO): 92 U/L (ref 37–153)
BUN: 25 mg/dL (ref 7–25)
CO2: 29 mmol/L (ref 20–32)
Calcium: 9.6 mg/dL (ref 8.6–10.4)
Chloride: 105 mmol/L (ref 98–110)
Creat: 0.6 mg/dL (ref 0.60–1.00)
Globulin: 2.3 g/dL (calc) (ref 1.9–3.7)
Glucose, Bld: 92 mg/dL (ref 65–99)
Potassium: 4.4 mmol/L (ref 3.5–5.3)
Sodium: 142 mmol/L (ref 135–146)
Total Bilirubin: 0.5 mg/dL (ref 0.2–1.2)
Total Protein: 6.5 g/dL (ref 6.1–8.1)
eGFR: 95 mL/min/{1.73_m2} (ref >=60)

## 2022-12-20 LAB — CBC WITH DIFFERENTIAL/PLATELET
Absolute Monocytes: 312 cells/uL (ref 200–950)
Basophils Relative: 0.5 %
Eosinophils Absolute: 61 cells/uL (ref 15–500)
Eosinophils Relative: 1.6 %
HCT: 41.6 % (ref 35.0–45.0)
Hemoglobin: 13.9 g/dL (ref 11.7–15.5)
Lymphs Abs: 1588 cells/uL (ref 850–3900)
MCHC: 33.4 g/dL (ref 32.0–36.0)
MCV: 91.2 fL (ref 80.0–100.0)
MPV: 9.2 fL (ref 7.5–12.5)
Monocytes Relative: 8.2 %
RBC: 4.56 10*6/uL (ref 3.80–5.10)
RDW: 12.6 % (ref 11.0–15.0)
Total Lymphocyte: 41.8 %

## 2022-12-20 LAB — LIPID PANEL
Cholesterol: 199 mg/dL (ref <200)
HDL: 78 mg/dL (ref >=50)
LDL Cholesterol (Calc): 105 mg/dL (calc) — ABNORMAL HIGH
Non-HDL Cholesterol (Calc): 121 mg/dL (calc) (ref <130)
Total CHOL/HDL Ratio: 2.6 (calc) (ref <5.0)
Triglycerides: 70 mg/dL (ref <150)

## 2022-12-20 LAB — TSH: TSH: 2.39 mIU/L (ref 0.40–4.50)

## 2022-12-22 ENCOUNTER — Ambulatory Visit (INDEPENDENT_AMBULATORY_CARE_PROVIDER_SITE_OTHER): Payer: Medicare Other | Admitting: Internal Medicine

## 2022-12-22 ENCOUNTER — Encounter: Payer: Self-pay | Admitting: Internal Medicine

## 2022-12-22 VITALS — BP 134/78 | HR 75 | Temp 97.7°F | Ht 65.5 in | Wt 179.0 lb

## 2022-12-22 DIAGNOSIS — M8580 Other specified disorders of bone density and structure, unspecified site: Secondary | ICD-10-CM

## 2022-12-22 DIAGNOSIS — Z23 Encounter for immunization: Secondary | ICD-10-CM

## 2022-12-22 DIAGNOSIS — M170 Bilateral primary osteoarthritis of knee: Secondary | ICD-10-CM

## 2022-12-22 DIAGNOSIS — R0602 Shortness of breath: Secondary | ICD-10-CM | POA: Diagnosis not present

## 2022-12-22 DIAGNOSIS — Z8669 Personal history of other diseases of the nervous system and sense organs: Secondary | ICD-10-CM | POA: Diagnosis not present

## 2022-12-22 DIAGNOSIS — Z0000 Encounter for general adult medical examination without abnormal findings: Secondary | ICD-10-CM

## 2022-12-22 LAB — POCT URINALYSIS DIPSTICK
Bilirubin, UA: NEGATIVE
Blood, UA: NEGATIVE
Glucose, UA: NEGATIVE
Ketones, UA: NEGATIVE
Leukocytes, UA: NEGATIVE
Nitrite, UA: NEGATIVE
Protein, UA: NEGATIVE
Spec Grav, UA: 1.01 (ref 1.010–1.025)
Urobilinogen, UA: 0.2 E.U./dL
pH, UA: 6 (ref 5.0–8.0)

## 2022-12-24 NOTE — Patient Instructions (Addendum)
It was a pleasure to see you today. Pneumococcal 20 vaccine given today. RTC in one year or as needed.

## 2023-01-11 ENCOUNTER — Other Ambulatory Visit: Payer: Self-pay | Admitting: Physician Assistant

## 2023-01-11 NOTE — Telephone Encounter (Signed)
Last Fill: 11/20/2022  Next Visit: 05/24/2023  Last Visit: 3/252024  Dx: Trapezius muscle spasm   Current Dose per office note on 11/20/2022: methocarbamol 500 mg 1 tablet daily as needed for muscle spasms   Okay to refill Robaxin?

## 2023-01-15 ENCOUNTER — Encounter: Payer: Self-pay | Admitting: Internal Medicine

## 2023-01-15 NOTE — Assessment & Plan Note (Signed)
Modafinil is working well for residual hypersomnia. Plan-refill modafinil.  Okay to continue splitting the dose.

## 2023-01-15 NOTE — Assessment & Plan Note (Signed)
Benefits from CPAP with good compliance and control Plan-continue auto 5-20 

## 2023-01-30 DIAGNOSIS — G43109 Migraine with aura, not intractable, without status migrainosus: Secondary | ICD-10-CM | POA: Diagnosis not present

## 2023-01-30 DIAGNOSIS — M47812 Spondylosis without myelopathy or radiculopathy, cervical region: Secondary | ICD-10-CM | POA: Diagnosis not present

## 2023-01-30 DIAGNOSIS — G894 Chronic pain syndrome: Secondary | ICD-10-CM | POA: Diagnosis not present

## 2023-01-30 DIAGNOSIS — Z79891 Long term (current) use of opiate analgesic: Secondary | ICD-10-CM | POA: Diagnosis not present

## 2023-02-15 ENCOUNTER — Telehealth: Payer: Self-pay | Admitting: Internal Medicine

## 2023-02-15 NOTE — Telephone Encounter (Signed)
Kari Flynn [phone]  Pam called to say that Dr Thyra Breed her neurologist would like for you to order labs to rule out rheumatory arthritis. She is having swelling in her hands.  AMA, C reactive Protein, RF, ESR  I let her know she may need to come in and talk with you about these this and get these labs so if they come back positive, you can do the referral.

## 2023-02-15 NOTE — Telephone Encounter (Signed)
Appointment scheduled.

## 2023-02-20 ENCOUNTER — Encounter: Payer: Self-pay | Admitting: Internal Medicine

## 2023-02-20 ENCOUNTER — Ambulatory Visit (INDEPENDENT_AMBULATORY_CARE_PROVIDER_SITE_OTHER): Payer: Medicare Other | Admitting: Internal Medicine

## 2023-02-20 VITALS — BP 126/74 | HR 69 | Temp 98.1°F | Resp 16 | Ht 65.5 in | Wt 180.5 lb

## 2023-02-20 DIAGNOSIS — M2550 Pain in unspecified joint: Secondary | ICD-10-CM

## 2023-02-20 DIAGNOSIS — R768 Other specified abnormal immunological findings in serum: Secondary | ICD-10-CM | POA: Diagnosis not present

## 2023-02-20 DIAGNOSIS — Z136 Encounter for screening for cardiovascular disorders: Secondary | ICD-10-CM | POA: Diagnosis not present

## 2023-02-20 NOTE — Progress Notes (Addendum)
Patient Care Team: Margaree Mackintosh, MD as PCP - General (Internal Medicine)  Visit Date: 02/20/23  Subjective:    Patient ID: Kari Flynn , Female   DOB: Apr 29, 1951, 72 y.o.    MRN: [id number]   72 y.o. Female presents today for pain and swelling in right hand. History of osteoarthritis both hands, bilateral DIP and PIP thickening with DIP subluxation. History of fibromyalgia. Denies hot joints, knee swelling. History of cervical spondylosis without myelopathy. She has been having increased neck pain. 05/18/22 X-ray C spine showed multilevel spondylosis and facet joint arthropathy. Most significant narrowing noted between C5 and C6. She is doing PT for this. Seen by Dr. Corliss Skains, Rheumatologist.  Past Medical History:  Diagnosis Date   Arthritis    Dyspnea    Lump of breast, left 11/08/2016   scheduled for a diagnostic/US mammo bilateral and stereo, US aspiration and ductogram.    Migraine, unspecified, without mention of intractable migraine without mention of status migrainosus    Myalgia and myositis, unspecified    Obstructive sleep apnea (adult) (pediatric)      Family History  Problem Relation Age of Onset   Hypertension Mother    Arthritis Mother    Hyperlipidemia Mother    Melanoma Mother    Diverticulitis Mother    Multiple myeloma Father    Arthritis Other        sibling   Sarcoidosis Other        sibling-also has NIDDM   Diabetes Brother    Sarcoidosis Brother    Heart attack Brother    Chronic Renal Failure Brother    Diabetes Paternal Grandfather    Colon cancer Neg Hx    Esophageal cancer Neg Hx    Rectal cancer Neg Hx    Stomach cancer Neg Hx     Sees Dr. Thyra Breed for chronic pain management. Hx osteoarthritis and cervicalgia. Has had PT with Art Schulenklopper.     Review of Systems  Constitutional:  Negative for fever and malaise/fatigue.  HENT:  Negative for congestion.   Eyes:  Negative for blurred vision.  Respiratory:  Negative for  cough and shortness of breath.   Cardiovascular:  Negative for chest pain, palpitations and leg swelling.  Gastrointestinal:  Negative for vomiting.  Musculoskeletal:  Positive for joint pain, myalgias and neck pain. Negative for back pain.  Skin:  Negative for rash.  Neurological:  Negative for loss of consciousness and headaches.        Objective:   Vitals: BP 126/74   Pulse 69   Temp 98.1 F (36.7 C) (Tympanic)   Resp 16   Ht 5' 5.5" (1.664 m)   Wt 180 lb 8 oz (81.9 kg)   SpO2 96%   BMI 29.58 kg/m    Physical Exam Vitals and nursing note reviewed.  Constitutional:      General: She is not in acute distress.    Appearance: Normal appearance. She is not toxic-appearing.  HENT:     Head: Normocephalic and atraumatic.  Pulmonary:     Effort: Pulmonary effort is normal.  Musculoskeletal:     Comments: Prominence of left and right second MCP joints, third left MCP joint. Some Heberden's and Bouchard's nodes bilateral hands.  Skin:    General: Skin is warm and dry.  Neurological:     Mental Status: She is alert and oriented to person, place, and time. Mental status is at baseline.  Psychiatric:  Mood and Affect: Mood normal.        Behavior: Behavior normal.        Thought Content: Thought content normal.        Judgment: Judgment normal.       Results:   Studies obtained and personally reviewed by me:   Labs:       Component Value Date/Time   NA 142 12/19/2022 0920   K 4.4 12/19/2022 0920   CL 105 12/19/2022 0920   CO2 29 12/19/2022 0920   GLUCOSE 92 12/19/2022 0920   BUN 25 12/19/2022 0920   CREATININE 0.60 12/19/2022 0920   CALCIUM 9.6 12/19/2022 0920   PROT 6.5 12/19/2022 0920   ALBUMIN 4.1 10/27/2016 1158   AST 18 12/19/2022 0920   ALT 23 12/19/2022 0920   ALKPHOS 85 10/27/2016 1158   BILITOT 0.5 12/19/2022 0920   GFRNONAA 83 12/06/2020 0940   GFRAA 96 12/06/2020 0940     Lab Results  Component Value Date   WBC 3.8 12/19/2022   HGB  13.9 12/19/2022   HCT 41.6 12/19/2022   MCV 91.2 12/19/2022   PLT 203 12/19/2022    Lab Results  Component Value Date   CHOL 199 12/19/2022   HDL 78 12/19/2022   LDLCALC 105 (H) 12/19/2022   TRIG 70 12/19/2022   CHOLHDL 2.6 12/19/2022    No results found for: "HGBA1C"   Lab Results  Component Value Date   TSH 2.39 12/19/2022      Assessment & Plan:   Hand arthritis: ordered sed rate, Rheumatoid factor, Cyclic citrul peptide antibody, IgG, ANA, C reactive protein.Patient would like to exclude Rheumatoid Arthritis. I think this is likely osteoarthritis.  Ordered coronary calcium score with Family hx of heart disease.Recent LDL was 105. We have been reluctant to prescribe a statin due to musculoskeletal pain and LDL was not all that high. Coronary calcium score will help [place] to determine if a statin is needed.    I,Alexander Ruley,acting as a Neurosurgeon for Margaree Mackintosh, MD.,have documented all relevant documentation on the behalf of Margaree Mackintosh, MD,as directed by  Margaree Mackintosh, MD while in the presence of Margaree Mackintosh, MD.   I, Margaree Mackintosh, MD, have reviewed all documentation for this visit. The documentation on 02/20/23 for the exam, diagnosis, procedures, and orders are all accurate and complete.

## 2023-02-20 NOTE — Patient Instructions (Addendum)
Coronary calcium score ordered along with labs she requested plus CCP for Rheumatoid arthritis.

## 2023-02-21 LAB — C-REACTIVE PROTEIN: CRP: 3.9 mg/L (ref <8.0)

## 2023-02-21 LAB — CYCLIC CITRUL PEPTIDE ANTIBODY, IGG: Cyclic Citrullin Peptide Ab: <16 UNITS

## 2023-02-21 LAB — SEDIMENTATION RATE: Sed Rate: 14 mm/h (ref 0–30)

## 2023-02-22 ENCOUNTER — Encounter: Payer: Self-pay | Admitting: Anesthesiology

## 2023-02-22 ENCOUNTER — Other Ambulatory Visit: Payer: Self-pay | Admitting: Anesthesiology

## 2023-02-22 DIAGNOSIS — M542 Cervicalgia: Secondary | ICD-10-CM

## 2023-02-22 LAB — ANTI-NUCLEAR AB-TITER (ANA TITER)
ANA TITER: 1:80 {titer} — ABNORMAL HIGH
ANA Titer 1: 1:80 {titer} — ABNORMAL HIGH

## 2023-02-26 ENCOUNTER — Ambulatory Visit
Admission: RE | Admit: 2023-02-26 | Discharge: 2023-02-26 | Disposition: A | Discharge status: Home / Self Care | Payer: Medicare Other | Source: Ambulatory Visit | Attending: Anesthesiology | Admitting: Anesthesiology

## 2023-02-26 ENCOUNTER — Other Ambulatory Visit: Payer: Self-pay | Admitting: Anesthesiology

## 2023-02-26 ENCOUNTER — Ambulatory Visit
Admission: RE | Admit: 2023-02-26 | Discharge: 2023-02-26 | Disposition: A | Discharge status: Home / Self Care | Payer: Medicare Other | Source: Ambulatory Visit | Attending: Internal Medicine | Admitting: Internal Medicine

## 2023-02-26 DIAGNOSIS — M4802 Spinal stenosis, cervical region: Secondary | ICD-10-CM | POA: Diagnosis not present

## 2023-02-26 DIAGNOSIS — M542 Cervicalgia: Secondary | ICD-10-CM

## 2023-02-26 DIAGNOSIS — M4722 Other spondylosis with radiculopathy, cervical region: Secondary | ICD-10-CM | POA: Diagnosis not present

## 2023-02-26 DIAGNOSIS — R0602 Shortness of breath: Secondary | ICD-10-CM | POA: Diagnosis not present

## 2023-02-27 LAB — ANA: Anti Nuclear Antibody (ANA): POSITIVE — AB

## 2023-02-27 LAB — ANTI-DNA ANTIBODY, DOUBLE-STRANDED: ds DNA Ab: <1 IU/mL

## 2023-02-27 LAB — RHEUMATOID FACTOR: Rheumatoid fact SerPl-aCnc: <10 IU/mL (ref <14)

## 2023-03-14 DIAGNOSIS — G43109 Migraine with aura, not intractable, without status migrainosus: Secondary | ICD-10-CM | POA: Diagnosis not present

## 2023-03-14 DIAGNOSIS — M47812 Spondylosis without myelopathy or radiculopathy, cervical region: Secondary | ICD-10-CM | POA: Diagnosis not present

## 2023-03-14 DIAGNOSIS — G894 Chronic pain syndrome: Secondary | ICD-10-CM | POA: Diagnosis not present

## 2023-03-14 DIAGNOSIS — Z79891 Long term (current) use of opiate analgesic: Secondary | ICD-10-CM | POA: Diagnosis not present

## 2023-03-15 ENCOUNTER — Ambulatory Visit (HOSPITAL_BASED_OUTPATIENT_CLINIC_OR_DEPARTMENT_OTHER)
Admission: RE | Admit: 2023-03-15 | Discharge: 2023-03-15 | Disposition: A | Discharge status: Home / Self Care | Payer: Medicare Other | Source: Ambulatory Visit | Attending: Internal Medicine | Admitting: Internal Medicine

## 2023-03-15 ENCOUNTER — Encounter (HOSPITAL_BASED_OUTPATIENT_CLINIC_OR_DEPARTMENT_OTHER): Payer: Self-pay

## 2023-03-15 DIAGNOSIS — Z136 Encounter for screening for cardiovascular disorders: Secondary | ICD-10-CM | POA: Insufficient documentation

## 2023-03-15 NOTE — Progress Notes (Signed)
Referral has been placed. 

## 2023-03-27 DIAGNOSIS — M4712 Other spondylosis with myelopathy, cervical region: Secondary | ICD-10-CM | POA: Diagnosis not present

## 2023-03-27 DIAGNOSIS — M4802 Spinal stenosis, cervical region: Secondary | ICD-10-CM | POA: Diagnosis not present

## 2023-03-27 DIAGNOSIS — M542 Cervicalgia: Secondary | ICD-10-CM | POA: Diagnosis not present

## 2023-04-09 DIAGNOSIS — G43109 Migraine with aura, not intractable, without status migrainosus: Secondary | ICD-10-CM | POA: Diagnosis not present

## 2023-04-09 DIAGNOSIS — Z79891 Long term (current) use of opiate analgesic: Secondary | ICD-10-CM | POA: Diagnosis not present

## 2023-04-09 DIAGNOSIS — M47812 Spondylosis without myelopathy or radiculopathy, cervical region: Secondary | ICD-10-CM | POA: Diagnosis not present

## 2023-04-09 DIAGNOSIS — G894 Chronic pain syndrome: Secondary | ICD-10-CM | POA: Diagnosis not present

## 2023-04-10 NOTE — Progress Notes (Deleted)
[address]       [place] [address]             [phone]   PCP is Baxley, Luanna Cole, MD Referring Provider is Baxley, Luanna Cole, MD  Chief Complaint: Ascending thoracic aortic aneurysm   HPI: This is a 72 year old female with a past medical history of arthritis, OSA, myalgia/myositis who was incidentally was found on CT coronary calcium scan to have an a mildly dilated ascending aorta measuring 38mm (3.8 cm) at the bifurcation of the main pulmonary artery. She presents today to  establish further surveillance of the ATAA. She denies chest pain, pressure, or tightness.  Past Medical History:  Diagnosis Date   Arthritis    Dyspnea    Lump of breast, left 11/08/2016   scheduled for a diagnostic/US mammo bilateral and stereo, US aspiration and ductogram.    Migraine, unspecified, without mention of intractable migraine without mention of status migrainosus    Myalgia and myositis, unspecified    Obstructive sleep apnea (adult) (pediatric)     Past Surgical History:  Procedure Laterality Date   BLEPHAROPLASTY     BREAST BIOPSY     B9   BUNIONECTOMY     x2   CATARACT EXTRACTION, BILATERAL     01/2020, 02/2020   REFRACTIVE SURGERY Right 04/2022   laser eye surgery   WISDOM TOOTH EXTRACTION      Family History  Problem Relation Age of Onset   Hypertension Mother    Arthritis Mother    Hyperlipidemia Mother    Melanoma Mother    Diverticulitis Mother    Multiple myeloma Father    Arthritis Other        sibling   Sarcoidosis Other        sibling-also has NIDDM   Diabetes Brother    Sarcoidosis Brother    Heart attack Brother    Chronic Renal Failure Brother    Diabetes Paternal Grandfather    Colon cancer Neg Hx    Esophageal cancer Neg Hx    Rectal cancer Neg Hx    Stomach cancer Neg Hx     Social History Social History   Tobacco Use   Smoking status: Former    Current packs/day: 0.00    Average packs/day: 0.5 packs/day for 10.0 years  (5.0 ttl pk-yrs)    Types: Cigarettes    Start date: 77    Quit date: 08/28/1977    Years since quitting: 45.6    Passive exposure: Never   Smokeless tobacco: Never  Vaping Use   Vaping status: Never Used  Substance Use Topics   Alcohol use: Yes    Comment: occ   Drug use: Never    Current Outpatient Medications  Medication Sig Dispense Refill   Acetaminophen (TYLENOL PO) Take by mouth as needed.     Alpha-Lipoic Acid 200 MG CAPS Take by mouth.     aspirin 81 MG tablet Take 81 mg by mouth daily.     B Complex-Biotin-FA (B-COMPLEX PO) Take by mouth.     Calcium Carbonate-Vit D-Min (CALCIUM 1200 PO) Take by mouth.     Cholecalciferol (VITAMIN D) 2000 UNITS CAPS Take 1 capsule by mouth daily.     diclofenac Sodium (VOLTAREN) 1 % GEL Apply 2-4 grams to affected joint 4 times daily as needed. 400 g 2   FIORICET 50-300-40 MG CAPS Take 1 capsule by mouth as needed.     fluticasone (FLONASE)  50 MCG/ACT nasal spray Place 1 spray into both nostrils as needed for allergies or rhinitis.     hyoscyamine (LEVSIN SL) 0.125 MG SL tablet PLACE UNDER TONGUE ONE HALF HOUR BEFORE MEALS AS NEEDED 90 tablet 3   meclizine (ANTIVERT) 25 MG tablet Take 1 tablet (25 mg total) by mouth 3 (three) times daily as needed for dizziness. 30 tablet 0   methadone (DOLOPHINE) 5 MG tablet Take 5 mg by mouth every 8 (eight) hours.     methocarbamol (ROBAXIN) 500 MG tablet TAKE 1 TABLET (500 MG TOTAL) BY MOUTH DAILY AS NEEDED FOR MUSCLE SPASMS. 30 tablet 0   MISC NATURAL PRODUCTS PO Take 2 tablets by mouth daily. Avocado300 Soy Unsaponifiables     modafinil (PROVIGIL) 200 MG tablet TAKE ONE TABLET BY MOUTH DAILY AS NEEDED FOR ALERTNESS 30 tablet 5   Multiple Vitamins-Minerals (OCUVITE ADULT 50+ PO) Take by mouth.     naproxen sodium (ALEVE) 220 MG tablet Take 220 mg by mouth daily as needed (pain). Take as directed as needed     polyethylene glycol powder (GLYCOLAX/MIRALAX) 17 GM/SCOOP powder Take by mouth.     TURMERIC  CURCUMIN PO Take by mouth in the morning and at bedtime.      Allergies  Allergen Reactions   Divalproex Sodium Other (See Comments)    Severe diarrhea & extreme hair loss   Meloxicam     Hair loss    Valproic Acid     Hair loss    Review of Systems  Chest Pain [  ] Resting SOB [ ]  Exertional SOB [  ]  Pedal Edema [  ] Syncope [  ] Presyncope [  ]  General Review of Systems: [Y] = yes [ N]=no  Consitutional:   nausea [ ] ;  fever [ ] ;  Eye : blurred vision [ ] ; Amaurosis fugax[  ];  Resp: cough [ ] ;  hemoptysis[ ] ;  GI: vomiting[ ] ; melena[ ] ; hematochezia [] ;  [id number] ; Musculoskeletal: myalgias[ ] ; joint swelling[  ]; joint erythema[ ] ;  Heme/Lymph: anemia[ ] ;  Neuro: TIA[ ] ;stroke[ ] ;  seizures[ ] ;  Endocrine: diabetes[ ] ;   Vital Signs:  Physical Exam CV Neck Pulmonary Abdomen Extremities Neurologic   Diagnostic Tests:   Risk Modification in those with ascending thoracic aortic aneurysm:  Continue good control of blood pressure (prefer SBP 130/80 or less)-she is not on any BP medication  2. Avoid fluoroquinolone antibiotics (I.e Ciprofloxacin, Avelox, Levofloxacin, Ofloxacin)  3.  Use of statin (to decrease cardiovascular risk)-will defer to primary if needed as patient with a history of myalgias/myositis  4.  Exercise and activity limitations is individualized, but in general, contact sports are to be  avoided and one should avoid heavy lifting (defined as half of ideal body weight) and exercises involving sustained Valsalva maneuver.  5. Counseling for those suspected of having genetically mediated disease. First-degree relatives of those with TAA disease should be screened as well as those who have a connective tissue disease (I.e with Marfan syndrome, Ehlers-Danlos syndrome,  and Loeys-Dietz syndrome) or a  bicuspid aortic valve,have an increased risk for  complications related to TAA. Patient has no family history of connective tissue disease  and has not had an echo  6.Patient with remote history of tobacco abuse, quit 1979   Impression and Plan: CR coronary calcium scan with a 3.8 cm ascending aortic aneurysm.  She has not had an echocardiogram.  We discussed the natural history and and risk factors for  growth of ascending aortic aneurysms.  We covered the importance of smoking cessation, tight blood pressure control, refraining from lifting heavy objects, and avoiding fluoroquinolones.  The patient is aware of signs and symptoms of aortic dissection and when to present to the emergency department.  We will continue surveillance and a repeat CTA was ordered for 1 year.   Ardelle Balls, PA-C [facility] [phone]

## 2023-04-16 DIAGNOSIS — M50022 Cervical disc disorder at C5-C6 level with myelopathy: Secondary | ICD-10-CM | POA: Diagnosis not present

## 2023-04-16 DIAGNOSIS — M4802 Spinal stenosis, cervical region: Secondary | ICD-10-CM | POA: Diagnosis not present

## 2023-04-16 DIAGNOSIS — M4722 Other spondylosis with radiculopathy, cervical region: Secondary | ICD-10-CM | POA: Diagnosis not present

## 2023-04-16 DIAGNOSIS — M4712 Other spondylosis with myelopathy, cervical region: Secondary | ICD-10-CM | POA: Diagnosis not present

## 2023-04-16 HISTORY — PX: ANTERIOR CERVICAL DECOMP/DISCECTOMY FUSION: SHX1161

## 2023-05-08 DIAGNOSIS — Z6829 Body mass index (BMI) 29.0-29.9, adult: Secondary | ICD-10-CM | POA: Diagnosis not present

## 2023-05-08 DIAGNOSIS — M4802 Spinal stenosis, cervical region: Secondary | ICD-10-CM | POA: Diagnosis not present

## 2023-05-08 DIAGNOSIS — M4712 Other spondylosis with myelopathy, cervical region: Secondary | ICD-10-CM | POA: Diagnosis not present

## 2023-05-11 NOTE — Progress Notes (Signed)
Office Visit Note  Patient: Kari Flynn             Date of Birth: 10-21-1950           MRN: [id number]             PCP: Margaree Mackintosh, MD Referring: Margaree Mackintosh, MD Visit Date: 05/24/2023 Occupation: @GUAROCC @  Subjective:  Neck pain   History of Present Illness: Kari Flynn is a 72 y.o. female with osteoarthritis, degenerative disc disease, fibromyalgia and osteopenia.  She states she had cervical spine fusion by Dr. Lovell Sheehan on August 18th 2024.  She is having gradual recovery.  She still have some pain and discomfort in her neck and muscle spasm.  She was taking Flexeril but as she was experiencing dizziness she stopped Flexeril.  She tried Tylenol and heating pad but it was not sufficient.  She states she will have to restart muscle relaxers.  She discontinued methocarbamol.   Trochanteric bursitis is improved.  She continues to have some stiffness in her hands with activities.  She states recently she has not been yard work so her hands have not been bothering her much.  She has intermittent pain in her knee joints.  She had good response to Euflexxa injections in April.  She continues to have some generalized pain and discomfort from fibromyalgia.    Activities of Daily Living:  Patient reports morning stiffness for 2 hours.   Patient Denies nocturnal pain.  Difficulty dressing/grooming: Denies Difficulty climbing stairs: Denies Difficulty getting out of chair: Denies Difficulty using hands for taps, buttons, cutlery, and/or writing: Denies  Review of Systems  Constitutional:  Positive for fatigue.  HENT:  Negative for mouth sores and mouth dryness.   Eyes:  Negative for dryness.  Respiratory:  Negative for shortness of breath.   Cardiovascular:  Negative for chest pain and palpitations.  Gastrointestinal:  Negative for blood in stool, constipation and diarrhea.  Endocrine: Negative for increased urination.  Genitourinary:  Negative for involuntary urination.   Musculoskeletal:  Positive for joint pain, joint pain, myalgias, morning stiffness and myalgias. Negative for gait problem, joint swelling, muscle weakness and muscle tenderness.  Skin:  Positive for hair loss. Negative for color change, rash and sensitivity to sunlight.  Allergic/Immunologic: Negative for susceptible to infections.  Neurological:  Positive for dizziness. Negative for headaches.  Hematological:  Negative for swollen glands.  Psychiatric/Behavioral:  Negative for depressed mood and sleep disturbance. The patient is not nervous/anxious.     PMFS History:  Patient Active Problem List   Diagnosis Date Noted   Primary osteoarthritis of both hands 05/18/2021   Cervical spondylosis without myelopathy 01/12/2016   Primary osteoarthritis of first carpometacarpal joint of left hand 01/12/2016   Hypersomnia with sleep apnea 08/14/2014   Osteoarthritis of both knees 03/22/2014   Unspecified constipation 10/16/2013   Insomnia 06/23/2010   Obstructive sleep apnea 06/25/2008   Migraine headache 06/25/2008   Fibromyalgia 06/25/2008   Shortness of breath 06/25/2008    Past Medical History:  Diagnosis Date   Arthritis    Dyspnea    Lump of breast, left 11/08/2016   scheduled for a diagnostic/US mammo bilateral and stereo, US aspiration and ductogram.    Migraine, unspecified, without mention of intractable migraine without mention of status migrainosus    Myalgia and myositis, unspecified    Obstructive sleep apnea (adult) (pediatric)    Vertigo     Family History  Problem Relation Age of Onset  Hypertension Mother    Arthritis Mother    Hyperlipidemia Mother    Melanoma Mother    Diverticulitis Mother    Multiple myeloma Father    Arthritis Other        sibling   Sarcoidosis Other        sibling-also has NIDDM   Diabetes Brother    Sarcoidosis Brother    Heart attack Brother    Chronic Renal Failure Brother    Diabetes Paternal Grandfather    Colon cancer Neg Hx     Esophageal cancer Neg Hx    Rectal cancer Neg Hx    Stomach cancer Neg Hx    Past Surgical History:  Procedure Laterality Date   ANTERIOR CERVICAL DECOMP/DISCECTOMY FUSION  04/16/2023   BLEPHAROPLASTY     BREAST BIOPSY     B9   BUNIONECTOMY     x2   CATARACT EXTRACTION, BILATERAL     01/2020, 02/2020   REFRACTIVE SURGERY Right 04/2022   laser eye surgery   WISDOM TOOTH EXTRACTION     Social History   Social History Narrative   Not on file   Immunization History  Administered Date(s) Administered   Fluad Quad(high Dose 65+) 06/19/2019, 06/07/2021   Influenza Split 06/09/2011, 08/01/2012, 05/28/2017   Influenza Whole 06/24/2009, 06/07/2010   Influenza, High Dose Seasonal PF 07/13/2014, 07/06/2015, 05/31/2017, 07/05/2018, 07/29/2020   Influenza,inj,Quad PF,6+ Mos 08/01/2013   Influenza-Unspecified 07/25/2014, 07/06/2015, 06/06/2016, 06/16/2022   PFIZER(Purple Top)SARS-COV-2 Vaccination 10/06/2019, 10/30/2019, 05/12/2020   PNEUMOCOCCAL CONJUGATE-20 12/22/2022   Pneumococcal Conjugate-13 11/05/2014   Pneumococcal Polysaccharide-23 11/05/2017   Tdap 06/09/2011, 12/16/2020     Objective: Vital Signs: BP (!) 152/88 (BP Location: Left Arm, Patient Position: Sitting, Cuff Size: Large)   Pulse 78   Resp 13   Ht 5\' 5"  (1.651 m)   Wt 182 lb 12.8 oz (82.9 kg)   BMI 30.42 kg/m    Physical Exam Vitals and nursing note reviewed.  Constitutional:      Appearance: She is well-developed.  HENT:     Head: Normocephalic and atraumatic.  Eyes:     Conjunctiva/sclera: Conjunctivae normal.  Cardiovascular:     Rate and Rhythm: Normal rate and regular rhythm.     Heart sounds: Normal heart sounds.  Pulmonary:     Effort: Pulmonary effort is normal.     Breath sounds: Normal breath sounds.  Abdominal:     General: Bowel sounds are normal.     Palpations: Abdomen is soft.  Musculoskeletal:     Cervical back: Normal range of motion.  Lymphadenopathy:     Cervical: No cervical  adenopathy.  Skin:    General: Skin is warm and dry.     Capillary Refill: Capillary refill takes less than 2 seconds.  Neurological:     Mental Status: She is alert and oriented to person, place, and time.  Psychiatric:        Behavior: Behavior normal.      Musculoskeletal Exam: Patient had limited painful range of motion of the cervical spine.  She had bilateral trapezius spasm.  She had discomfort with forward flexion and abduction of her shoulders due to his recent cervical spine surgery.  Elbow joints and wrist joints in good range of motion.  She has thickening of bilateral second MCPs but no synovitis was noted.  CMC, PIP and DIP thickening with no synovitis was noted.  Hip joints and knee joints in good range of motion without any warmth swelling or effusion.  There was no tenderness over ankles or MTPs.  CDAI Exam: CDAI Score: -- Patient Global: --; Provider Global: -- Swollen: --; Tender: -- Joint Exam 05/24/2023   No joint exam has been documented for this visit   There is currently no information documented on the homunculus. Go to the Rheumatology activity and complete the homunculus joint exam.  Investigation: No additional findings.  Imaging: No results found.  Recent Labs: Lab Results  Component Value Date   WBC 3.8 12/19/2022   HGB 13.9 12/19/2022   PLT 203 12/19/2022   NA 142 12/19/2022   K 4.4 12/19/2022   CL 105 12/19/2022   CO2 29 12/19/2022   GLUCOSE 92 12/19/2022   BUN 25 12/19/2022   CREATININE 0.60 12/19/2022   BILITOT 0.5 12/19/2022   ALKPHOS 85 10/27/2016   AST 18 12/19/2022   ALT 23 12/19/2022   PROT 6.5 12/19/2022   ALBUMIN 4.1 10/27/2016   CALCIUM 9.6 12/19/2022   GFRAA 96 12/06/2020    Speciality Comments: No specialty comments available.  Procedures:  No procedures performed Allergies: Divalproex sodium, Meloxicam, and Valproic acid   Assessment / Plan:     Visit Diagnoses: Primary osteoarthritis of both knees - s/p euflexxa  bilateral knees 10/2022-11/2022.  She had good response to viscosupplement injections.  She is currently not having much discomfort in her knee joints.  No warmth swelling or effusion was noted.  Lower extremity muscle strengthening exercises were emphasized.  Pain in both hands -she continues to have some pain and stiffness in her hands.  No synovitis was noted.  She is thickening of bilateral second MCP joints most likely related to yard work she has done over the years.  Patient states Dr. Vear Clock was concerned about possible rheumatoid arthritis.  I will obtain labs today.  I also obtained x-rays of the bilateral hands which showed degenerative changes.  X-rays were reviewed with the patient.  Bilateral second MCP narrowing was noted but no intercarpal radiocarpal joint space narrowing was noted.  No erosive changes were noted.  Plan: Sedimentation rate, Rheumatoid factor, Cyclic citrul peptide antibody, IgG, XR Hand 2 View Right, XR Hand 2 View Left.   Primary osteoarthritis of both hands- Joint protection muscle strengthening was discussed.  Trochanteric bursitis of both hips-she currently does not have any trochanteric bursitis although she has recurrent problems.  DDD (degenerative disc disease), cervical - S/P ACDF, C5-C6 and C6-C7 fusion by Dr. Lovell Sheehan April 16, 2023.  She continues to have some pain and stiffness after surgery.  Trapezius muscle spasm-she had bilateral trapezius spasm.  She has been using Flexeril at bedtime which has been helpful.  She has discontinued methocarbamol.  Fibromyalgia-she continues to have some generalized pain and discomfort from fibromyalgia.  She had hyperalgesia and positive tender points.  Need for regular stretching exercises were discussed.  History of insomnia-good sleep hygiene was discussed.  History of fatigue-related to fibromyalgia and insomnia.  Osteopenia of multiple sites - DEXA 09/01/21: The BMD measured at Forearm Radius 33% is 0.733 g/cm2  with a T-scoreof -1.6.  Calcium rich diet and exercise was emphasized.  Patient will resume exercise after her neck symptoms improved.  Vitamin D deficiency-she can use to take vitamin D supplement.  History of sleep apnea  History of migraine  History of depression   Orders: Orders Placed This Encounter  Procedures   XR Hand 2 View Right   XR Hand 2 View Left   Sedimentation rate   Rheumatoid factor  Cyclic citrul peptide antibody, IgG   No orders of the defined types were placed in this encounter.    Follow-Up Instructions: Return in about 6 months (around 11/21/2023) for Osteoarthritis.   Pollyann Savoy, MD  Note - This record has been created using Animal nutritionist.  Chart creation errors have been sought, but may not always  have been located. Such creation errors do not reflect on  the standard of medical care.

## 2023-05-24 ENCOUNTER — Ambulatory Visit: Payer: Medicare Other

## 2023-05-24 ENCOUNTER — Ambulatory Visit: Payer: Medicare Other | Attending: Rheumatology | Admitting: Rheumatology

## 2023-05-24 ENCOUNTER — Encounter: Payer: Self-pay | Admitting: Rheumatology

## 2023-05-24 VITALS — BP 152/88 | HR 78 | Resp 13 | Ht 65.0 in | Wt 182.8 lb

## 2023-05-24 DIAGNOSIS — M7062 Trochanteric bursitis, left hip: Secondary | ICD-10-CM | POA: Diagnosis not present

## 2023-05-24 DIAGNOSIS — Z87898 Personal history of other specified conditions: Secondary | ICD-10-CM | POA: Insufficient documentation

## 2023-05-24 DIAGNOSIS — Z8669 Personal history of other diseases of the nervous system and sense organs: Secondary | ICD-10-CM | POA: Insufficient documentation

## 2023-05-24 DIAGNOSIS — M170 Bilateral primary osteoarthritis of knee: Secondary | ICD-10-CM | POA: Diagnosis not present

## 2023-05-24 DIAGNOSIS — Z8659 Personal history of other mental and behavioral disorders: Secondary | ICD-10-CM | POA: Insufficient documentation

## 2023-05-24 DIAGNOSIS — M19041 Primary osteoarthritis, right hand: Secondary | ICD-10-CM | POA: Diagnosis not present

## 2023-05-24 DIAGNOSIS — M62838 Other muscle spasm: Secondary | ICD-10-CM | POA: Insufficient documentation

## 2023-05-24 DIAGNOSIS — M79642 Pain in left hand: Secondary | ICD-10-CM | POA: Diagnosis not present

## 2023-05-24 DIAGNOSIS — M7061 Trochanteric bursitis, right hip: Secondary | ICD-10-CM | POA: Diagnosis not present

## 2023-05-24 DIAGNOSIS — M5030 Other cervical disc degeneration, unspecified cervical region: Secondary | ICD-10-CM | POA: Insufficient documentation

## 2023-05-24 DIAGNOSIS — M797 Fibromyalgia: Secondary | ICD-10-CM | POA: Insufficient documentation

## 2023-05-24 DIAGNOSIS — M47812 Spondylosis without myelopathy or radiculopathy, cervical region: Secondary | ICD-10-CM

## 2023-05-24 DIAGNOSIS — M79641 Pain in right hand: Secondary | ICD-10-CM

## 2023-05-24 DIAGNOSIS — M8589 Other specified disorders of bone density and structure, multiple sites: Secondary | ICD-10-CM | POA: Insufficient documentation

## 2023-05-24 DIAGNOSIS — E559 Vitamin D deficiency, unspecified: Secondary | ICD-10-CM | POA: Insufficient documentation

## 2023-05-24 DIAGNOSIS — M19042 Primary osteoarthritis, left hand: Secondary | ICD-10-CM | POA: Diagnosis not present

## 2023-05-24 LAB — SEDIMENTATION RATE: Sed Rate: 9 mm/h (ref 0–30)

## 2023-05-24 NOTE — Patient Instructions (Signed)

## 2023-05-26 LAB — CYCLIC CITRUL PEPTIDE ANTIBODY, IGG: Cyclic Citrullin Peptide Ab: <16 U

## 2023-05-26 LAB — RHEUMATOID FACTOR: Rheumatoid fact SerPl-aCnc: <10 [IU]/mL (ref <14)

## 2023-05-27 NOTE — Progress Notes (Signed)
Sedimentation rate normal, RF negative, anti-CCP negative.  Labs do not indicate any inflammation in the tests for rheumatoid arthritis are negative.

## 2023-06-05 DIAGNOSIS — G43109 Migraine with aura, not intractable, without status migrainosus: Secondary | ICD-10-CM | POA: Diagnosis not present

## 2023-06-05 DIAGNOSIS — Z79891 Long term (current) use of opiate analgesic: Secondary | ICD-10-CM | POA: Diagnosis not present

## 2023-06-05 DIAGNOSIS — M47812 Spondylosis without myelopathy or radiculopathy, cervical region: Secondary | ICD-10-CM | POA: Diagnosis not present

## 2023-06-05 DIAGNOSIS — G894 Chronic pain syndrome: Secondary | ICD-10-CM | POA: Diagnosis not present

## 2023-06-05 NOTE — Progress Notes (Addendum)
[address]       [place] [address]             [phone]   PCP is Baxley, Luanna Cole, MD Referring Provider is Lenord Fellers Luanna Cole, MD  Chief Complaint:Ascending aorta is upper normal in caliber at 3.8 cm.   HPI: This is a 72 year old female with a past medical history of OSA (CPAP), remote tobacco abuse, OA, and fibromyalgia who was Incidentally found during a coronary calcium scan to have an ascending aorta at the upper normal of 3.8 cm. Patient denies chest pain, pressure, tightness, or LE edema. She has limited mobility because of OA of her knees (she receives injections in them since 1999) and since she had neck surgery, she has had vertigo and exacerbation of her fibromyalgia.  Past Medical History:  Diagnosis Date   Arthritis    Dyspnea    Lump of breast, left 11/08/2016   scheduled for a diagnostic/US mammo bilateral and stereo, US aspiration and ductogram.    Migraine, unspecified, without mention of intractable migraine without mention of status migrainosus    Myalgia and myositis, unspecified    Obstructive sleep apnea (adult) (pediatric)    Vertigo     Past Surgical History:  Procedure Laterality Date   ANTERIOR CERVICAL DECOMP/DISCECTOMY FUSION  04/16/2023   BLEPHAROPLASTY     BREAST BIOPSY     B9   BUNIONECTOMY     x2   CATARACT EXTRACTION, BILATERAL     01/2020, 02/2020   REFRACTIVE SURGERY Right 04/2022   laser eye surgery   WISDOM TOOTH EXTRACTION      Family History  Problem Relation Age of Onset   Hypertension Mother    Arthritis Mother    Hyperlipidemia Mother    Melanoma Mother    Diverticulitis Mother    Multiple myeloma Father    Arthritis Other        sibling   Sarcoidosis Other        sibling-also has NIDDM   Diabetes Brother    Sarcoidosis Brother    Heart attack Brother    Chronic Renal Failure Brother    Diabetes Paternal Grandfather    Colon cancer Neg Hx    Esophageal cancer Neg Hx    Rectal cancer Neg Hx     Stomach cancer Neg Hx   Mother died age 62 Father died in his 39's  Social History Social History   Tobacco Use   Smoking status: Former    Current packs/day: 0.00    Average packs/day: 0.5 packs/day for 10.0 years (5.0 ttl pk-yrs)    Types: Cigarettes    Start date: 58    Quit date: 08/28/1977    Years since quitting: 45.8    Passive exposure: Never   Smokeless tobacco: Never  Vaping Use   Vaping status: Never Used  Substance Use Topics   Alcohol use: Yes-occasional glass of wine or beer    Comment: occ   Drug use: Never  She is married, has 2 children, and is a retired [employer] fundraiser  Current Outpatient Medications  Medication Sig Dispense Refill   Acetaminophen (TYLENOL PO) Take by mouth as needed.     Alpha-Lipoic Acid 200 MG CAPS Take by mouth.     aspirin 81 MG tablet Take 81 mg by mouth daily.     B Complex-Biotin-FA (B-COMPLEX PO) Take by mouth.     Calcium Carbonate-Vit D-Min (CALCIUM 1200 PO) Take by mouth.  Cholecalciferol (VITAMIN D) 2000 UNITS CAPS Take 1 capsule by mouth daily.     cyclobenzaprine (FLEXERIL) 5 MG tablet Take 5 mg by mouth every 8 (eight) hours as needed.     diclofenac Sodium (VOLTAREN) 1 % GEL Apply 2-4 grams to affected joint 4 times daily as needed. 400 g 2   FIORICET 50-300-40 MG CAPS Take 1 capsule by mouth as needed.     fluticasone (FLONASE) 50 MCG/ACT nasal spray Place 1 spray into both nostrils as needed for allergies or rhinitis.     hyoscyamine (LEVSIN SL) 0.125 MG SL tablet PLACE UNDER TONGUE ONE HALF HOUR BEFORE MEALS AS NEEDED 90 tablet 3   meclizine (ANTIVERT) 25 MG tablet Take 1 tablet (25 mg total) by mouth 3 (three) times daily as needed for dizziness. 30 tablet 0   methadone (DOLOPHINE) 5 MG tablet Take 5 mg by mouth every 8 (eight) hours.     MISC NATURAL PRODUCTS PO Take 2 tablets by mouth daily. Avocado300 Soy Unsaponifiables     modafinil (PROVIGIL) 200 MG tablet TAKE ONE TABLET BY MOUTH DAILY AS NEEDED FOR ALERTNESS 30 tablet 5    Multiple Vitamins-Minerals (OCUVITE ADULT 50+ PO) Take by mouth.     polyethylene glycol powder (GLYCOLAX/MIRALAX) 17 GM/SCOOP powder Take by mouth.     TURMERIC CURCUMIN PO Take by mouth in the morning and at bedtime.     Allergies  Allergen Reactions   Divalproex Sodium Other (See Comments)    Severe diarrhea & extreme hair loss   Meloxicam     Hair loss    Valproic Acid     Hair loss    Review of Systems  Chest Pain [ N ] Resting SOB Klaus.Mock ] Exertional SOB [ Sometimes ]  Pedal Edema [ N ] Syncope [ N ]  General Review of Systems: [Y] = yes [ N]=no  Consitutional:   nausea Klaus.Mock ];  fever [ N];  Eye : Amaurosis fugax[  N];  Resp: cough [ N];  hemoptysis[ N];  GI: vomiting[ N]; melena[N ]; hematochezia [N];  XB:JYNWGNFAO[Z ]; Musculoskeletal: myalgias[Y ];  Heme/Lymph: anemia[ N];  Neuro: TIA[N ];stroke[ N];  seizures[N ];  Endocrine: diabetes[N ];   Vital Signs: Vitals:   06/12/23 1244  BP: (!) 153/85  Pulse: 95  Resp: 18  SpO2: 96%     Physical Exam: CV-RRR, no murmur Neck-No carotid bruit Pulmonary-Clear to auscultation bilaterally Abdomen-Soft, non tender, bowel sounds present Extremities-No LE edema Neurologic-Grossly intact without focal deficit  Diagnostic Tests:  ADDENDUM REPORT: 03/24/2023 17:53   EXAM: OVER-READ INTERPRETATION  CT CHEST   The following report is an over-read performed by radiologist Dr. Narda Rutherford of [facility] on 03/24/2023. This over-read does not include interpretation of cardiac or coronary anatomy or pathology. The coronary calcium score interpretation by the cardiologist is attached.   COMPARISON:  None.   FINDINGS: Vascular: Mild aortic atherosclerosis. Ascending aorta is upper normal in caliber at 3.8 cm.   Mediastinum/nodes: No adenopathy or mass. Unremarkable esophagus.   Lungs: Minor subsegmental atelectasis or scarring in the lingula. No pulmonary nodule. No pleural fluid. The included airways are  patent.   Upper abdomen: No acute findings. Simple cyst in the liver needs no further imaging follow-up.   Musculoskeletal: There are no acute or suspicious osseous abnormalities. Scoliosis and mild degenerative change in the spine.   IMPRESSION: 1. Simple hepatic cyst needs no further imaging follow-up. 2. Minor subsegmental atelectasis or scarring in the  lingula. 3.  Aortic Atherosclerosis (ICD10-I70.0).     Electronically Signed   By: Narda Rutherford M.D.   On: 03/24/2023 17:53   Impression and Plan: Coronary calcium scan showed a 3.8 cm ascending aorta. We discussed that this imaging study is not as accurate as others for determining if a thoracic aortic aneurysm is present or not. She is hypertensive at this visit, but in review of past medical appointments, her BP has been well controlled. She sees her PCP yearly so will defer BP surveillance to her. We discussed the natural history and and risk factors for growth of ascending aortic aneurysms.  We covered the importance of smoking cessation, tight blood pressure control, refraining from lifting heavy objects, and avoiding fluoroquinolones.  The patient is aware of signs and symptoms of aortic dissection and when to present to the emergency department.  We will begin surveillance and a repeat CT of the chest  or CTA will be ordered for 1 year.     Ardelle Balls, PA-C [facility] [phone]

## 2023-06-12 ENCOUNTER — Institutional Professional Consult (permissible substitution): Payer: Medicare Other | Admitting: Physician Assistant

## 2023-06-12 VITALS — BP 153/85 | HR 95 | Resp 18 | Ht 65.0 in | Wt 183.0 lb

## 2023-06-12 DIAGNOSIS — H524 Presbyopia: Secondary | ICD-10-CM | POA: Diagnosis not present

## 2023-06-12 DIAGNOSIS — Z961 Presence of intraocular lens: Secondary | ICD-10-CM | POA: Diagnosis not present

## 2023-06-12 DIAGNOSIS — I712 Thoracic aortic aneurysm, without rupture, unspecified: Secondary | ICD-10-CM | POA: Diagnosis not present

## 2023-06-12 DIAGNOSIS — H04123 Dry eye syndrome of bilateral lacrimal glands: Secondary | ICD-10-CM | POA: Diagnosis not present

## 2023-06-12 DIAGNOSIS — H5213 Myopia, bilateral: Secondary | ICD-10-CM | POA: Diagnosis not present

## 2023-06-12 DIAGNOSIS — D3132 Benign neoplasm of left choroid: Secondary | ICD-10-CM | POA: Diagnosis not present

## 2023-06-12 NOTE — Patient Instructions (Addendum)
Risk Modification in those with ascending thoracic aortic aneurysm:  Continue good control of blood pressure (prefer SBP 130/80 or less)  2. Avoid fluoroquinolone antibiotics (I.e Ciprofloxacin, Avelox, Levofloxacin, Ofloxacin)  3.  Use of statin (to decrease cardiovascular risk)-Lipid profile done April 2024 showed total cholesterol 199, Triglycerides 70, LDL 105. Per medical doctor, recent coronary calcium score of 0 and with history of musculoskeletal pain, not on statin  4.  Exercise and activity limitations is individualized, but in general, contact sports are to be avoided and one should avoid heavy lifting (defined as half of ideal body weight) and exercises involving sustained Valsalva maneuver. This does not apply to her.  5. Counseling for those suspected of having genetically mediated disease. First-degree relatives of those with TAA disease should be screened as well as those who have a connective tissue disease (I.e with Marfan syndrome, Ehlers-Danlos syndrome, and Loeys-Dietz syndrome) or a  bicuspid aortic valve,have an increased risk for complications related to TAA. The above does not apply.  6. She has a remote history of tobacco abuse;quit 1979

## 2023-06-18 ENCOUNTER — Other Ambulatory Visit: Payer: Self-pay | Admitting: Internal Medicine

## 2023-06-18 NOTE — Telephone Encounter (Signed)
Modafinil refilled.

## 2023-07-06 DIAGNOSIS — Z6829 Body mass index (BMI) 29.0-29.9, adult: Secondary | ICD-10-CM | POA: Diagnosis not present

## 2023-07-06 DIAGNOSIS — M4712 Other spondylosis with myelopathy, cervical region: Secondary | ICD-10-CM | POA: Diagnosis not present

## 2023-07-06 DIAGNOSIS — M542 Cervicalgia: Secondary | ICD-10-CM | POA: Diagnosis not present

## 2023-07-09 DIAGNOSIS — Z23 Encounter for immunization: Secondary | ICD-10-CM | POA: Diagnosis not present

## 2023-07-25 DIAGNOSIS — L820 Inflamed seborrheic keratosis: Secondary | ICD-10-CM | POA: Diagnosis not present

## 2023-07-31 DIAGNOSIS — G894 Chronic pain syndrome: Secondary | ICD-10-CM | POA: Diagnosis not present

## 2023-07-31 DIAGNOSIS — M47812 Spondylosis without myelopathy or radiculopathy, cervical region: Secondary | ICD-10-CM | POA: Diagnosis not present

## 2023-07-31 DIAGNOSIS — Z79891 Long term (current) use of opiate analgesic: Secondary | ICD-10-CM | POA: Diagnosis not present

## 2023-07-31 DIAGNOSIS — G43109 Migraine with aura, not intractable, without status migrainosus: Secondary | ICD-10-CM | POA: Diagnosis not present

## 2023-07-31 DIAGNOSIS — M4712 Other spondylosis with myelopathy, cervical region: Secondary | ICD-10-CM | POA: Diagnosis not present

## 2023-10-02 DIAGNOSIS — R293 Abnormal posture: Secondary | ICD-10-CM | POA: Diagnosis not present

## 2023-10-02 DIAGNOSIS — M546 Pain in thoracic spine: Secondary | ICD-10-CM | POA: Diagnosis not present

## 2023-10-02 DIAGNOSIS — M532X2 Spinal instabilities, cervical region: Secondary | ICD-10-CM | POA: Diagnosis not present

## 2023-10-02 DIAGNOSIS — R29898 Other symptoms and signs involving the musculoskeletal system: Secondary | ICD-10-CM | POA: Diagnosis not present

## 2023-10-03 DIAGNOSIS — M25511 Pain in right shoulder: Secondary | ICD-10-CM | POA: Diagnosis not present

## 2023-10-03 DIAGNOSIS — M542 Cervicalgia: Secondary | ICD-10-CM | POA: Diagnosis not present

## 2023-10-03 DIAGNOSIS — G8929 Other chronic pain: Secondary | ICD-10-CM | POA: Diagnosis not present

## 2023-10-03 DIAGNOSIS — M25512 Pain in left shoulder: Secondary | ICD-10-CM | POA: Diagnosis not present

## 2023-10-03 DIAGNOSIS — M4802 Spinal stenosis, cervical region: Secondary | ICD-10-CM | POA: Diagnosis not present

## 2023-10-04 DIAGNOSIS — R293 Abnormal posture: Secondary | ICD-10-CM | POA: Diagnosis not present

## 2023-10-04 DIAGNOSIS — R29898 Other symptoms and signs involving the musculoskeletal system: Secondary | ICD-10-CM | POA: Diagnosis not present

## 2023-10-04 DIAGNOSIS — M532X2 Spinal instabilities, cervical region: Secondary | ICD-10-CM | POA: Diagnosis not present

## 2023-10-04 DIAGNOSIS — M546 Pain in thoracic spine: Secondary | ICD-10-CM | POA: Diagnosis not present

## 2023-10-08 DIAGNOSIS — R293 Abnormal posture: Secondary | ICD-10-CM | POA: Diagnosis not present

## 2023-10-08 DIAGNOSIS — M532X2 Spinal instabilities, cervical region: Secondary | ICD-10-CM | POA: Diagnosis not present

## 2023-10-08 DIAGNOSIS — M546 Pain in thoracic spine: Secondary | ICD-10-CM | POA: Diagnosis not present

## 2023-10-08 DIAGNOSIS — R29898 Other symptoms and signs involving the musculoskeletal system: Secondary | ICD-10-CM | POA: Diagnosis not present

## 2023-10-09 DIAGNOSIS — Z79891 Long term (current) use of opiate analgesic: Secondary | ICD-10-CM | POA: Diagnosis not present

## 2023-10-09 DIAGNOSIS — M47812 Spondylosis without myelopathy or radiculopathy, cervical region: Secondary | ICD-10-CM | POA: Diagnosis not present

## 2023-10-09 DIAGNOSIS — G43109 Migraine with aura, not intractable, without status migrainosus: Secondary | ICD-10-CM | POA: Diagnosis not present

## 2023-10-09 DIAGNOSIS — G894 Chronic pain syndrome: Secondary | ICD-10-CM | POA: Diagnosis not present

## 2023-10-12 DIAGNOSIS — M532X2 Spinal instabilities, cervical region: Secondary | ICD-10-CM | POA: Diagnosis not present

## 2023-10-12 DIAGNOSIS — M546 Pain in thoracic spine: Secondary | ICD-10-CM | POA: Diagnosis not present

## 2023-10-12 DIAGNOSIS — R293 Abnormal posture: Secondary | ICD-10-CM | POA: Diagnosis not present

## 2023-10-12 DIAGNOSIS — R29898 Other symptoms and signs involving the musculoskeletal system: Secondary | ICD-10-CM | POA: Diagnosis not present

## 2023-10-15 DIAGNOSIS — R29898 Other symptoms and signs involving the musculoskeletal system: Secondary | ICD-10-CM | POA: Diagnosis not present

## 2023-10-15 DIAGNOSIS — R293 Abnormal posture: Secondary | ICD-10-CM | POA: Diagnosis not present

## 2023-10-15 DIAGNOSIS — M546 Pain in thoracic spine: Secondary | ICD-10-CM | POA: Diagnosis not present

## 2023-10-15 DIAGNOSIS — M532X2 Spinal instabilities, cervical region: Secondary | ICD-10-CM | POA: Diagnosis not present

## 2023-10-18 DIAGNOSIS — R293 Abnormal posture: Secondary | ICD-10-CM | POA: Diagnosis not present

## 2023-10-18 DIAGNOSIS — M532X2 Spinal instabilities, cervical region: Secondary | ICD-10-CM | POA: Diagnosis not present

## 2023-10-18 DIAGNOSIS — R29898 Other symptoms and signs involving the musculoskeletal system: Secondary | ICD-10-CM | POA: Diagnosis not present

## 2023-10-18 DIAGNOSIS — M546 Pain in thoracic spine: Secondary | ICD-10-CM | POA: Diagnosis not present

## 2023-10-22 DIAGNOSIS — R29898 Other symptoms and signs involving the musculoskeletal system: Secondary | ICD-10-CM | POA: Diagnosis not present

## 2023-10-22 DIAGNOSIS — R293 Abnormal posture: Secondary | ICD-10-CM | POA: Diagnosis not present

## 2023-10-22 DIAGNOSIS — M532X2 Spinal instabilities, cervical region: Secondary | ICD-10-CM | POA: Diagnosis not present

## 2023-10-22 DIAGNOSIS — M546 Pain in thoracic spine: Secondary | ICD-10-CM | POA: Diagnosis not present

## 2023-10-26 DIAGNOSIS — R293 Abnormal posture: Secondary | ICD-10-CM | POA: Diagnosis not present

## 2023-10-26 DIAGNOSIS — M546 Pain in thoracic spine: Secondary | ICD-10-CM | POA: Diagnosis not present

## 2023-10-26 DIAGNOSIS — M532X2 Spinal instabilities, cervical region: Secondary | ICD-10-CM | POA: Diagnosis not present

## 2023-10-26 DIAGNOSIS — R29898 Other symptoms and signs involving the musculoskeletal system: Secondary | ICD-10-CM | POA: Diagnosis not present

## 2023-10-29 DIAGNOSIS — M532X2 Spinal instabilities, cervical region: Secondary | ICD-10-CM | POA: Diagnosis not present

## 2023-10-29 DIAGNOSIS — R293 Abnormal posture: Secondary | ICD-10-CM | POA: Diagnosis not present

## 2023-10-29 DIAGNOSIS — R29898 Other symptoms and signs involving the musculoskeletal system: Secondary | ICD-10-CM | POA: Diagnosis not present

## 2023-10-29 DIAGNOSIS — M546 Pain in thoracic spine: Secondary | ICD-10-CM | POA: Diagnosis not present

## 2023-11-01 DIAGNOSIS — R29898 Other symptoms and signs involving the musculoskeletal system: Secondary | ICD-10-CM | POA: Diagnosis not present

## 2023-11-01 DIAGNOSIS — M546 Pain in thoracic spine: Secondary | ICD-10-CM | POA: Diagnosis not present

## 2023-11-01 DIAGNOSIS — M4802 Spinal stenosis, cervical region: Secondary | ICD-10-CM | POA: Diagnosis not present

## 2023-11-01 DIAGNOSIS — R293 Abnormal posture: Secondary | ICD-10-CM | POA: Diagnosis not present

## 2023-11-01 DIAGNOSIS — M532X2 Spinal instabilities, cervical region: Secondary | ICD-10-CM | POA: Diagnosis not present

## 2023-11-02 ENCOUNTER — Other Ambulatory Visit: Payer: Self-pay | Admitting: Internal Medicine

## 2023-11-02 DIAGNOSIS — Z1231 Encounter for screening mammogram for malignant neoplasm of breast: Secondary | ICD-10-CM

## 2023-11-05 DIAGNOSIS — R29898 Other symptoms and signs involving the musculoskeletal system: Secondary | ICD-10-CM | POA: Diagnosis not present

## 2023-11-05 DIAGNOSIS — M532X2 Spinal instabilities, cervical region: Secondary | ICD-10-CM | POA: Diagnosis not present

## 2023-11-05 DIAGNOSIS — M546 Pain in thoracic spine: Secondary | ICD-10-CM | POA: Diagnosis not present

## 2023-11-05 DIAGNOSIS — R293 Abnormal posture: Secondary | ICD-10-CM | POA: Diagnosis not present

## 2023-11-07 NOTE — Progress Notes (Unsigned)
 Office Visit Note  Patient: Kari Flynn             Date of Birth: Dec 06, 1950           MRN: [id number]             PCP: Margaree Mackintosh, MD Referring: Margaree Mackintosh, MD Visit Date: 11/21/2023 Occupation: @GUAROCC @  Subjective:  Trapezius muscle soreness   History of Present Illness: Kari Flynn is a 73 y.o. female with history of osteoarthritis and fibromyalgia.  Patient reports that she has been going to [facility] since November 2024.  Patient states that she has had 5 sessions of dry needling which she feels has been helpful at alleviating some of her discomfort.  She continues to have occipital neuralgia was intermittently.  Patient tried a nerve block in July 2024 which was not effective.  She is having some trapezius muscle soreness on a daily basis.  Patient states that she had a cervical spine MRI and x-rays of both shoulders updated on 10/28/2023 but has not yet heard back with the results.  Patient remains on methadone and Tylenol for pain relief.  Patient states that her knee joints are doing well after completing viscosupplementation in March/April 2024.  She denies any joint swelling at this time.   Activities of Daily Living:  Patient reports morning stiffness for 2 hours.   Patient Reports nocturnal pain.  Difficulty dressing/grooming: Denies Difficulty climbing stairs: Reports Difficulty getting out of chair: Reports Difficulty using hands for taps, buttons, cutlery, and/or writing: Denies  Review of Systems  Constitutional:  Positive for fatigue.  HENT:  Negative for mouth sores, mouth dryness and nose dryness.   Eyes:  Negative for pain and dryness.  Respiratory:  Negative for shortness of breath and difficulty breathing.   Cardiovascular:  Negative for chest pain and palpitations.  Gastrointestinal:  Negative for blood in stool, constipation and diarrhea.  Endocrine: Negative for increased urination.  Genitourinary:  Negative for involuntary  urination.  Musculoskeletal:  Positive for joint pain, joint pain, joint swelling, myalgias, muscle weakness, morning stiffness, muscle tenderness and myalgias. Negative for gait problem.  Skin:  Positive for hair loss. Negative for color change, rash and sensitivity to sunlight.  Allergic/Immunologic: Negative for susceptible to infections.  Neurological:  Positive for headaches. Negative for dizziness.  Hematological:  Negative for swollen glands.  Psychiatric/Behavioral:  Negative for depressed mood and sleep disturbance. The patient is not nervous/anxious.     PMFS History:  Patient Active Problem List   Diagnosis Date Noted   Primary osteoarthritis of both hands 05/18/2021   Cervical spondylosis without myelopathy 01/12/2016   Primary osteoarthritis of first carpometacarpal joint of left hand 01/12/2016   Hypersomnia with sleep apnea 08/14/2014   Osteoarthritis of both knees 03/22/2014   Constipation 10/16/2013   Insomnia 06/23/2010   Obstructive sleep apnea 06/25/2008   Migraine headache 06/25/2008   Fibromyalgia 06/25/2008   Shortness of breath 06/25/2008    Past Medical History:  Diagnosis Date   Arthritis    Dyspnea    Lump of breast, left 11/08/2016   scheduled for a diagnostic/US mammo bilateral and stereo, US aspiration and ductogram.    Migraine, unspecified, without mention of intractable migraine without mention of status migrainosus    Myalgia and myositis, unspecified    Obstructive sleep apnea (adult) (pediatric)    Vertigo     Family History  Problem Relation Age of Onset   Hypertension Mother  Arthritis Mother    Hyperlipidemia Mother    Melanoma Mother    Diverticulitis Mother    Multiple myeloma Father    Arthritis Other        sibling   Sarcoidosis Other        sibling-also has NIDDM   Diabetes Brother    Sarcoidosis Brother    Heart attack Brother    Chronic Renal Failure Brother    Diabetes Paternal Grandfather    Colon cancer Neg Hx     Esophageal cancer Neg Hx    Rectal cancer Neg Hx    Stomach cancer Neg Hx    Past Surgical History:  Procedure Laterality Date   ANTERIOR CERVICAL DECOMP/DISCECTOMY FUSION  04/16/2023   BLEPHAROPLASTY     BREAST BIOPSY     B9   BUNIONECTOMY     x2   CATARACT EXTRACTION, BILATERAL     01/2020, 02/2020   REFRACTIVE SURGERY Right 04/2022   laser eye surgery   WISDOM TOOTH EXTRACTION     Social History   Social History Narrative   Not on file   Immunization History  Administered Date(s) Administered   Fluad Quad(high Dose 65+) 06/19/2019, 06/07/2021   Influenza Split 06/09/2011, 08/01/2012, 05/28/2017   Influenza Whole 06/24/2009, 06/07/2010   Influenza, High Dose Seasonal PF 07/13/2014, 07/06/2015, 05/31/2017, 07/05/2018, 07/29/2020   Influenza,inj,Quad PF,6+ Mos 08/01/2013   Influenza-Unspecified 07/25/2014, 07/06/2015, 06/06/2016, 06/16/2022   PFIZER(Purple Top)SARS-COV-2 Vaccination 10/06/2019, 10/30/2019, 05/12/2020   PNEUMOCOCCAL CONJUGATE-20 12/22/2022   Pneumococcal Conjugate-13 11/05/2014   Pneumococcal Polysaccharide-23 11/05/2017   Tdap 06/09/2011, 12/16/2020     Objective: Vital Signs: BP (!) 148/84 (BP Location: Left Arm, Patient Position: Sitting, Cuff Size: Large)   Pulse 74   Resp 14   Ht 5\' 5"  (1.651 m)   Wt 195 lb 12.8 oz (88.8 kg)   BMI 32.58 kg/m    Physical Exam Vitals and nursing note reviewed.  Constitutional:      Appearance: She is well-developed.  HENT:     Head: Normocephalic and atraumatic.  Eyes:     Conjunctiva/sclera: Conjunctivae normal.  Cardiovascular:     Rate and Rhythm: Normal rate and regular rhythm.     Heart sounds: Normal heart sounds.  Pulmonary:     Effort: Pulmonary effort is normal.     Breath sounds: Normal breath sounds.  Abdominal:     General: Bowel sounds are normal.     Palpations: Abdomen is soft.  Musculoskeletal:     Cervical back: Normal range of motion.  Lymphadenopathy:     Cervical: No cervical  adenopathy.  Skin:    General: Skin is warm and dry.     Capillary Refill: Capillary refill takes less than 2 seconds.  Neurological:     Mental Status: She is alert and oriented to person, place, and time.  Psychiatric:        Behavior: Behavior normal.      Musculoskeletal Exam: C-spine has limited range of motion with lateral rotation.  Trapezius muscle tension and tenderness bilaterally.  Slightly limited abduction of both shoulders.  Elbow joints and wrist joints have good range of motion.  PIP and DIP thickening.  Synovial thickening of bilateral second MCP joints.  CMC joint prominence bilaterally.  Knee joints have good range of motion with crepitus bilaterally.  No warmth or effusion of knee joints noted.  Ankle joints have good range of motion without tenderness or joint swelling.  CDAI Exam: CDAI Score: -- Patient Global: --;  Provider Global: -- Swollen: --; Tender: -- Joint Exam 11/21/2023   No joint exam has been documented for this visit   There is currently no information documented on the homunculus. Go to the Rheumatology activity and complete the homunculus joint exam.  Investigation: No additional findings.  Imaging: No results found.  Recent Labs: Lab Results  Component Value Date   WBC 3.8 12/19/2022   HGB 13.9 12/19/2022   PLT 203 12/19/2022   NA 142 12/19/2022   K 4.4 12/19/2022   CL 105 12/19/2022   CO2 29 12/19/2022   GLUCOSE 92 12/19/2022   BUN 25 12/19/2022   CREATININE 0.60 12/19/2022   BILITOT 0.5 12/19/2022   ALKPHOS 85 10/27/2016   AST 18 12/19/2022   ALT 23 12/19/2022   PROT 6.5 12/19/2022   ALBUMIN 4.1 10/27/2016   CALCIUM 9.6 12/19/2022   GFRAA 96 12/06/2020    Speciality Comments: No specialty comments available.  Procedures:  No procedures performed Allergies: Divalproex sodium, Meloxicam, and Valproic acid   Assessment / Plan:     Visit Diagnoses: Primary osteoarthritis of both knees - s/p euflexxa bilateral knees  10/2022-11/2022.  She had good response to visco-supplementation for both knees.  She is good range of motion of both knee joints with mild crepitus but no warmth or effusion noted.  She will notify us when and if she would like to reapply for viscosupplementation in the future.  Primary osteoarthritis of both hands: X-rays of both hands were consistent with osteoarthritis.  She also had bilateral second MCP joint narrowing suggestive of inflammatory arthritis.  Rheumatoid factor and anti-CCP were negative and ESR and CRP were within normal limits on 02/20/2023.  On examination she has PIP and DIP thickening.  She also has synovial thickening of bilateral second MCP joints, most prominent on the right side.  Discussed scheduling an ultrasound to assess for synovitis but she would like to hold off at this time.  She will notify us if she develops any new or worsening symptoms.  Trochanteric bursitis of both hips: Intermittent discomfort.   DDD (degenerative disc disease), cervical - S/P ACDF, C5-C6 and C6-C7 fusion by Dr. Lovell Sheehan April 16, 2023. Chronic pain and stiffness.   Patient had an updated MRI of the cervical spine performed on 10/28/2023 but has not yet heard back with the results.  Trapezius muscle spasm: Currently going to PT-dry needling.  She has trapezius muscle soreness bilaterally.  She takes Flexeril 5 mg 1 tablet every 8 hours as needed for muscle spasms.  Fibromyalgia: Patient continues to have generalized myalgias and muscle tenderness due to fibromyalgia.  She is currently experiencing trapezius muscle soreness bilaterally.  She has been going to still take PT since November 2024 and has had a total of 5 dry needling sessions.  She has started to notice some improvement in her symptoms with dry needling.  According to the patient she recently had an MRI of the cervical spine and x-rays of both shoulders updated on 10/28/2023 but has not yet heard back about the results.  History of  insomnia: She experiences nocturnal pain which interrupts her sleep at night. Discussed the importance of good sleep hygiene.   History of fatigue: She takes Provigil as prescribed. Discussed the importance of regular exercise.  Osteopenia of multiple sites - DEXA 09/01/21: The BMD measured at Forearm Radius 33% is 0.733 g/cm2 with a T-scoreof -1.6---ordered by Dr. Lenord Fellers.   Due to update DEXA She is taking a calcium and vitamin  D supplement daily.  Vitamin D deficiency: She is taking vitamin D 2000 units daily.  Other medical conditions are listed as follows:  History of sleep apnea  History of migraine  History of depression  Orders: No orders of the defined types were placed in this encounter.  No orders of the defined types were placed in this encounter.    Follow-Up Instructions: Return in about 6 months (around 05/23/2024) for Osteoarthritis, Fibromyalgia.   Gearldine Bienenstock, PA-C  Note - This record has been created using Dragon software.  Chart creation errors have been sought, but may not always  have been located. Such creation errors do not reflect on  the standard of medical care.

## 2023-11-08 DIAGNOSIS — R29898 Other symptoms and signs involving the musculoskeletal system: Secondary | ICD-10-CM | POA: Diagnosis not present

## 2023-11-08 DIAGNOSIS — R293 Abnormal posture: Secondary | ICD-10-CM | POA: Diagnosis not present

## 2023-11-08 DIAGNOSIS — M546 Pain in thoracic spine: Secondary | ICD-10-CM | POA: Diagnosis not present

## 2023-11-08 DIAGNOSIS — M532X2 Spinal instabilities, cervical region: Secondary | ICD-10-CM | POA: Diagnosis not present

## 2023-11-12 DIAGNOSIS — M47812 Spondylosis without myelopathy or radiculopathy, cervical region: Secondary | ICD-10-CM | POA: Diagnosis not present

## 2023-11-12 DIAGNOSIS — G894 Chronic pain syndrome: Secondary | ICD-10-CM | POA: Diagnosis not present

## 2023-11-12 DIAGNOSIS — M546 Pain in thoracic spine: Secondary | ICD-10-CM | POA: Diagnosis not present

## 2023-11-12 DIAGNOSIS — Z79891 Long term (current) use of opiate analgesic: Secondary | ICD-10-CM | POA: Diagnosis not present

## 2023-11-12 DIAGNOSIS — R293 Abnormal posture: Secondary | ICD-10-CM | POA: Diagnosis not present

## 2023-11-12 DIAGNOSIS — R29898 Other symptoms and signs involving the musculoskeletal system: Secondary | ICD-10-CM | POA: Diagnosis not present

## 2023-11-12 DIAGNOSIS — G43109 Migraine with aura, not intractable, without status migrainosus: Secondary | ICD-10-CM | POA: Diagnosis not present

## 2023-11-12 DIAGNOSIS — M532X2 Spinal instabilities, cervical region: Secondary | ICD-10-CM | POA: Diagnosis not present

## 2023-11-15 DIAGNOSIS — R29898 Other symptoms and signs involving the musculoskeletal system: Secondary | ICD-10-CM | POA: Diagnosis not present

## 2023-11-15 DIAGNOSIS — R293 Abnormal posture: Secondary | ICD-10-CM | POA: Diagnosis not present

## 2023-11-15 DIAGNOSIS — M532X2 Spinal instabilities, cervical region: Secondary | ICD-10-CM | POA: Diagnosis not present

## 2023-11-15 DIAGNOSIS — M546 Pain in thoracic spine: Secondary | ICD-10-CM | POA: Diagnosis not present

## 2023-11-16 ENCOUNTER — Encounter: Payer: Self-pay | Admitting: Gastroenterology

## 2023-11-19 DIAGNOSIS — R29898 Other symptoms and signs involving the musculoskeletal system: Secondary | ICD-10-CM | POA: Diagnosis not present

## 2023-11-19 DIAGNOSIS — M546 Pain in thoracic spine: Secondary | ICD-10-CM | POA: Diagnosis not present

## 2023-11-19 DIAGNOSIS — M532X2 Spinal instabilities, cervical region: Secondary | ICD-10-CM | POA: Diagnosis not present

## 2023-11-19 DIAGNOSIS — R293 Abnormal posture: Secondary | ICD-10-CM | POA: Diagnosis not present

## 2023-11-21 ENCOUNTER — Ambulatory Visit: Payer: Medicare Other | Attending: Physician Assistant | Admitting: Physician Assistant

## 2023-11-21 ENCOUNTER — Encounter: Payer: Self-pay | Admitting: Physician Assistant

## 2023-11-21 VITALS — BP 148/84 | HR 74 | Resp 14 | Ht 65.0 in | Wt 195.8 lb

## 2023-11-21 DIAGNOSIS — M19041 Primary osteoarthritis, right hand: Secondary | ICD-10-CM | POA: Insufficient documentation

## 2023-11-21 DIAGNOSIS — Z87898 Personal history of other specified conditions: Secondary | ICD-10-CM | POA: Insufficient documentation

## 2023-11-21 DIAGNOSIS — M5030 Other cervical disc degeneration, unspecified cervical region: Secondary | ICD-10-CM | POA: Insufficient documentation

## 2023-11-21 DIAGNOSIS — M19042 Primary osteoarthritis, left hand: Secondary | ICD-10-CM | POA: Diagnosis not present

## 2023-11-21 DIAGNOSIS — Z8659 Personal history of other mental and behavioral disorders: Secondary | ICD-10-CM | POA: Diagnosis not present

## 2023-11-21 DIAGNOSIS — M7061 Trochanteric bursitis, right hip: Secondary | ICD-10-CM | POA: Diagnosis not present

## 2023-11-21 DIAGNOSIS — M797 Fibromyalgia: Secondary | ICD-10-CM | POA: Insufficient documentation

## 2023-11-21 DIAGNOSIS — Z8669 Personal history of other diseases of the nervous system and sense organs: Secondary | ICD-10-CM | POA: Insufficient documentation

## 2023-11-21 DIAGNOSIS — M8589 Other specified disorders of bone density and structure, multiple sites: Secondary | ICD-10-CM | POA: Diagnosis not present

## 2023-11-21 DIAGNOSIS — M170 Bilateral primary osteoarthritis of knee: Secondary | ICD-10-CM | POA: Diagnosis not present

## 2023-11-21 DIAGNOSIS — M62838 Other muscle spasm: Secondary | ICD-10-CM | POA: Insufficient documentation

## 2023-11-21 DIAGNOSIS — E559 Vitamin D deficiency, unspecified: Secondary | ICD-10-CM | POA: Diagnosis not present

## 2023-11-21 DIAGNOSIS — M7062 Trochanteric bursitis, left hip: Secondary | ICD-10-CM | POA: Insufficient documentation

## 2023-11-22 ENCOUNTER — Ambulatory Visit
Admission: RE | Admit: 2023-11-22 | Discharge: 2023-11-22 | Disposition: A | Discharge status: Home / Self Care | Source: Ambulatory Visit | Attending: Internal Medicine | Admitting: Internal Medicine

## 2023-11-22 DIAGNOSIS — R29898 Other symptoms and signs involving the musculoskeletal system: Secondary | ICD-10-CM | POA: Diagnosis not present

## 2023-11-22 DIAGNOSIS — M546 Pain in thoracic spine: Secondary | ICD-10-CM | POA: Diagnosis not present

## 2023-11-22 DIAGNOSIS — R293 Abnormal posture: Secondary | ICD-10-CM | POA: Diagnosis not present

## 2023-11-22 DIAGNOSIS — Z1231 Encounter for screening mammogram for malignant neoplasm of breast: Secondary | ICD-10-CM

## 2023-11-22 DIAGNOSIS — M532X2 Spinal instabilities, cervical region: Secondary | ICD-10-CM | POA: Diagnosis not present

## 2023-11-26 DIAGNOSIS — R293 Abnormal posture: Secondary | ICD-10-CM | POA: Diagnosis not present

## 2023-11-26 DIAGNOSIS — M532X2 Spinal instabilities, cervical region: Secondary | ICD-10-CM | POA: Diagnosis not present

## 2023-11-26 DIAGNOSIS — M546 Pain in thoracic spine: Secondary | ICD-10-CM | POA: Diagnosis not present

## 2023-11-26 DIAGNOSIS — R29898 Other symptoms and signs involving the musculoskeletal system: Secondary | ICD-10-CM | POA: Diagnosis not present

## 2023-11-29 DIAGNOSIS — M532X2 Spinal instabilities, cervical region: Secondary | ICD-10-CM | POA: Diagnosis not present

## 2023-11-29 DIAGNOSIS — R293 Abnormal posture: Secondary | ICD-10-CM | POA: Diagnosis not present

## 2023-11-29 DIAGNOSIS — M546 Pain in thoracic spine: Secondary | ICD-10-CM | POA: Diagnosis not present

## 2023-11-29 DIAGNOSIS — R29898 Other symptoms and signs involving the musculoskeletal system: Secondary | ICD-10-CM | POA: Diagnosis not present

## 2023-12-03 DIAGNOSIS — R29898 Other symptoms and signs involving the musculoskeletal system: Secondary | ICD-10-CM | POA: Diagnosis not present

## 2023-12-03 DIAGNOSIS — R293 Abnormal posture: Secondary | ICD-10-CM | POA: Diagnosis not present

## 2023-12-03 DIAGNOSIS — M532X2 Spinal instabilities, cervical region: Secondary | ICD-10-CM | POA: Diagnosis not present

## 2023-12-03 DIAGNOSIS — M546 Pain in thoracic spine: Secondary | ICD-10-CM | POA: Diagnosis not present

## 2023-12-06 DIAGNOSIS — M546 Pain in thoracic spine: Secondary | ICD-10-CM | POA: Diagnosis not present

## 2023-12-06 DIAGNOSIS — M532X2 Spinal instabilities, cervical region: Secondary | ICD-10-CM | POA: Diagnosis not present

## 2023-12-06 DIAGNOSIS — R293 Abnormal posture: Secondary | ICD-10-CM | POA: Diagnosis not present

## 2023-12-06 DIAGNOSIS — R29898 Other symptoms and signs involving the musculoskeletal system: Secondary | ICD-10-CM | POA: Diagnosis not present

## 2023-12-09 NOTE — Progress Notes (Signed)
 Patient ID: Kari Flynn, female    DOB: [birth year], 73 y.o.   MRN: [id number]  HPI F former smoker, followed for OSA, Insomnia, complicated by hx of fibromyalgia and migraine.  NPSG 01/10/11-AHI 27.7/hour, desaturation to 86% ------------------------------------------------------------   12/11/22- 73 year old female former smoker followed for OSA, insomnia, complicated by history fibromyalgia, migraine, Osteoarthritis,  CPAP 5-20/[facility]        Replaced CPAP Feb, 2021 AirSense 10 AutoSet -modafinil  200,  Download-compliance 93%, AI 5.4/ hr Body weight today- 178 lbs Download reviewed.  Doing well with CPAP. She finds splitting modafinil  tablet in half, taking twice daily is working well for her for daytime alertness.  Continues attention to good sleep habits at night.  12/11/23- 73 year old female former smoker followed for OSA, insomnia, complicated by history fibromyalgia, migraine, Osteoarthritis,  CPAP 5-20/[facility]        Replaced CPAP Feb, 2021 AirSense 10 AutoSet -modafinil  200,  Download-compliance 83%, AHI 3.9/hr Body weight today- 193 lbs C-spine surgery last August. Still hurts. Getting new TENS device. Depends on modafinil  to control daytime sleepiness despite CPAP. Discussed the use of AI scribe software for clinical note transcription with the patient, who gave verbal consent to proceed.  History of Present Illness   The patient, who underwent neck surgery in August of the previous year, reports slow progress in recovery. She has been attending physical therapy twice a week since December, focusing on increasing neck rotation. Despite consistent therapy and at-home exercises, she describes the progress as 'very, very slow.' She has tried dry needling with electricity, which provides temporary relief lasting about 24 to 36 hours. A new machine, an inferential stimulator, has been ordered to help with deep muscle stimulation. The patient reports persistent pain and stiffness,  describing it as an 'ache.' She can achieve up to 80 degrees of rotation after exercises but still experiences significant discomfort.  Sleep is affected by the neck stiffness and pain, despite trying various pillows. The patient reports waking up stiff and achy regardless of the pillow used. She has no issues with her CPAP machine and can get comfortable once lying down. She "depends on" modafinil , using 1/2 tab twice daily.  The patient also mentions a follow-up MRI and shoulder x-rays conducted six weeks ago, which showed some arthritic changes but nothing dramatic. She expresses a strong desire to avoid further neck surgery.     Assessment and Plan:   Benefits from CPAP with good compliance and control Obstructive Sleep Apnea (OSA) OSA managed with CPAP at 11 cm H2O. Apneas <4/hour. Full face mask used effectively. - Continue current CPAP therapy settings.  Use of Modafinil  Modafinil  effectively manages daytime sleepiness. No refill needed. - Continue current modafinil  regimen.  Chronic neck pain and stiffness- managed elsewhere. We discussed impact on sleep. Persistent post-surgical neck pain and stiffness. Physical therapy shows slow progress. Dry needling provides temporary relief. MRI and x-rays show arthritic changes. No interest in further surgery. - Continue physical therapy twice a week. - Use the inferential stimulator once received. - Consider alternative pain management strategies if no improvement.     ROS-see HPI + = positive Constitutional:   No-   weight loss, night sweats, fevers, chills, +fatigue, lassitude. HEENT:   + headaches, difficulty swallowing, tooth/dental problems, sore throat,       No-  sneezing, itching, ear ache, +nasal congestion, post nasal drip,  CV:  No-   chest pain, orthopnea, PND, swelling in lower extremities, anasarca,  dizziness, palpitations Resp: No- acute  shortness of breath with exertion or at rest.              No-   productive cough,   No non-productive cough,  No- coughing up of blood.              No-   change in color of mucus.  No- wheezing.   Skin: No-   rash or lesions. GI:  No-   heartburn, indigestion, abdominal pain, nausea, vomiting,  GU:  MS:  No-   joint pain or swelling.   Neuro-     nothing unusual Psych:  No- change in mood or affect. No depression or anxiety.  No memory loss.   Objective:   Physical Exam General- Alert, Oriented, Affect-normal, Distress- none acute; medium build Skin- rash-none, lesions- none, excoriation- none Lymphadenopathy- none Head- atraumatic            Eyes- Gross vision intact, PERRLA, conjunctivae clear secretions;             Ears- Hearing, canals-normal            Nose- Clear, no-Septal dev, mucus, polyps, erosion, perforation             Throat- Mallampati III-IV , mucosa clear , drainage- none, tonsils- atrophic Neck- flexible , trachea midline, no stridor , thyroid  nl, carotid no bruit Chest - symmetrical excursion , unlabored           Heart/CV- RRR , no murmur , no gallop  , no rub, nl s1 s2                           - JVD- none , edema- none, stasis changes- none, varices- none           Lung- clear to P&A, wheeze- none, cough- none , dullness-none, rub- none           Chest wall-  Abd-  Br/ Gen/ Rectal- Not done, not indicated Extrem- cyanosis- none, clubbing, none, atrophy- none, strength- nl Neuro- grossly intact to observation

## 2023-12-10 DIAGNOSIS — M546 Pain in thoracic spine: Secondary | ICD-10-CM | POA: Diagnosis not present

## 2023-12-10 DIAGNOSIS — M532X2 Spinal instabilities, cervical region: Secondary | ICD-10-CM | POA: Diagnosis not present

## 2023-12-10 DIAGNOSIS — R29898 Other symptoms and signs involving the musculoskeletal system: Secondary | ICD-10-CM | POA: Diagnosis not present

## 2023-12-10 DIAGNOSIS — R293 Abnormal posture: Secondary | ICD-10-CM | POA: Diagnosis not present

## 2023-12-11 ENCOUNTER — Ambulatory Visit (INDEPENDENT_AMBULATORY_CARE_PROVIDER_SITE_OTHER): Payer: Medicare Other | Admitting: Internal Medicine

## 2023-12-11 ENCOUNTER — Encounter: Payer: Self-pay | Admitting: Internal Medicine

## 2023-12-11 VITALS — BP 134/74 | HR 73 | Temp 98.0°F | Ht 65.0 in | Wt 193.4 lb

## 2023-12-11 DIAGNOSIS — Z87891 Personal history of nicotine dependence: Secondary | ICD-10-CM | POA: Diagnosis not present

## 2023-12-11 DIAGNOSIS — G4733 Obstructive sleep apnea (adult) (pediatric): Secondary | ICD-10-CM

## 2023-12-11 NOTE — Patient Instructions (Signed)
 We can continue CPAP auto 5-20. You are doing great!  We can refill modafinil when needed.

## 2023-12-13 DIAGNOSIS — M532X2 Spinal instabilities, cervical region: Secondary | ICD-10-CM | POA: Diagnosis not present

## 2023-12-13 DIAGNOSIS — R293 Abnormal posture: Secondary | ICD-10-CM | POA: Diagnosis not present

## 2023-12-13 DIAGNOSIS — R29898 Other symptoms and signs involving the musculoskeletal system: Secondary | ICD-10-CM | POA: Diagnosis not present

## 2023-12-13 DIAGNOSIS — M546 Pain in thoracic spine: Secondary | ICD-10-CM | POA: Diagnosis not present

## 2023-12-20 ENCOUNTER — Other Ambulatory Visit: Payer: Medicare Other

## 2023-12-20 DIAGNOSIS — M546 Pain in thoracic spine: Secondary | ICD-10-CM | POA: Diagnosis not present

## 2023-12-20 DIAGNOSIS — Z136 Encounter for screening for cardiovascular disorders: Secondary | ICD-10-CM | POA: Diagnosis not present

## 2023-12-20 DIAGNOSIS — R42 Dizziness and giddiness: Secondary | ICD-10-CM

## 2023-12-20 DIAGNOSIS — Z0000 Encounter for general adult medical examination without abnormal findings: Secondary | ICD-10-CM

## 2023-12-20 DIAGNOSIS — R29898 Other symptoms and signs involving the musculoskeletal system: Secondary | ICD-10-CM | POA: Diagnosis not present

## 2023-12-20 DIAGNOSIS — R293 Abnormal posture: Secondary | ICD-10-CM | POA: Diagnosis not present

## 2023-12-20 DIAGNOSIS — M532X2 Spinal instabilities, cervical region: Secondary | ICD-10-CM | POA: Diagnosis not present

## 2023-12-21 LAB — CBC WITH DIFFERENTIAL/PLATELET
Absolute Lymphocytes: 1492 {cells}/uL (ref 850–3900)
Absolute Monocytes: 370 {cells}/uL (ref 200–950)
Basophils Absolute: 30 {cells}/uL (ref 0–200)
Basophils Relative: 0.7 %
Eosinophils Absolute: 69 {cells}/uL (ref 15–500)
Eosinophils Relative: 1.6 %
HCT: 42.9 % (ref 35.0–45.0)
Hemoglobin: 14 g/dL (ref 11.7–15.5)
MCH: 30.5 pg (ref 27.0–33.0)
MCHC: 32.6 g/dL (ref 32.0–36.0)
MCV: 93.5 fL (ref 80.0–100.0)
MPV: 10.1 fL (ref 7.5–12.5)
Monocytes Relative: 8.6 %
Neutro Abs: 2339 {cells}/uL (ref 1500–7800)
Neutrophils Relative %: 54.4 %
Platelets: 219 10*3/uL (ref 140–400)
RBC: 4.59 10*6/uL (ref 3.80–5.10)
RDW: 12.9 % (ref 11.0–15.0)
Total Lymphocyte: 34.7 %
WBC: 4.3 10*3/uL (ref 3.8–10.8)

## 2023-12-21 LAB — COMPLETE METABOLIC PANEL WITHOUT GFR
AG Ratio: 2 (calc) (ref 1.0–2.5)
ALT: 24 U/L (ref 6–29)
AST: 20 U/L (ref 10–35)
Albumin: 4.3 g/dL (ref 3.6–5.1)
Alkaline phosphatase (APISO): 118 U/L (ref 37–153)
BUN: 20 mg/dL (ref 7–25)
CO2: 27 mmol/L (ref 20–32)
Calcium: 9.5 mg/dL (ref 8.6–10.4)
Chloride: 104 mmol/L (ref 98–110)
Creat: 0.62 mg/dL (ref 0.60–1.00)
Globulin: 2.2 g/dL (ref 1.9–3.7)
Glucose, Bld: 89 mg/dL (ref 65–99)
Potassium: 4.6 mmol/L (ref 3.5–5.3)
Sodium: 141 mmol/L (ref 135–146)
Total Bilirubin: 0.5 mg/dL (ref 0.2–1.2)
Total Protein: 6.5 g/dL (ref 6.1–8.1)

## 2023-12-21 LAB — LIPID PANEL
Cholesterol: 205 mg/dL — ABNORMAL HIGH (ref <200)
HDL: 82 mg/dL (ref >=50)
LDL Cholesterol (Calc): 105 mg/dL — ABNORMAL HIGH
Non-HDL Cholesterol (Calc): 123 mg/dL (ref <130)
Total CHOL/HDL Ratio: 2.5 (calc) (ref <5.0)
Triglycerides: 85 mg/dL (ref <150)

## 2023-12-21 LAB — TSH: TSH: 2.26 m[IU]/L (ref 0.40–4.50)

## 2023-12-21 NOTE — Progress Notes (Shared)
 Annual Wellness Visit   Patient Care Team: Sylvan Evener, MD as PCP - General (Internal Medicine)  Visit Date: 12/24/23   Chief Complaint  Patient presents with   Annual Exam   Subjective:  Patient: Kari Flynn, Female DOB: 09-15-1950, 73 y.o. MRN: [id number] Kari Flynn is a 73 y.o. Female who presents today for her Annual Wellness Visit. Patient has Obstructive Sleep Apnea; Migraine Headache; Fibromyalgia; Insomnia; Shortness Of Breath; Constipation; Osteoarthritis, Both Knees; Hypersomnia w/ Sleep Apnea; Cervical Spondylosis w/o Myelopathy; Primary Osteoarthritis, First Carpometacarpal Joint, Left Hand; and Primary Osteoarthritis, Both Hands.  History of Fibromyalgia Syndrome; Multilevel Spondylosis treated with Methadone  5 mg every 8 hours, Meloxicam 15 mg daily as needed, and Flexeril 5 mg every 8 hours as needed; Migraine Headaches treated with Fioricet 50-300-40 mg as needed. Followed by Dr. Gwendel Lemme with [facility].   History of Arthritis, Both Knees treated with occasional Euflexxa injections bilateral, last done March/April 2024. Followed by Dr. Nicholas Bari, Rheumatologist.   S/p Anterior Cervical Decompression/Discectomy Fusion C5-6 & C6-7 by Dr. Larrie Po on August 18th 2024 for Chronic Neck Pain. She says that she is still having some pain and stiffness and doesn't feel like she's made much progress since starting PT twice weekly in December 2024; did have dry needling done which apparently helped some, and was also prescribed NexWave TENS unit by Dr. Valda Garnet, which she says she has been using three times daily and has been helping some. Denies any numbness. Says that she hasn't felt like doing much around due to her pain.   History of Obstructive Sleep Apnea; Hypersomnia treated with CPAP device and Provigil  100 mg twice daily. Followed by Dr. Dianne Fortune, Pulmonology  History of Vertigo treated with Meclizine  25 mg as needed three times  daily  History of Abdominal Pain treated with Levsin SL as needed, usually only uses it 3x every 4 months according to her.  Labs 12/20/2023 CBC: WNL CMP: WNL  Lipid Panel, compared to 11/2022: Cholesterol 205, elevated from 199; LDL 105, no change; otherwise WNL.  TSH: 2.26  Mammogram 11/22/2023  normal with repeat recommendation of 2026.  Overdue for Colonoscopy since 09/2023; last completed 10/22/2013 was normal.  Bone Density 09/01/2021  T-score Forearm Radius -1.6, osteopenic. She does take Calcium Carbonate-Vitamin D  and Vitamin-D 2000 units daily  Vaccine Counseling: Due for Shingles 2/2 - she says that she has had her 2nd vaccination, will obtain records; UTD on Flu, PNA, and Tdap. Past Medical History:  Diagnosis Date   Arthritis    Dyspnea    Lump of breast, left 11/08/2016   scheduled for a diagnostic/US  mammo bilateral and stereo, US  aspiration and ductogram.    Migraine, unspecified, without mention of intractable migraine without mention of status migrainosus    Myalgia and myositis, unspecified    Obstructive sleep apnea (adult) (pediatric)    Vertigo    Medical/Surgical History Narrative:   Allergic/Intolerant to: Dicalproex Sodium - severe diarrhea & severe alopecia; Meloxicam - alopecia; Valproic Acid - alopecia.   2023 - Refractive Surgery/Laser Eye Surgery in September  2021 - Benign Familial Tremor diagnosed by Dr. Omar Bibber, Neurologist in May. Bilateral Cataract Extraction by Dr. Roslynn Coombes in the June and July  2015 - Infected Laceration, Right Leg during Summer  1997 - Bilateral Tubal Ligation  1995 - Benign Right Breast Biopsy in February   1990 - Bunionectomy  1979 - D & C  Other - Surgical Hx of: Blepharoplasty, Bunionectomy Family History  Problem Relation Age of Onset   Hypertension Mother    Arthritis Mother    Hyperlipidemia Mother    Melanoma Mother    Diverticulitis Mother    Multiple myeloma Father    Arthritis Other        sibling    Sarcoidosis Other        sibling-also has NIDDM   Diabetes Brother    Sarcoidosis Brother    Heart attack Brother    Chronic Renal Failure Brother    Diabetes Paternal Grandfather    Colon cancer Neg Hx    Esophageal cancer Neg Hx    Rectal cancer Neg Hx    Stomach cancer Neg Hx    Family History Narrative: No Family History of GI Cancer.  Fhx of Heart Disease, Hypertension, Multiple Myeloma, Myocardial Infarction, Arthritis, and Sarcoidosis in unknown family member(s) Paternal Grandfather w/ hx of Diabetes Father, deceased due to Multiple Myeloma, thought to possibly have Sleep Apnea Mother, deceased Jan 14, 2015due to Congestive Heart Failure Complications w/ hx of Hypertension, Hyperlipidemia, Gout, Diverticulitis, and Melanoma Brother w/ hx of Chronic Renal Failure/Chronic Kidney Disease, Myocardial Infarction, and Sarcoidosis Social History   Social History Narrative   Married. 2 adult sons. Non-smoker. Formerly worked as a Orthoptist at [employer].    Review of Systems  Constitutional:  Negative for chills, fever, malaise/fatigue and weight loss.  HENT:  Negative for hearing loss, sinus pain and sore throat.   Respiratory:  Negative for cough, hemoptysis and shortness of breath.   Cardiovascular:  Negative for chest pain, palpitations, leg swelling and PND.  Gastrointestinal:  Negative for abdominal pain, constipation, diarrhea, heartburn, nausea and vomiting.  Genitourinary:  Negative for dysuria, frequency and urgency.  Musculoskeletal:  Positive for neck pain (s/p cervical decomp/fusion, still in PT). Negative for back pain and myalgias.  Skin:  Negative for itching and rash.  Neurological:  Negative for dizziness, tingling, seizures and headaches.  Endo/Heme/Allergies:  Negative for polydipsia.  Psychiatric/Behavioral:  Negative for depression. The patient is not nervous/anxious.     Objective:  Vitals: BP 128/76   Pulse 74   Temp 99 F (37.2 C)  (Temporal)   Ht 5\' 5"  (1.651 m)   Wt 193 lb 12.8 oz (87.9 kg)   SpO2 97%   BMI 32.25 kg/m  Physical Exam Vitals and nursing note reviewed.  Constitutional:      General: She is not in acute distress.    Appearance: Normal appearance. She is not ill-appearing or toxic-appearing.  HENT:     Head: Normocephalic and atraumatic.     Right Ear: Hearing, tympanic membrane, ear canal and external ear normal.     Left Ear: Hearing, tympanic membrane, ear canal and external ear normal.     Mouth/Throat:     Pharynx: Oropharynx is clear.  Eyes:     Extraocular Movements: Extraocular movements intact.     Pupils: Pupils are equal, round, and reactive to light.  Neck:     Thyroid : No thyroid  mass, thyromegaly or thyroid  tenderness.     Vascular: No carotid bruit.  Cardiovascular:     Rate and Rhythm: Normal rate and regular rhythm. No extrasystoles are present.    Pulses:          Dorsalis pedis pulses are 2+ on the right side and 2+ on the left side.     Heart sounds: Normal heart sounds. No murmur heard.    No friction rub. No gallop.  Pulmonary:  Effort: Pulmonary effort is normal.     Breath sounds: Normal breath sounds. No decreased breath sounds, wheezing, rhonchi or rales.  Chest:     Chest wall: No mass.  Abdominal:     Palpations: Abdomen is soft. There is no hepatomegaly, splenomegaly or mass.     Tenderness: There is no abdominal tenderness.     Hernia: No hernia is present.  Musculoskeletal:     Cervical back: Normal range of motion.     Right lower leg: No edema.     Left lower leg: No edema.  Lymphadenopathy:     Cervical: No cervical adenopathy.     Upper Body:     Right upper body: No supraclavicular adenopathy.     Left upper body: No supraclavicular adenopathy.  Skin:    General: Skin is warm and dry.  Neurological:     General: No focal deficit present.     Mental Status: She is alert and oriented to person, place, and time. Mental status is at baseline.      Sensory: Sensation is intact.     Motor: Motor function is intact. No weakness.     Deep Tendon Reflexes: Reflexes are normal and symmetric.  Psychiatric:        Attention and Perception: Attention normal.        Mood and Affect: Mood normal.        Speech: Speech normal.        Behavior: Behavior normal.        Thought Content: Thought content normal.        Cognition and Memory: Cognition normal.        Judgment: Judgment normal.   Most Recent Functional Status Assessment:    12/24/2023   10:11 AM  In your present state of health, do you have any difficulty performing the following activities:  Hearing? 0  Vision? 0  Difficulty concentrating or making decisions? 0  Walking or climbing stairs? 0  Dressing or bathing? 0  Doing errands, shopping? 0  Preparing Food and eating ? N  Using the Toilet? N  In the past six months, have you accidently leaked urine? N  Do you have problems with loss of bowel control? N  Managing your Medications? N  Managing your Finances? N  Housekeeping or managing your Housekeeping? N   Most Recent Fall Risk Assessment:    12/24/2023   10:13 AM  Fall Risk   Falls in the past year? 0  Number falls in past yr: 0  Injury with Fall? 0  Risk for fall due to : No Fall Risks  Follow up Falls evaluation completed   Most Recent Depression Screenings:    12/24/2023   10:22 AM 12/22/2022   10:03 AM  PHQ 2/9 Scores  PHQ - 2 Score 2 0  PHQ- 9 Score 5    Most Recent Cognitive Screening:    12/24/2023   10:13 AM  6CIT Screen  What Year? 4 points  What month? 3 points  What time? 3 points  Count back from 20 4 points  Months in reverse 4 points  Repeat phrase 10 points  Total Score 28 points   Results:  Studies Obtained And Personally Reviewed By Me:  Mammogram 11/22/2023  normal.  Colonoscopy 10/22/2013 was normal.  Bone Density 09/01/2021  T-score Forearm Radius -1.6, osteopenic.  Labs:     Component Value Date/Time   NA 141  12/20/2023 0926   K 4.6 12/20/2023 0926  CL 104 12/20/2023 0926   CO2 27 12/20/2023 0926   GLUCOSE 89 12/20/2023 0926   BUN 20 12/20/2023 0926   CREATININE 0.62 12/20/2023 0926   CALCIUM 9.5 12/20/2023 0926   PROT 6.5 12/20/2023 0926   ALBUMIN 4.1 10/27/2016 1158   AST 20 12/20/2023 0926   ALT 24 12/20/2023 0926   ALKPHOS 85 10/27/2016 1158   BILITOT 0.5 12/20/2023 0926   GFRNONAA 83 12/06/2020 0940   GFRAA 96 12/06/2020 0940    Lab Results  Component Value Date   WBC 4.3 12/20/2023   HGB 14.0 12/20/2023   HCT 42.9 12/20/2023   MCV 93.5 12/20/2023   PLT 219 12/20/2023   Lab Results  Component Value Date   CHOL 205 (H) 12/20/2023   HDL 82 12/20/2023   LDLCALC 105 (H) 12/20/2023   TRIG 85 12/20/2023   CHOLHDL 2.5 12/20/2023   Lab Results  Component Value Date   TSH 2.26 12/20/2023    Assessment & Plan:   Orders Placed This Encounter  Procedures   POCT URINALYSIS DIP (CLINITEK)   Meds ordered this encounter  Medications   hyoscyamine  (LEVSIN/SL) 0.125 MG SL tablet    Sig: Place 1 tablet (0.125 mg total) under the tongue every 6 (six) hours as needed.    Dispense:  90 tablet    Refill:  1   buPROPion  (WELLBUTRIN  XL) 150 MG 24 hr tablet    Sig: Take 1 tablet (150 mg total) by mouth daily.    Dispense:  90 tablet    Refill:  0  Other Labs Reviewed today: CBC: WNL CMP: WNL  Lipid Panel, compared to 11/2022: Cholesterol 205, elevated from 199; LDL 105, no change; otherwise WNL.  TSH: 2.26  Fibromyalgia Syndrome; Multilevel Spondylosis treated with Methadone  5 mg every 8 hours, Meloxicam 15 mg daily as needed, and Flexeril 5 mg every 8 hours as needed; Migraine Headaches treated with Fioricet 50-300-40 mg as needed. Followed by Dr. Gwendel Lemme with [facility].   Arthritis, Both Knees treated with occasional Euflexxa injections bilateral, last done March/April 2024. Followed by Dr. Nicholas Bari, Rheumatologist.   S/p Anterior Cervical  Decompression/Discectomy Fusion C5-6 & C6-7 by Dr. Larrie Po on August 18th 2024 for Chronic Neck Pain. She says that she is still having some pain and stiffness and doesn't feel like she's made much progress since starting PT twice weekly in December 2024; did have dry needling done which apparently helped some, and was also prescribed NexWave TENS unit by Dr. Valda Garnet, which she says she has been using three times daily and has been helping some. Denies any numbness.   Depression: says that she hasn't felt like doing much around due to her pain. Will send in Wellbutrin -XL 150 mg to take daily and recommend she try to get out of the house and walk daily. Follow-up in 6 weeks to discuss.   Obstructive Sleep Apnea; Hypersomnia treated with CPAP device and Provigil  100 mg twice daily. Followed by Dr. Dianne Fortune, Pulmonology  Vertigo treated with Meclizine  25 mg as needed three times daily  History of Abdominal Pain treated with Levsin SL as needed, usually only uses it 3x every 4 months according to her.  Mammogram 11/22/2023  normal with repeat recommendation of 2026.  Overdue for Colonoscopy since 09/2023; last completed 10/22/2013 was normal.  Bone Density 09/01/2021  T-score Forearm Radius -1.6, osteopenic. She does take Calcium Carbonate-Vitamin D  and Vitamin-D 2000 units daily  Vaccine Counseling: Due for Shingles 2/2 -  she says that she has had her 2nd vaccination, will obtain records; UTD on Flu, PNA, and Tdap.  Return in about 6 weeks (around 02/04/2024) for Depression/Discuss Wellbutrin .   Annual wellness visit done today including the all of the following: Reviewed patient's Family Medical History Reviewed and updated list of patient's medical providers Assessment of cognitive impairment was done Assessed patient's functional ability Established a written schedule for health screening services Health Risk Assessent Completed and Reviewed  Discussed health benefits of physical activity,  and encouraged her to engage in regular exercise appropriate for her age and condition.    I,Emily Lagle,acting as a Neurosurgeon for Sylvan Evener, MD.,have documented all relevant documentation on the behalf of Sylvan Evener, MD,as directed by  Sylvan Evener, MD while in the presence of Sylvan Evener, MD.   I, Sylvan Evener, MD, have reviewed all documentation for this visit. The documentation on 12/25/23 for the exam, diagnosis, procedures, and orders are all accurate and complete.

## 2023-12-24 ENCOUNTER — Ambulatory Visit (INDEPENDENT_AMBULATORY_CARE_PROVIDER_SITE_OTHER): Payer: Medicare Other | Admitting: Internal Medicine

## 2023-12-24 ENCOUNTER — Encounter: Payer: Self-pay | Admitting: Internal Medicine

## 2023-12-24 VITALS — BP 128/76 | HR 74 | Temp 99.0°F | Ht 65.0 in | Wt 193.8 lb

## 2023-12-24 DIAGNOSIS — M532X2 Spinal instabilities, cervical region: Secondary | ICD-10-CM | POA: Diagnosis not present

## 2023-12-24 DIAGNOSIS — M4322 Fusion of spine, cervical region: Secondary | ICD-10-CM

## 2023-12-24 DIAGNOSIS — F0631 Mood disorder due to known physiological condition with depressive features: Secondary | ICD-10-CM | POA: Diagnosis not present

## 2023-12-24 DIAGNOSIS — G4733 Obstructive sleep apnea (adult) (pediatric): Secondary | ICD-10-CM

## 2023-12-24 DIAGNOSIS — M19041 Primary osteoarthritis, right hand: Secondary | ICD-10-CM

## 2023-12-24 DIAGNOSIS — M170 Bilateral primary osteoarthritis of knee: Secondary | ICD-10-CM

## 2023-12-24 DIAGNOSIS — M8580 Other specified disorders of bone density and structure, unspecified site: Secondary | ICD-10-CM

## 2023-12-24 DIAGNOSIS — Z8669 Personal history of other diseases of the nervous system and sense organs: Secondary | ICD-10-CM

## 2023-12-24 DIAGNOSIS — R293 Abnormal posture: Secondary | ICD-10-CM | POA: Diagnosis not present

## 2023-12-24 DIAGNOSIS — Z87898 Personal history of other specified conditions: Secondary | ICD-10-CM | POA: Diagnosis not present

## 2023-12-24 DIAGNOSIS — M546 Pain in thoracic spine: Secondary | ICD-10-CM | POA: Diagnosis not present

## 2023-12-24 DIAGNOSIS — M19042 Primary osteoarthritis, left hand: Secondary | ICD-10-CM | POA: Diagnosis not present

## 2023-12-24 DIAGNOSIS — R29898 Other symptoms and signs involving the musculoskeletal system: Secondary | ICD-10-CM | POA: Diagnosis not present

## 2023-12-24 DIAGNOSIS — M797 Fibromyalgia: Secondary | ICD-10-CM

## 2023-12-24 DIAGNOSIS — Z0000 Encounter for general adult medical examination without abnormal findings: Secondary | ICD-10-CM | POA: Diagnosis not present

## 2023-12-24 LAB — POCT URINALYSIS DIP (CLINITEK)
Bilirubin, UA: NEGATIVE
Blood, UA: NEGATIVE
Glucose, UA: NEGATIVE mg/dL
Ketones, POC UA: NEGATIVE mg/dL
Leukocytes, UA: NEGATIVE
Nitrite, UA: NEGATIVE
Spec Grav, UA: 1.01 (ref 1.010–1.025)
Urobilinogen, UA: 0.2 U/dL
pH, UA: 7 (ref 5.0–8.0)

## 2023-12-24 MED ORDER — BUPROPION HCL ER (XL) 150 MG PO TB24
150.0000 mg | ORAL_TABLET | Freq: Every day | ORAL | 0 refills | Status: DC
Start: 2023-12-24 — End: 2024-02-04

## 2023-12-24 MED ORDER — HYOSCYAMINE SULFATE 0.125 MG SL SUBL: 0.1250 mg | SUBLINGUAL_TABLET | Freq: Four times a day (QID) | SUBLINGUAL | 1 refills | Status: AC | PRN

## 2023-12-25 ENCOUNTER — Other Ambulatory Visit: Payer: Self-pay | Admitting: Internal Medicine

## 2023-12-25 ENCOUNTER — Encounter: Payer: Self-pay | Admitting: Internal Medicine

## 2023-12-25 NOTE — Telephone Encounter (Signed)
Modafinil refilled.

## 2023-12-25 NOTE — Telephone Encounter (Signed)
**Note De-identified  [place] Obfuscation** Please advise 

## 2023-12-25 NOTE — Patient Instructions (Addendum)
 Trying Wellbutrin XL 150 mg daily for depression symptoms.    Dose can be increased to 300 mg XL daily if needed.  Labs are stable.  Continue using CPAP device for sleep apnea.  Continue Levsin for irritable bowel syndrome as needed.  Vaccines discussed.  Continue follow-up with rheumatology for arthritis of knees and fibromyalgia syndrome.  Continue chronic pain management with Dr. Valda Garnet.

## 2023-12-26 ENCOUNTER — Telehealth: Payer: Self-pay

## 2023-12-26 ENCOUNTER — Encounter: Payer: Self-pay | Admitting: Internal Medicine

## 2023-12-26 NOTE — Telephone Encounter (Signed)
 Request Reference Number: [id number]. MODAFINIL  TAB 200MG  is approved through 06/26/2024. Your patient may now fill this prescription and it will be covered. Effective Date: 12/26/2023 Authorization Expiration Date: 06/26/2024

## 2023-12-26 NOTE — Telephone Encounter (Signed)
*  Pulm  Pharmacy Patient Advocate Encounter   Received notification from CoverMyMeds that prior authorization for Modafinil  200MG  tablets  is required/requested.   Insurance verification completed.   The patient is insured through [facility] .   Per test claim: PA required; PA submitted to above mentioned insurance via CoverMyMeds Key/confirmation #/EOC [facility] Status is pending

## 2023-12-27 DIAGNOSIS — R293 Abnormal posture: Secondary | ICD-10-CM | POA: Diagnosis not present

## 2023-12-27 DIAGNOSIS — M546 Pain in thoracic spine: Secondary | ICD-10-CM | POA: Diagnosis not present

## 2023-12-27 DIAGNOSIS — M532X2 Spinal instabilities, cervical region: Secondary | ICD-10-CM | POA: Diagnosis not present

## 2023-12-27 DIAGNOSIS — R29898 Other symptoms and signs involving the musculoskeletal system: Secondary | ICD-10-CM | POA: Diagnosis not present

## 2023-12-31 DIAGNOSIS — M532X2 Spinal instabilities, cervical region: Secondary | ICD-10-CM | POA: Diagnosis not present

## 2023-12-31 DIAGNOSIS — R293 Abnormal posture: Secondary | ICD-10-CM | POA: Diagnosis not present

## 2023-12-31 DIAGNOSIS — R29898 Other symptoms and signs involving the musculoskeletal system: Secondary | ICD-10-CM | POA: Diagnosis not present

## 2023-12-31 DIAGNOSIS — M546 Pain in thoracic spine: Secondary | ICD-10-CM | POA: Diagnosis not present

## 2024-01-07 DIAGNOSIS — G43109 Migraine with aura, not intractable, without status migrainosus: Secondary | ICD-10-CM | POA: Diagnosis not present

## 2024-01-07 DIAGNOSIS — Z79891 Long term (current) use of opiate analgesic: Secondary | ICD-10-CM | POA: Diagnosis not present

## 2024-01-07 DIAGNOSIS — M532X2 Spinal instabilities, cervical region: Secondary | ICD-10-CM | POA: Diagnosis not present

## 2024-01-07 DIAGNOSIS — M47812 Spondylosis without myelopathy or radiculopathy, cervical region: Secondary | ICD-10-CM | POA: Diagnosis not present

## 2024-01-07 DIAGNOSIS — G894 Chronic pain syndrome: Secondary | ICD-10-CM | POA: Diagnosis not present

## 2024-01-07 DIAGNOSIS — M546 Pain in thoracic spine: Secondary | ICD-10-CM | POA: Diagnosis not present

## 2024-01-07 DIAGNOSIS — R293 Abnormal posture: Secondary | ICD-10-CM | POA: Diagnosis not present

## 2024-01-07 DIAGNOSIS — R29898 Other symptoms and signs involving the musculoskeletal system: Secondary | ICD-10-CM | POA: Diagnosis not present

## 2024-01-10 DIAGNOSIS — R29898 Other symptoms and signs involving the musculoskeletal system: Secondary | ICD-10-CM | POA: Diagnosis not present

## 2024-01-10 DIAGNOSIS — M532X2 Spinal instabilities, cervical region: Secondary | ICD-10-CM | POA: Diagnosis not present

## 2024-01-10 DIAGNOSIS — R293 Abnormal posture: Secondary | ICD-10-CM | POA: Diagnosis not present

## 2024-01-10 DIAGNOSIS — M546 Pain in thoracic spine: Secondary | ICD-10-CM | POA: Diagnosis not present

## 2024-01-14 DIAGNOSIS — M546 Pain in thoracic spine: Secondary | ICD-10-CM | POA: Diagnosis not present

## 2024-01-14 DIAGNOSIS — M532X2 Spinal instabilities, cervical region: Secondary | ICD-10-CM | POA: Diagnosis not present

## 2024-01-14 DIAGNOSIS — R293 Abnormal posture: Secondary | ICD-10-CM | POA: Diagnosis not present

## 2024-01-14 DIAGNOSIS — R29898 Other symptoms and signs involving the musculoskeletal system: Secondary | ICD-10-CM | POA: Diagnosis not present

## 2024-01-16 NOTE — Progress Notes (Shared)
 Patient Care Team: Sylvan Evener, MD as PCP - General (Internal Medicine)  Visit Date: 02/04/24  Subjective:   Chief Complaint  Patient presents with   Follow-up  Patient ZO:XWRUEA G Flynn,Female DOB:Oct 21, 1950,73 y.o. VWU:[id number]   73 y.o.Female presents today for 6 week follow-up for Depression. Patient has a past medical history of Arthritis; Myalgias; Vertigo. On 12/24/2023, she was started on Wellbutrin -XL 150 mg to take daily after mentioning that she hadn't been feeling like doing much due to her musculoskeletal pain. Follows-up today to discuss how she's tolerated the Wellbutrin  and how it's working for her. She says that since being started on Wellbutrin  she has been doing well with this and denies having any adverse side effects since starting.   Regarding her Musculoskeletal Pain, she says that she has still been having neck and shoulder pain, and occasional numbness. Dr. Rudolfo Cosier has reportedly started her on Celebrex 100 mg twice daily and a TENS unit, which she uses about 6 hours daily. Today says that she is using Lidocaine  patches along her shoulders for relief currently while in-office.  She saw Dr. Larrie Po on 5/27, and he reportedly ordered a CRP and ESR to r/o polymyalgia - tried to review these labs, but results are not in EPIC. However, according to Dr. Larrie Po' note, they discussed C1-2 fusion to address her Left C1-2 Arthropathy that could possibly be contributing to her occipital neuralgia, but he did not think this would provide significant relief for her and she would lose ROM and they decided unanimously to continue medication management.  She says that she feels that she has gained about 15 pounds since August 2024 when she underwent an Anterior Cervical Decompression/Discectomy Fusion C5-6 & C6-7. On review, she was 180 lbs BMI 29.58 in June 2024, prior to her procedure, and today weighs 197 lbs BMI 32.78 for a total weight gain on 17 pounds. Expresses uncertainty  about exact caloric intake, but estimates eating ~1600 cals daily. Says that she has tried using a stationary bike for exercise, but can't tolerate using it due to knee pain.   Past Medical History:  Diagnosis Date   Arthritis    Dyspnea    Lump of breast, left 11/08/2016   scheduled for a diagnostic/US  mammo bilateral and stereo, US  aspiration and ductogram.    Migraine, unspecified, without mention of intractable migraine without mention of status migrainosus    Myalgia and myositis, unspecified    Obstructive sleep apnea (adult) (pediatric)    Vertigo     Allergies  Allergen Reactions   Divalproex Sodium Other (See Comments)    Severe diarrhea & extreme hair loss   Meloxicam     Hair loss    Valproic Acid     Hair loss    Past Surgical History:  Procedure Laterality Date   ANTERIOR CERVICAL DECOMP/DISCECTOMY FUSION  04/16/2023   BLEPHAROPLASTY     BREAST BIOPSY     B9   BUNIONECTOMY     x2   CATARACT EXTRACTION, BILATERAL     01/2020, 02/2020   REFRACTIVE SURGERY Right 04/2022   laser eye surgery   WISDOM TOOTH EXTRACTION     Family History  Problem Relation Age of Onset   Hypertension Mother    Arthritis Mother    Hyperlipidemia Mother    Melanoma Mother    Diverticulitis Mother    Multiple myeloma Father    Arthritis Other        sibling   Sarcoidosis Other  sibling-also has NIDDM   Diabetes Brother    Sarcoidosis Brother    Heart attack Brother    Chronic Renal Failure Brother    Diabetes Paternal Grandfather    Colon cancer Neg Hx    Esophageal cancer Neg Hx    Rectal cancer Neg Hx    Stomach cancer Neg Hx    Social History   Social History Narrative   Married. 2 adult sons. Non-smoker. Formerly worked as a Orthoptist at [employer].    Review of Systems  Constitutional:        (+) Weight Gain  Musculoskeletal:  Positive for joint pain and neck pain.  All other systems reviewed and are negative.    Objective:  Vitals: BP  100/70   Pulse 76   Ht 5\' 5"  (1.651 m)   Wt 197 lb (89.4 kg)   SpO2 97%   BMI 32.78 kg/m   Physical Exam Vitals and nursing note reviewed.  Constitutional:      General: She is not in acute distress.    Appearance: Normal appearance. She is not toxic-appearing.  HENT:     Head: Normocephalic and atraumatic.  Pulmonary:     Effort: Pulmonary effort is normal.  Skin:    General: Skin is warm and dry.  Neurological:     Mental Status: She is alert and oriented to person, place, and time. Mental status is at baseline.  Psychiatric:        Mood and Affect: Mood normal.        Behavior: Behavior normal.        Thought Content: Thought content normal.        Judgment: Judgment normal.     Results:  Studies Obtained And Personally Reviewed By Me:  Cervical MRI 11/01/2023 Stable decompression and fusion at C5-6 and C6-7.  Arthropathy at C1-2 on the left  Lateral Flexion-Extension C-Spine X-ray 01/22/2024 Good position of the instrumentation at C5-6//C6-7 w/o evidence of pseudoarthrosis No abnormal soft tissue swelling nor instability   Labs:     Component Value Date/Time   NA 141 12/20/2023 0926   K 4.6 12/20/2023 0926   CL 104 12/20/2023 0926   CO2 27 12/20/2023 0926   GLUCOSE 89 12/20/2023 0926   BUN 20 12/20/2023 0926   CREATININE 0.62 12/20/2023 0926   CALCIUM 9.5 12/20/2023 0926   PROT 6.5 12/20/2023 0926   ALBUMIN 4.1 10/27/2016 1158   AST 20 12/20/2023 0926   ALT 24 12/20/2023 0926   ALKPHOS 85 10/27/2016 1158   BILITOT 0.5 12/20/2023 0926   GFRNONAA 83 12/06/2020 0940   GFRAA 96 12/06/2020 0940    Lab Results  Component Value Date   WBC 4.3 12/20/2023   HGB 14.0 12/20/2023   HCT 42.9 12/20/2023   MCV 93.5 12/20/2023   PLT 219 12/20/2023   Lab Results  Component Value Date   CHOL 205 (H) 12/20/2023   HDL 82 12/20/2023   LDLCALC 105 (H) 12/20/2023   TRIG 85 12/20/2023   CHOLHDL 2.5 12/20/2023    Lab Results  Component Value Date   TSH 2.26  12/20/2023    Assessment & Plan:   Orders Placed This Encounter  Procedures   Amb Referral to Nutrition and Diabetic Education   Meds ordered this encounter  Medications   buPROPion  (WELLBUTRIN  XL) 300 MG 24 hr tablet    Sig: Take 1 tablet (300 mg total) by mouth daily.    Dispense:  90 tablet  Refill:  1   Depression: on 12/24/2023, she was started on Wellbutrin -XL 150 mg to take daily after mentioning that she hadn't been feeling like doing much due to her musculoskeletal pain. Today she says that since being started on Wellbutrin  she has been doing well with this and denies having any adverse side effects since starting. Increasing Wellbutrin -XL to 300 mg, continue to take daily - sending in.  Musculoskeletal Pain; S/p Anterior Cervical Decompression/Discectomy Fusion C5-6 & C6-7, she says that she has still been having neck and shoulder pain, and occasional numbness. Dr. Rudolfo Cosier has reportedly started her on Celebrex 100 mg twice daily and a TENS unit, which she uses about 6 hours daily. Today says that she is using Lidocaine  patches along her shoulders for relief currently while in-office.  She saw Dr. Larrie Po on 5/27, and he reportedly ordered a CRP and ESR to r/o polymyalgia - tried to review these labs, but results are not in EPIC. However, according to Dr. Larrie Po' note, they discussed C1-2 fusion to address her Left C1-2 Arthropathy that could possibly be contributing to her occipital neuralgia, but he did not think this would provide significant relief for her and she would lose ROM and they decided unanimously to continue medication management.  Obesity; Hyperlipidemia: was 180 lbs BMI 29.58 in June 2024, prior to her procedure, and today weighs 197 lbs BMI 32.78 for a total weight gain on 17 pounds. Expresses uncertainty about exact caloric intake, but estimates eating ~1600 cals daily. Says that she has tried using a stationary bike for exercise, but can't tolerate using it due to  knee pain. 12/20/2023 Lipid Panel with Cholesterol 205 and LDL 105. Referring to Nutrition Education.     I,Emily Lagle,acting as a Neurosurgeon for Sylvan Evener, MD.,have documented all relevant documentation on the behalf of Sylvan Evener, MD,as directed by  Sylvan Evener, MD while in the presence of Sylvan Evener, MD.   ***

## 2024-01-17 DIAGNOSIS — M532X2 Spinal instabilities, cervical region: Secondary | ICD-10-CM | POA: Diagnosis not present

## 2024-01-17 DIAGNOSIS — R29898 Other symptoms and signs involving the musculoskeletal system: Secondary | ICD-10-CM | POA: Diagnosis not present

## 2024-01-17 DIAGNOSIS — R293 Abnormal posture: Secondary | ICD-10-CM | POA: Diagnosis not present

## 2024-01-17 DIAGNOSIS — M546 Pain in thoracic spine: Secondary | ICD-10-CM | POA: Diagnosis not present

## 2024-01-22 DIAGNOSIS — R293 Abnormal posture: Secondary | ICD-10-CM | POA: Diagnosis not present

## 2024-01-22 DIAGNOSIS — M546 Pain in thoracic spine: Secondary | ICD-10-CM | POA: Diagnosis not present

## 2024-01-22 DIAGNOSIS — M25511 Pain in right shoulder: Secondary | ICD-10-CM | POA: Diagnosis not present

## 2024-01-22 DIAGNOSIS — R29898 Other symptoms and signs involving the musculoskeletal system: Secondary | ICD-10-CM | POA: Diagnosis not present

## 2024-01-22 DIAGNOSIS — M542 Cervicalgia: Secondary | ICD-10-CM | POA: Diagnosis not present

## 2024-01-22 DIAGNOSIS — M532X2 Spinal instabilities, cervical region: Secondary | ICD-10-CM | POA: Diagnosis not present

## 2024-01-28 DIAGNOSIS — M546 Pain in thoracic spine: Secondary | ICD-10-CM | POA: Diagnosis not present

## 2024-01-28 DIAGNOSIS — R29898 Other symptoms and signs involving the musculoskeletal system: Secondary | ICD-10-CM | POA: Diagnosis not present

## 2024-01-28 DIAGNOSIS — M532X2 Spinal instabilities, cervical region: Secondary | ICD-10-CM | POA: Diagnosis not present

## 2024-01-28 DIAGNOSIS — R293 Abnormal posture: Secondary | ICD-10-CM | POA: Diagnosis not present

## 2024-02-04 ENCOUNTER — Ambulatory Visit (INDEPENDENT_AMBULATORY_CARE_PROVIDER_SITE_OTHER): Admitting: Internal Medicine

## 2024-02-04 ENCOUNTER — Encounter: Payer: Self-pay | Admitting: Internal Medicine

## 2024-02-04 VITALS — BP 100/70 | HR 76 | Ht 65.0 in | Wt 197.0 lb

## 2024-02-04 DIAGNOSIS — F0631 Mood disorder due to known physiological condition with depressive features: Secondary | ICD-10-CM

## 2024-02-04 DIAGNOSIS — Z6832 Body mass index (BMI) 32.0-32.9, adult: Secondary | ICD-10-CM

## 2024-02-04 DIAGNOSIS — Z8669 Personal history of other diseases of the nervous system and sense organs: Secondary | ICD-10-CM | POA: Diagnosis not present

## 2024-02-04 DIAGNOSIS — E66811 Obesity, class 1: Secondary | ICD-10-CM | POA: Diagnosis not present

## 2024-02-04 DIAGNOSIS — M170 Bilateral primary osteoarthritis of knee: Secondary | ICD-10-CM | POA: Diagnosis not present

## 2024-02-04 MED ORDER — BUPROPION HCL ER (XL) 300 MG PO TB24: 300.0000 mg | ORAL_TABLET | Freq: Every day | ORAL | 1 refills | Status: DC

## 2024-02-05 ENCOUNTER — Encounter: Payer: Self-pay | Admitting: Internal Medicine

## 2024-02-05 NOTE — Patient Instructions (Addendum)
 Increase dose of Wellbutrin  to 300 mg daily. May take 6 weeks to see improvement. Cannot increase dose any further. Hopefully this dose will help with energy level. Return as needed. Has CPE scheduled for Spring 2026.

## 2024-02-11 DIAGNOSIS — R29898 Other symptoms and signs involving the musculoskeletal system: Secondary | ICD-10-CM | POA: Diagnosis not present

## 2024-02-11 DIAGNOSIS — M546 Pain in thoracic spine: Secondary | ICD-10-CM | POA: Diagnosis not present

## 2024-02-11 DIAGNOSIS — R293 Abnormal posture: Secondary | ICD-10-CM | POA: Diagnosis not present

## 2024-02-11 DIAGNOSIS — M532X2 Spinal instabilities, cervical region: Secondary | ICD-10-CM | POA: Diagnosis not present

## 2024-03-03 DIAGNOSIS — G894 Chronic pain syndrome: Secondary | ICD-10-CM | POA: Diagnosis not present

## 2024-03-03 DIAGNOSIS — G43109 Migraine with aura, not intractable, without status migrainosus: Secondary | ICD-10-CM | POA: Diagnosis not present

## 2024-03-03 DIAGNOSIS — M47812 Spondylosis without myelopathy or radiculopathy, cervical region: Secondary | ICD-10-CM | POA: Diagnosis not present

## 2024-03-03 DIAGNOSIS — Z79891 Long term (current) use of opiate analgesic: Secondary | ICD-10-CM | POA: Diagnosis not present

## 2024-03-11 DIAGNOSIS — R29898 Other symptoms and signs involving the musculoskeletal system: Secondary | ICD-10-CM | POA: Diagnosis not present

## 2024-03-11 DIAGNOSIS — M546 Pain in thoracic spine: Secondary | ICD-10-CM | POA: Diagnosis not present

## 2024-03-11 DIAGNOSIS — M532X2 Spinal instabilities, cervical region: Secondary | ICD-10-CM | POA: Diagnosis not present

## 2024-03-11 DIAGNOSIS — R293 Abnormal posture: Secondary | ICD-10-CM | POA: Diagnosis not present

## 2024-03-25 ENCOUNTER — Telehealth: Payer: Self-pay | Admitting: Rheumatology

## 2024-03-25 NOTE — Telephone Encounter (Signed)
 Please schedule an office visit appointment for evaluation and injections as needed.

## 2024-03-25 NOTE — Telephone Encounter (Signed)
 Patient called stating she had neck fusion surgery last year, but is still experiencing a lot of pain.  Patient states she has been attending physical therapy who recommended she call our office.  Patient states Dr. Dolphus gave her cortisone injections in her neck before she had surgery and requested a return call.

## 2024-03-26 NOTE — Telephone Encounter (Signed)
 Called patient to let her know she is on our cancellation list and will be called as soon as an appointment with Dr. Dolphus becomes available.

## 2024-03-26 NOTE — Progress Notes (Signed)
 Office Visit Note  Patient: Kari Flynn             Date of Birth: [birth year]           MRN: [id number]             PCP: Perri Ronal PARAS, MD Referring: Perri Ronal PARAS, MD Visit Date: 04/02/2024 Occupation: @GUAROCC @  Subjective:  Neck pain  History of Present Illness: Kari Flynn is a 73 y.o. female with osteoarthritis and fibromyalgia syndrome.  She returns today after her last visit in March 2025.  She states she had cervical spine fusion about 1 year ago.  She went to physical therapy for about 7 months.  She states after the surgery her left-sided radiculopathy improved but the neck pain persist.  She has been using TENS unit and doing all the exercises on a regular basis but has been having ongoing discomfort.  She had relief from trigger point injections in the past.  She wants to have trigger point injections today in the trapezius region.  None of the other joints are painful.  She states the joints are doing better after the viscosupplement injections given last year.  She has off-and-on discomfort over the right SI joint.  She continues to take calcium and vitamin D .    Activities of Daily Living:  Patient reports morning stiffness for 2-3 hours.   Patient Reports nocturnal pain.  Difficulty dressing/grooming: Denies Difficulty climbing stairs: Denies Difficulty getting out of chair: Reports Difficulty using hands for taps, buttons, cutlery, and/or writing: Denies  Review of Systems  Constitutional:  Positive for fatigue.  HENT:  Negative for mouth sores and mouth dryness.   Eyes:  Positive for dryness.  Respiratory:  Positive for shortness of breath.   Cardiovascular:  Negative for chest pain and palpitations.  Gastrointestinal:  Negative for blood in stool, constipation and diarrhea.  Endocrine: Negative for increased urination.  Genitourinary:  Negative for involuntary urination.  Musculoskeletal:  Positive for joint pain, joint pain, myalgias, muscle weakness,  morning stiffness, muscle tenderness and myalgias. Negative for gait problem and joint swelling.  Skin:  Positive for hair loss. Negative for color change, rash and sensitivity to sunlight.  Allergic/Immunologic: Negative for susceptible to infections.  Neurological:  Positive for headaches. Negative for dizziness.  Hematological:  Negative for swollen glands.  Psychiatric/Behavioral:  Negative for depressed mood and sleep disturbance. The patient is not nervous/anxious.     PMFS History:  Patient Active Problem List   Diagnosis Date Noted   Primary osteoarthritis of both hands 05/18/2021   Cervical spondylosis without myelopathy 01/12/2016   Primary osteoarthritis of first carpometacarpal joint of left hand 01/12/2016   Hypersomnia with sleep apnea 08/14/2014   Osteoarthritis of both knees 03/22/2014   Constipation 10/16/2013   Insomnia 06/23/2010   Obstructive sleep apnea 06/25/2008   Migraine headache 06/25/2008   Fibromyalgia 06/25/2008   Shortness of breath 06/25/2008    Past Medical History:  Diagnosis Date   Arthritis    Dyspnea    Lump of breast, left 11/08/2016   scheduled for a diagnostic/US  mammo bilateral and stereo, US  aspiration and ductogram.    Migraine, unspecified, without mention of intractable migraine without mention of status migrainosus    Myalgia and myositis, unspecified    Obstructive sleep apnea (adult) (pediatric)    Vertigo     Family History  Problem Relation Age of Onset   Hypertension Mother    Arthritis Mother    Hyperlipidemia  Mother    Melanoma Mother    Diverticulitis Mother    Multiple myeloma Father    Arthritis Other        sibling   Sarcoidosis Other        sibling-also has NIDDM   Diabetes Brother    Sarcoidosis Brother    Heart attack Brother    Chronic Renal Failure Brother    Diabetes Paternal Grandfather    Colon cancer Neg Hx    Esophageal cancer Neg Hx    Rectal cancer Neg Hx    Stomach cancer Neg Hx    Past  Surgical History:  Procedure Laterality Date   ANTERIOR CERVICAL DECOMP/DISCECTOMY FUSION  04/16/2023   BLEPHAROPLASTY     BREAST BIOPSY     B9   BUNIONECTOMY     x2   CATARACT EXTRACTION, BILATERAL     01/2020, 02/2020   REFRACTIVE SURGERY Right 04/2022   laser eye surgery   WISDOM TOOTH EXTRACTION     Social History   Social History Narrative   Married. 2 adult sons. Non-smoker. Formerly worked as a Orthoptist at [employer].    Immunization History  Administered Date(s) Administered   Fluad Quad(high Dose 65+) 06/19/2019, 06/07/2021   Influenza Split 06/09/2011, 08/01/2012, 05/28/2017   Influenza Whole 06/24/2009, 06/07/2010   Influenza, High Dose Seasonal PF 07/13/2014, 07/06/2015, 05/31/2017, 07/05/2018, 07/29/2020   Influenza,inj,Quad PF,6+ Mos 08/01/2013   Influenza-Unspecified 07/25/2014, 07/06/2015, 06/06/2016, 06/16/2022   PFIZER(Purple Top)SARS-COV-2 Vaccination 10/06/2019, 10/30/2019, 05/12/2020   PNEUMOCOCCAL CONJUGATE-20 12/22/2022   Pneumococcal Conjugate-13 11/05/2014   Pneumococcal Polysaccharide-23 11/05/2017   Tdap 06/09/2011, 12/16/2020   Zoster Recombinant(Shingrix) 09/16/2020     Objective: Vital Signs: BP 127/86 (BP Location: Left Arm, Patient Position: Sitting, Cuff Size: Large)   Pulse 78   Resp 16   Ht 5' 5 (1.651 m)   Wt 195 lb 12.8 oz (88.8 kg)   BMI 32.58 kg/m    Physical Exam Vitals and nursing note reviewed.  Constitutional:      Appearance: She is well-developed.  HENT:     Head: Normocephalic and atraumatic.  Eyes:     Conjunctiva/sclera: Conjunctivae normal.  Cardiovascular:     Rate and Rhythm: Normal rate and regular rhythm.     Heart sounds: Normal heart sounds.  Pulmonary:     Effort: Pulmonary effort is normal.     Breath sounds: Normal breath sounds.  Abdominal:     General: Bowel sounds are normal.     Palpations: Abdomen is soft.  Musculoskeletal:     Cervical back: Normal range of motion.   Lymphadenopathy:     Cervical: No cervical adenopathy.  Skin:    General: Skin is warm and dry.     Capillary Refill: Capillary refill takes less than 2 seconds.  Neurological:     Mental Status: She is alert and oriented to person, place, and time.  Psychiatric:        Behavior: Behavior normal.      Musculoskeletal Exam: Limited lateral rotation of the cervical spine with stiffness.  She had bilateral trapezius spasm.  Mild thoracic kyphosis was noted.  Shoulder joint abduction was limited to 90 degrees due to neck discomfort.  Elbow joints and wrist joints were in good range of motion.  Bilateral CMC PIP and DIP thickening with no synovitis was noted.  Hip joints and knee joints in good range of motion.  There was no tenderness or swelling over knee joints.  There was  no tenderness over her ankles or MTPs.  CDAI Exam: CDAI Score: -- Patient Global: --; Provider Global: -- Swollen: --; Tender: -- Joint Exam 04/02/2024   No joint exam has been documented for this visit   There is currently no information documented on the homunculus. Go to the Rheumatology activity and complete the homunculus joint exam.  Investigation: No additional findings.  Imaging: No results found.  Recent Labs: Lab Results  Component Value Date   WBC 4.3 12/20/2023   HGB 14.0 12/20/2023   PLT 219 12/20/2023   NA 141 12/20/2023   K 4.6 12/20/2023   CL 104 12/20/2023   CO2 27 12/20/2023   GLUCOSE 89 12/20/2023   BUN 20 12/20/2023   CREATININE 0.62 12/20/2023   BILITOT 0.5 12/20/2023   ALKPHOS 85 10/27/2016   AST 20 12/20/2023   ALT 24 12/20/2023   PROT 6.5 12/20/2023   ALBUMIN 4.1 10/27/2016   CALCIUM 9.5 12/20/2023   GFRAA 96 12/06/2020    Speciality Comments: No specialty comments available.  Procedures:  Trigger Point Inj  Date/Time: 04/02/2024 4:00 PM  Performed by: Dolphus Reiter, MD Authorized by: Dolphus Reiter, MD   Consent Given by:  Patient Site marked: the  procedure site was marked   Timeout: prior to procedure the correct patient, procedure, and site was verified   Indications:  Muscle spasm and pain Total # of Trigger Points:  2 Location: neck   Needle Size:  27 G Approach:  Dorsal Medications #1:  0.5 mL lidocaine  1 %; 20 mg triamcinolone  acetonide 40 MG/ML Medications #2:  0.5 mL lidocaine  1 %; 20 mg triamcinolone  acetonide 40 MG/ML Patient tolerance:  Patient tolerated the procedure well with no immediate complications Comments: Risk of infection, tendon injury, nerve injury, discoloration and dermal atrophy were discussed.  Allergies: Divalproex sodium, Meloxicam, and Valproic acid   Assessment / Plan:     Visit Diagnoses: Neck pain-status post fusion.  She continues to have discomfort in her bilateral trapezius region.  Trapezius muscle spasm -she is on on Flexeril 5 mg p.o. every 8 hours as needed muscle spasm.  Patient states she tried physical therapy for 7 months after the cervical spine fusion without much relief.  She has been also using TENS unit and patches.  She requested bilateral trigger point injections in the trapezius region.  Per patient's request after informed consent was obtained bilateral trapezius region was injected with lidocaine  and Kenalog  as described above.  Patient tolerated the procedure well.  Postprocedure instructions were given.  DDD (degenerative disc disease), cervical - S/P ACDF, C5-C6 and C6-C7 fusion by Dr. Mavis April 16, 2023.  She noted improvement in the left-sided radiculopathy but no improvement in the neck pain.  Primary osteoarthritis of both hands -she continues to have some discomfort in her hands..  X-ray showed bilateral second MCP joint narrowing.  We discussed ultrasound examination in the future to look for synovitis if she has persistent symptoms.  Trochanteric bursitis of both hips-80-minute stretches were discussed.  Primary osteoarthritis of both knees - Status post Euflexxa  April 2024.  Good response to Visco supplement injections.  Doing well.  Fibromyalgia-she continues to have some generalized pain and discomfort from fibromyalgia.  Need for regular exercise was emphasized.  History of fatigue-related to insomnia and fibromyalgia.  History of insomnia-good sleep hygiene was discussed.  Osteopenia of multiple sites - DEXA 09/01/21: The BMD measured at Forearm Radius 33% is 0.733 g/cm2 with a T-scoreof -1.6---ordered by Dr. Perri.  She may need repeat DEXA scan.  Use of calcium rich diet and vitamin D  was discussed.  Vitamin D  deficiency-she takes vitamin D  on a regular basis.  History of migraine  History of depression  History of sleep apnea  Orders: Orders Placed This Encounter  Procedures   Trigger Point Inj   No orders of the defined types were placed in this encounter.    Follow-Up Instructions: Return in about 6 months (around 10/03/2024) for Osteoarthritis.   Maya Nash, MD  Note - This record has been created using Animal nutritionist.  Chart creation errors have been sought, but may not always  have been located. Such creation errors do not reflect on  the standard of medical care.

## 2024-03-28 NOTE — Procedures (Signed)
Mask fit

## 2024-04-02 ENCOUNTER — Ambulatory Visit: Attending: Rheumatology | Admitting: Rheumatology

## 2024-04-02 ENCOUNTER — Encounter: Payer: Self-pay | Admitting: Rheumatology

## 2024-04-02 VITALS — BP 127/86 | HR 78 | Resp 16 | Ht 65.0 in | Wt 195.8 lb

## 2024-04-02 DIAGNOSIS — E559 Vitamin D deficiency, unspecified: Secondary | ICD-10-CM | POA: Insufficient documentation

## 2024-04-02 DIAGNOSIS — M19042 Primary osteoarthritis, left hand: Secondary | ICD-10-CM | POA: Insufficient documentation

## 2024-04-02 DIAGNOSIS — M19041 Primary osteoarthritis, right hand: Secondary | ICD-10-CM | POA: Insufficient documentation

## 2024-04-02 DIAGNOSIS — M5030 Other cervical disc degeneration, unspecified cervical region: Secondary | ICD-10-CM | POA: Diagnosis not present

## 2024-04-02 DIAGNOSIS — M542 Cervicalgia: Secondary | ICD-10-CM

## 2024-04-02 DIAGNOSIS — M797 Fibromyalgia: Secondary | ICD-10-CM | POA: Diagnosis not present

## 2024-04-02 DIAGNOSIS — Z87898 Personal history of other specified conditions: Secondary | ICD-10-CM | POA: Diagnosis not present

## 2024-04-02 DIAGNOSIS — M170 Bilateral primary osteoarthritis of knee: Secondary | ICD-10-CM | POA: Diagnosis not present

## 2024-04-02 DIAGNOSIS — M7061 Trochanteric bursitis, right hip: Secondary | ICD-10-CM | POA: Insufficient documentation

## 2024-04-02 DIAGNOSIS — M62838 Other muscle spasm: Secondary | ICD-10-CM | POA: Diagnosis not present

## 2024-04-02 DIAGNOSIS — M8589 Other specified disorders of bone density and structure, multiple sites: Secondary | ICD-10-CM | POA: Diagnosis not present

## 2024-04-02 DIAGNOSIS — M7062 Trochanteric bursitis, left hip: Secondary | ICD-10-CM | POA: Diagnosis not present

## 2024-04-02 DIAGNOSIS — Z8659 Personal history of other mental and behavioral disorders: Secondary | ICD-10-CM | POA: Diagnosis not present

## 2024-04-02 DIAGNOSIS — Z8669 Personal history of other diseases of the nervous system and sense organs: Secondary | ICD-10-CM | POA: Diagnosis not present

## 2024-04-02 MED ORDER — LIDOCAINE HCL 1 % IJ SOLN
0.5000 mL | INTRAMUSCULAR | Status: AC | PRN
  Administered 2024-04-02: .5 mL

## 2024-04-02 MED ORDER — TRIAMCINOLONE ACETONIDE 40 MG/ML IJ SUSP
20.0000 mg | INTRAMUSCULAR | Status: AC | PRN
  Administered 2024-04-02: 20 mg via INTRAMUSCULAR

## 2024-04-29 DIAGNOSIS — G43109 Migraine with aura, not intractable, without status migrainosus: Secondary | ICD-10-CM | POA: Diagnosis not present

## 2024-04-29 DIAGNOSIS — Z79891 Long term (current) use of opiate analgesic: Secondary | ICD-10-CM | POA: Diagnosis not present

## 2024-04-29 DIAGNOSIS — M47812 Spondylosis without myelopathy or radiculopathy, cervical region: Secondary | ICD-10-CM | POA: Diagnosis not present

## 2024-04-29 DIAGNOSIS — G894 Chronic pain syndrome: Secondary | ICD-10-CM | POA: Diagnosis not present

## 2024-05-01 ENCOUNTER — Other Ambulatory Visit: Payer: Self-pay

## 2024-05-01 DIAGNOSIS — I712 Thoracic aortic aneurysm, without rupture, unspecified: Secondary | ICD-10-CM

## 2024-05-27 ENCOUNTER — Ambulatory Visit: Admitting: Rheumatology

## 2024-05-30 ENCOUNTER — Other Ambulatory Visit: Payer: Self-pay | Admitting: Internal Medicine

## 2024-06-10 ENCOUNTER — Ambulatory Visit (HOSPITAL_COMMUNITY)
Admission: RE | Admit: 2024-06-10 | Discharge: 2024-06-10 | Disposition: A | Discharge status: Home / Self Care | Source: Ambulatory Visit

## 2024-06-10 DIAGNOSIS — R911 Solitary pulmonary nodule: Secondary | ICD-10-CM | POA: Insufficient documentation

## 2024-06-10 DIAGNOSIS — I712 Thoracic aortic aneurysm, without rupture, unspecified: Secondary | ICD-10-CM | POA: Diagnosis present

## 2024-06-10 DIAGNOSIS — J984 Other disorders of lung: Secondary | ICD-10-CM | POA: Diagnosis not present

## 2024-06-10 DIAGNOSIS — R918 Other nonspecific abnormal finding of lung field: Secondary | ICD-10-CM | POA: Diagnosis not present

## 2024-06-10 DIAGNOSIS — I700 Atherosclerosis of aorta: Secondary | ICD-10-CM | POA: Insufficient documentation

## 2024-06-10 DIAGNOSIS — I77810 Thoracic aortic ectasia: Secondary | ICD-10-CM | POA: Diagnosis not present

## 2024-06-10 DIAGNOSIS — K7689 Other specified diseases of liver: Secondary | ICD-10-CM | POA: Diagnosis not present

## 2024-06-10 MED ORDER — IOHEXOL 350 MG/ML SOLN
75.0000 mL | Freq: Once | INTRAVENOUS | Status: AC | PRN
  Administered 2024-06-10: 75 mL via INTRAVENOUS

## 2024-06-12 DIAGNOSIS — H04123 Dry eye syndrome of bilateral lacrimal glands: Secondary | ICD-10-CM | POA: Diagnosis not present

## 2024-06-12 DIAGNOSIS — Z961 Presence of intraocular lens: Secondary | ICD-10-CM | POA: Diagnosis not present

## 2024-06-12 DIAGNOSIS — D3132 Benign neoplasm of left choroid: Secondary | ICD-10-CM | POA: Diagnosis not present

## 2024-06-16 DIAGNOSIS — M545 Low back pain, unspecified: Secondary | ICD-10-CM | POA: Diagnosis not present

## 2024-06-18 ENCOUNTER — Ambulatory Visit

## 2024-06-18 VITALS — BP 146/82 | HR 78 | Resp 18 | Ht 65.0 in

## 2024-06-18 DIAGNOSIS — I712 Thoracic aortic aneurysm, without rupture, unspecified: Secondary | ICD-10-CM | POA: Diagnosis not present

## 2024-06-18 NOTE — Progress Notes (Signed)
 [address] [place] [address]             [phone]            Kari Flynn [id number] Nov 02, 1950   History of Present Illness:   Kari Flynn is a 73 year old female with medical history of migraine, OSA, osteoarthritis, cervical spondylosis without myelopathy, and fibromyalgia who returns for continued follow up of aortic ectasia.  This was incidentally found one year ago on a calcium scoring CT scan.  Ectasia has stayed stable in size and on recent CTA of chest measured 3.8 cm.   She presents to the clinic today with her husband.  She started having back pain almost 1 week ago and was recently seen by orthopedics.  She has slipped her L4 and has been having pretty significant pain rating down her left leg.  She is currently on prednisone and trying to use tramadol for the pain which is not helping much.  She has an appointment with her neurosurgeon to hopefully discuss next steps.  Her blood pressure is elevated today due to pain.  she normally reports readings 130s or less/80s.  She was walking for exercise and doing water aerobics classes before her injury.  She does report some dyspnea on exertion for the last 2 years.  She does see a pulmonologist and her next appointment is in December 2025.      Current Outpatient Medications on File Prior to Visit  Medication Sig Dispense Refill   Acetaminophen (TYLENOL PO) Take by mouth as needed.     Alpha-Lipoic Acid 200 MG CAPS Take by mouth.     aspirin 81 MG tablet Take 81 mg by mouth daily.     B Complex-Biotin-FA (B-COMPLEX PO) Take by mouth.     BIOTIN PO Take 2 tablets by mouth daily.     buPROPion  (WELLBUTRIN  XL) 300 MG 24 hr tablet TAKE 1 TABLET BY MOUTH EVERY DAY 90 tablet 1   Calcium Carbonate-Vit D-Min (CALCIUM 1200 PO) Take by mouth.     CELEBREX 100 MG capsule Take 100 mg by mouth 2 (two) times daily.     Cholecalciferol (VITAMIN D ) 2000 UNITS CAPS Take 1 capsule by mouth daily.      FIORICET 50-300-40 MG CAPS Take 1 capsule by mouth as needed.     fluticasone (FLONASE) 50 MCG/ACT nasal spray Place 1 spray into both nostrils as needed for allergies or rhinitis.     gabapentin (NEURONTIN) 300 MG capsule Take 300 mg by mouth 3 (three) times daily.     hyoscyamine  (LEVSIN /SL) 0.125 MG SL tablet Place 1 tablet (0.125 mg total) under the tongue every 6 (six) hours as needed. 90 tablet 1   meclizine  (ANTIVERT ) 25 MG tablet Take 1 tablet (25 mg total) by mouth 3 (three) times daily as needed for dizziness. 30 tablet 0   methadone  (DOLOPHINE ) 5 MG tablet Take 5 mg by mouth every 8 (eight) hours. (Patient taking differently: Take 5 mg by mouth every 6 (six) hours as needed.)     MISC NATURAL PRODUCTS PO Take 2 tablets by mouth daily. Avocado300 Soy Unsaponifiables     modafinil  (PROVIGIL ) 200 MG tablet TAKE ONE TABLET BY MOUTH DAILY AS NEEDED FOR ALERTNESS (Patient taking differently: TAKE 1/2 TABLET BY MOUTH DAILY AS NEEDED FOR ALERTNESS) 30 tablet 5   Multiple Vitamins-Minerals (OCUVITE ADULT 50+ PO) Take by mouth.  polyethylene glycol powder (GLYCOLAX /MIRALAX ) 17 GM/SCOOP powder Take by mouth as needed.     predniSONE (DELTASONE) 10 MG tablet Take 10 mg by mouth.     traMADol (ULTRAM) 50 MG tablet Take 50 mg by mouth every 6 (six) hours as needed.     TURMERIC CURCUMIN PO Take by mouth in the morning and at bedtime.     No current facility-administered medications on file prior to visit.     ROS: Review of Systems  Respiratory:  Positive for shortness of breath. Negative for cough and wheezing.        On exertion  Cardiovascular: Negative.  Negative for chest pain and leg swelling.  Musculoskeletal:  Positive for back pain and joint pain.  Neurological:  Negative for headaches.     BP (!) 146/82   Pulse 78   Resp 18   Ht 5' 5 (1.651 m)   SpO2 96% Comment: RA  BMI 32.58 kg/m   Physical Exam Constitutional:      Appearance: Normal appearance.  HENT:      Head: Normocephalic and atraumatic.  Cardiovascular:     Rate and Rhythm: Normal rate and regular rhythm.     Heart sounds: Normal heart sounds, S1 normal and S2 normal.  Pulmonary:     Effort: Pulmonary effort is normal.     Breath sounds: Normal breath sounds.  Musculoskeletal:     Cervical back: Normal range of motion.  Skin:    General: Skin is warm and dry.  Neurological:     General: No focal deficit present.     Mental Status: She is alert.      Imaging: CLINICAL DATA:  Thoracic aortic aneurysm suspected   EXAM: CT ANGIOGRAPHY CHEST WITH CONTRAST   TECHNIQUE: Multidetector CT imaging of the chest was performed using the standard protocol during bolus administration of intravenous contrast. Multiplanar CT image reconstructions and MIPs were obtained to evaluate the vascular anatomy.   RADIATION DOSE REDUCTION: This exam was performed according to the departmental dose-optimization program which includes automated exposure control, adjustment of the mA and/or kV according to patient size and/or use of iterative reconstruction technique.   CONTRAST:  75mL OMNIPAQUE  IOHEXOL  350 MG/ML SOLN   COMPARISON:  Prior cardiac CT scan 03/15/2023   FINDINGS: Cardiovascular: Conventional 3 vessel arch anatomy. Stable ectasia of the ascending thoracic aorta at 3.8 cm. No frank aneurysm. No evidence of dissection. Trace atherosclerotic plaque. The heart is normal in size. No pericardial effusion. Unremarkable pulmonary arteries.   Mediastinum/Nodes: Unremarkable CT appearance of the thyroid  gland. No suspicious mediastinal or hilar adenopathy. No soft tissue mediastinal mass. The thoracic esophagus is unremarkable.   Lungs/Pleura: Increasing conspicuity of a 7 mm nodule within the left upper lobe (image 65 series 302). Finding was similar in size but less dense on the prior CT scan. Additional tiny 2 mm pulmonary nodules are noted but remain unchanged and are likely  benign. Otherwise, the lungs are clear.   Upper Abdomen: No acute abnormality.  Stable simple hepatic cysts.   Musculoskeletal: Dextroconvex scoliosis of the thoracic spine with mild multilevel degenerative changes. No acute fracture or abnormality.   Review of the MIP images confirms the above findings.   IMPRESSION: 1. Stable ectasia of the ascending thoracic aorta with a maximal diameter of 3.8 cm. No specific follow-up imaging is recommended. 2. Trace aortic atherosclerotic plaque. 3. Increasing density of a 7 mm nodule within the left upper lobe (image 65 series 302). This may simply  reflect slight differences in volume averaging given that the nodule is otherwise stable in size. However, follow-up is recommended. Non-contrast chest CT at 6-12 months is recommended. If the nodule is stable at time of repeat CT, then future CT at 18-24 months (from today's scan) is considered optional for low-risk patients, but is recommended for high-risk patients. This recommendation follows the consensus statement: Guidelines for Management of Incidental Pulmonary Nodules Detected on CT Images: From the Fleischner Society 2017; Radiology 2017; 284:228-243. 4. Additional ancillary findings as above.   Aortic Atherosclerosis (ICD10-I70.0).     Electronically Signed   By: Wilkie Lent M.D.   On: 06/10/2024 12:40     A/P: Thoracic aortic aneurysm without rupture, unspecified part -3.8 cm ectasia of the ascending thoracic aorta on CTA of chest.We discussed the natural history and and risk factors for growth of ascending aortic aneurysms. Discussed recommendations to minimize the risk of further expansion or dissection including careful blood pressure control, avoidance of contact sports and heavy lifting, attention to lipid management.  We covered the importance of continued smoking cessation.  The patient does not yet meet surgical criteria of >5.5cm. The patient is aware of signs and  symptoms of aortic dissection and when to present to the emergency department  -Blood pressure elevated today due to pain. Continue to check home BP readings and report elevated readings to PCP.     Pulmonary Nodule - 7 mm nodule within the left upper lobe -No other CT scan studies in chart to compare.   -Follow up in one year with Non-Contrast CT scan for continued surveillance of aortic aneurysm and pulmonary nodule   Risk Modification:  Statin:  not currently prescribed  Smoking cessation instruction/counseling given:  commended patient for quitting and reviewed strategies for preventing relapses  Patient was counseled on importance of Blood Pressure Control  They are instructed to contact their Primary Care Physician if they start to have blood pressure readings over 130s/90s. Do not ever stop blood pressure medications on your own, unless instructed by healthcare professional.  Please avoid use of Fluoroquinolones as this can potentially increase your risk of Aortic Rupture and/or Dissection  Patient educated on signs and symptoms of Aortic Dissection, handout also provided in AVS  Manuelita CHRISTELLA Rough, PA-C 06/18/24

## 2024-06-18 NOTE — Patient Instructions (Signed)

## 2024-06-19 DIAGNOSIS — M4316 Spondylolisthesis, lumbar region: Secondary | ICD-10-CM | POA: Diagnosis not present

## 2024-06-19 DIAGNOSIS — M5416 Radiculopathy, lumbar region: Secondary | ICD-10-CM | POA: Diagnosis not present

## 2024-06-25 DIAGNOSIS — M4316 Spondylolisthesis, lumbar region: Secondary | ICD-10-CM | POA: Diagnosis not present

## 2024-06-25 DIAGNOSIS — M25552 Pain in left hip: Secondary | ICD-10-CM | POA: Diagnosis not present

## 2024-06-25 DIAGNOSIS — M48061 Spinal stenosis, lumbar region without neurogenic claudication: Secondary | ICD-10-CM | POA: Diagnosis not present

## 2024-06-25 DIAGNOSIS — M545 Low back pain, unspecified: Secondary | ICD-10-CM | POA: Diagnosis not present

## 2024-06-25 DIAGNOSIS — M5116 Intervertebral disc disorders with radiculopathy, lumbar region: Secondary | ICD-10-CM | POA: Diagnosis not present

## 2024-06-25 DIAGNOSIS — M5115 Intervertebral disc disorders with radiculopathy, thoracolumbar region: Secondary | ICD-10-CM | POA: Diagnosis not present

## 2024-06-25 DIAGNOSIS — M25569 Pain in unspecified knee: Secondary | ICD-10-CM | POA: Diagnosis not present

## 2024-06-25 DIAGNOSIS — M5416 Radiculopathy, lumbar region: Secondary | ICD-10-CM | POA: Diagnosis not present

## 2024-06-25 DIAGNOSIS — M4727 Other spondylosis with radiculopathy, lumbosacral region: Secondary | ICD-10-CM | POA: Diagnosis not present

## 2024-06-25 DIAGNOSIS — M4726 Other spondylosis with radiculopathy, lumbar region: Secondary | ICD-10-CM | POA: Diagnosis not present

## 2024-06-25 DIAGNOSIS — M79605 Pain in left leg: Secondary | ICD-10-CM | POA: Diagnosis not present

## 2024-06-30 DIAGNOSIS — Z79891 Long term (current) use of opiate analgesic: Secondary | ICD-10-CM | POA: Diagnosis not present

## 2024-06-30 DIAGNOSIS — M4316 Spondylolisthesis, lumbar region: Secondary | ICD-10-CM | POA: Diagnosis not present

## 2024-06-30 DIAGNOSIS — G43109 Migraine with aura, not intractable, without status migrainosus: Secondary | ICD-10-CM | POA: Diagnosis not present

## 2024-06-30 DIAGNOSIS — G894 Chronic pain syndrome: Secondary | ICD-10-CM | POA: Diagnosis not present

## 2024-07-14 DIAGNOSIS — Z23 Encounter for immunization: Secondary | ICD-10-CM | POA: Diagnosis not present

## 2024-07-15 DIAGNOSIS — M4316 Spondylolisthesis, lumbar region: Secondary | ICD-10-CM | POA: Diagnosis not present

## 2024-07-18 ENCOUNTER — Other Ambulatory Visit: Payer: Self-pay | Admitting: Neurosurgery

## 2024-08-11 ENCOUNTER — Other Ambulatory Visit: Payer: Self-pay | Admitting: Internal Medicine

## 2024-08-11 NOTE — Progress Notes (Signed)
 Surgical Instructions   Your procedure is scheduled on Thursday August 14, 2024. Report to [facility] Main Entrance A at 5:30 A.M., then check in with the Admitting office. Any questions or running late day of surgery: call [phone]  Questions prior to your surgery date: call [phone], Monday-Friday, 8am-4pm. If you experience any cold or flu symptoms such as cough, fever, chills, shortness of breath, etc. between now and your scheduled surgery, please notify us  at the above number.     Remember:  Do not eat or drink after midnight the night before your surgery  Take these medicines the morning of surgery with A SIP OF WATER  buPROPion  (WELLBUTRIN  XL)  gabapentin  (NEURONTIN )    May take these medicines IF NEEDED: acetaminophen  (TYLENOL )  FIORICET  fluticasone  (FLONASE )  hyoscyamine  (LEVSIN /SL)  meclizine  (ANTIVERT )  oxyCODONE -acetaminophen  (PERCOCET)    PER YOUR SURGEON'S INSTRUCTIONS STOP YOUR ASPIRIN AND CELEBREX 5 DAYS PRIOR TO SURGERY, WITH THE LAST DOSE BEING 1212/2025.   FOLLOW YOUR PRESCRIBERS INSTRUCTIONS RE: YOUR methadone  (DOLOPHINE ).     One week prior to surgery, STOP taking any Aleve, Naproxen, Ibuprofen, Motrin, Advil, Goody's, BC's, all herbal medications, fish oil, and non-prescription vitamins.                     Do NOT Smoke (Tobacco/Vaping) for 24 hours prior to your procedure.  If you use a CPAP at night, you may bring your mask/headgear for your overnight stay.   You will be asked to remove any contacts, glasses, piercing's, hearing aid's, dentures/partials prior to surgery. Please bring cases for these items if needed.    Patients discharged the day of surgery will not be allowed to drive home, and someone needs to stay with them for 24 hours.  SURGICAL WAITING ROOM VISITATION Patients may have no more than 2 support people in the waiting area - these visitors may rotate.   Pre-op nurse will coordinate an appropriate time for 1 ADULT  support person, who may not rotate, to accompany patient in pre-op.  Children under the age of 25 must have an adult with them who is not the patient and must remain in the main waiting area with an adult.  If the patient needs to stay at the hospital during part of their recovery, the visitor guidelines for inpatient rooms apply.  Please refer to the [facility] website for the visitor guidelines for any additional information.   If you received a COVID test during your pre-op visit  it is requested that you wear a mask when out in public, stay away from anyone that may not be feeling well and notify your surgeon if you develop symptoms. If you have been in contact with anyone that has tested positive in the last 10 days please notify you surgeon.      Pre-operative 4 CHG Bathing Instructions   You can play a key role in reducing the risk of infection after surgery. Your skin needs to be as free of germs as possible. You can reduce the number of germs on your skin by washing with CHG (chlorhexidine  gluconate) soap before surgery. CHG is an antiseptic soap that kills germs and continues to kill germs even after washing.   DO NOT use if you have an allergy to chlorhexidine /CHG or antibacterial soaps. If your skin becomes reddened or irritated, stop using the CHG and notify one of our RNs at [phone].   Please shower with the CHG soap starting 4 days before  surgery using the following schedule:     Please keep in mind the following:  DO NOT shave, including legs and underarms, starting the day of your first shower.   Place clean sheets on your bed the day you start using CHG soap. Use a clean washcloth (not used since being washed) for each shower. DO NOT sleep with pets once you start using the CHG.   CHG Shower Instructions:  Wash your face and private area with normal soap. If you choose to wash your hair, wash first with your normal shampoo.  After you use shampoo/soap, rinse  your hair and body thoroughly to remove shampoo/soap residue.  Turn the water OFF and apply  bottle of CHG soap to a CLEAN washcloth.  Apply CHG soap ONLY FROM YOUR NECK DOWN TO YOUR TOES (washing for 3-5 minutes)  DO NOT use CHG soap on face, private areas, open wounds, or sores.  Pay special attention to the area where your surgery is being performed.  If you are having back surgery, having someone wash your back for you may be helpful. Wait 2 minutes after CHG soap is applied, then you may rinse off the CHG soap.  Pat dry with a clean towel  Put on clean clothes/pajamas   If you choose to wear lotion, please use ONLY the CHG-compatible lotions that are listed below.  Additional instructions for the day of surgery:  If you choose, you may shower the morning of surgery with an antibacterial soap.  DO NOT APPLY any lotions, deodorants or perfumes.   Do not bring valuables to the hospital. [facility] is not responsible for any belongings/valuables. Do not wear nail polish, gel polish, artificial nails, or any other type of covering on natural nails (fingers and toes) Do not wear jewelry or makeup Put on clean/comfortable clothes.  Please brush your teeth.  Ask your nurse before applying any prescription medications to the skin.     CHG Compatible Lotions   Aveeno Moisturizing lotion  Cetaphil Moisturizing Cream  Cetaphil Moisturizing Lotion  Clairol Herbal Essence Moisturizing Lotion, Dry Skin  Clairol Herbal Essence Moisturizing Lotion, Extra Dry Skin  Clairol Herbal Essence Moisturizing Lotion, Normal Skin  Curel Age Defying Therapeutic Moisturizing Lotion with Alpha Hydroxy  Curel Extreme Care Body Lotion  Curel Soothing Hands Moisturizing Hand Lotion  Curel Therapeutic Moisturizing Cream, Fragrance-Free  Curel Therapeutic Moisturizing Lotion, Fragrance-Free  Curel Therapeutic Moisturizing Lotion, Original Formula  Eucerin Daily Replenishing Lotion  Eucerin Dry Skin  Therapy Plus Alpha Hydroxy Crme  Eucerin Dry Skin Therapy Plus Alpha Hydroxy Lotion  Eucerin Original Crme  Eucerin Original Lotion  Eucerin Plus Crme Eucerin Plus Lotion  Eucerin TriLipid Replenishing Lotion  Keri Anti-Bacterial Hand Lotion  Keri Deep Conditioning Original Lotion Dry Skin Formula Softly Scented  Keri Deep Conditioning Original Lotion, Fragrance Free Sensitive Skin Formula  Keri Lotion Fast Absorbing Fragrance Free Sensitive Skin Formula  Keri Lotion Fast Absorbing Softly Scented Dry Skin Formula  Keri Original Lotion  Keri Skin Renewal Lotion Keri Silky Smooth Lotion  Keri Silky Smooth Sensitive Skin Lotion  Nivea Body Creamy Conditioning Oil  Nivea Body Extra Enriched Lotion  Nivea Body Original Lotion  Nivea Body Sheer Moisturizing Lotion Nivea Crme  Nivea Skin Firming Lotion  NutraDerm 30 Skin Lotion  NutraDerm Skin Lotion  NutraDerm Therapeutic Skin Cream  NutraDerm Therapeutic Skin Lotion  ProShield Protective Hand Cream  Provon moisturizing lotion  Please read over the following fact sheets that you were given.

## 2024-08-12 ENCOUNTER — Inpatient Hospital Stay (HOSPITAL_COMMUNITY): Admission: RE | Admit: 2024-08-12 | Discharge: 2024-08-12 | Attending: Neurosurgery

## 2024-08-12 ENCOUNTER — Other Ambulatory Visit: Payer: Self-pay

## 2024-08-12 ENCOUNTER — Encounter (HOSPITAL_COMMUNITY): Payer: Self-pay

## 2024-08-12 VITALS — BP 124/60 | HR 75 | Temp 97.8°F | Resp 18 | Ht 65.0 in | Wt 199.4 lb

## 2024-08-12 DIAGNOSIS — Z01818 Encounter for other preprocedural examination: Secondary | ICD-10-CM | POA: Insufficient documentation

## 2024-08-12 HISTORY — DX: Fibromyalgia: M79.7

## 2024-08-12 LAB — TYPE AND SCREEN
ABO/RH(D): O POS
Antibody Screen: NEGATIVE

## 2024-08-12 LAB — SURGICAL PCR SCREEN
MRSA, PCR: NEGATIVE
Staphylococcus aureus: NEGATIVE

## 2024-08-12 LAB — CBC
HCT: 44.2 % (ref 36.0–46.0)
Hemoglobin: 14.3 g/dL (ref 12.0–15.0)
MCH: 31.1 pg (ref 26.0–34.0)
MCHC: 32.4 g/dL (ref 30.0–36.0)
MCV: 96.1 fL (ref 80.0–100.0)
Platelets: 239 K/uL (ref 150–400)
RBC: 4.6 MIL/uL (ref 3.87–5.11)
RDW: 12.8 % (ref 11.5–15.5)
WBC: 8.5 K/uL (ref 4.0–10.5)
nRBC: 0 % (ref 0.0–0.2)

## 2024-08-12 NOTE — Telephone Encounter (Signed)
Modafinil refilled.

## 2024-08-12 NOTE — Progress Notes (Signed)
 PCP - Perri Ronal PARAS, MD   Cardiologist -  Pulmonary - Neysa Reggy BIRCH, MD   PPM/ICD - denies Device Orders - n/a Rep Notified - n/a  Chest x-ray - denies EKG - denies Stress Test - denies ECHO - denies Cardiac Cath - denies  Sleep Study -  per patient 2008 CPAP - uses nightly  Dm- denies  Blood Thinner Instructions: Aspirin Instructions: LAST DOSE 08-09-24 CELEBREX - LAST DOSE 08-09-24 ERAS Protcol - NPO  COVID TEST- n/a  Anesthesia review: yes, OSA, aortic ectasia  Patient denies shortness of breath, fever, cough and chest pain at PAT appointment   All instructions explained to the patient, with a verbal understanding of the material. Patient agrees to go over the instructions while at home for a better understanding. Patient also instructed to self quarantine after being tested for COVID-19. The opportunity to ask questions was provided.

## 2024-08-14 ENCOUNTER — Ambulatory Visit (HOSPITAL_COMMUNITY)

## 2024-08-14 ENCOUNTER — Ambulatory Visit (HOSPITAL_COMMUNITY): Admission: RE | Disposition: A | Payer: Self-pay | Source: Home / Self Care | Attending: Neurosurgery

## 2024-08-14 ENCOUNTER — Ambulatory Visit (HOSPITAL_COMMUNITY)
Admission: RE | Admit: 2024-08-14 | Discharge: 2024-08-15 | Disposition: A | Attending: Neurosurgery | Admitting: Neurosurgery

## 2024-08-14 ENCOUNTER — Ambulatory Visit (HOSPITAL_BASED_OUTPATIENT_CLINIC_OR_DEPARTMENT_OTHER): Payer: Self-pay | Admitting: Anesthesiology

## 2024-08-14 ENCOUNTER — Encounter (HOSPITAL_COMMUNITY): Payer: Self-pay | Admitting: Anesthesiology

## 2024-08-14 DIAGNOSIS — G43909 Migraine, unspecified, not intractable, without status migrainosus: Secondary | ICD-10-CM

## 2024-08-14 DIAGNOSIS — M48062 Spinal stenosis, lumbar region with neurogenic claudication: Secondary | ICD-10-CM | POA: Insufficient documentation

## 2024-08-14 DIAGNOSIS — M5116 Intervertebral disc disorders with radiculopathy, lumbar region: Secondary | ICD-10-CM | POA: Insufficient documentation

## 2024-08-14 DIAGNOSIS — Z87891 Personal history of nicotine dependence: Secondary | ICD-10-CM | POA: Diagnosis not present

## 2024-08-14 DIAGNOSIS — G4733 Obstructive sleep apnea (adult) (pediatric): Secondary | ICD-10-CM | POA: Diagnosis not present

## 2024-08-14 DIAGNOSIS — M4316 Spondylolisthesis, lumbar region: Secondary | ICD-10-CM | POA: Diagnosis present

## 2024-08-14 LAB — ABO/RH: ABO/RH(D): O POS

## 2024-08-14 SURGERY — POSTERIOR LUMBAR FUSION 1 LEVEL
Anesthesia: General

## 2024-08-14 MED ORDER — OXYCODONE-ACETAMINOPHEN 5-325 MG PO TABS
1.0000 | ORAL_TABLET | ORAL | Status: DC | PRN
  Administered 2024-08-14 – 2024-08-15 (×4): 1 via ORAL
  Filled 2024-08-14 (×4): qty 1

## 2024-08-14 MED ORDER — 0.9 % SODIUM CHLORIDE (POUR BTL) OPTIME
TOPICAL | Status: DC | PRN
  Administered 2024-08-14: 09:00:00 1000 mL

## 2024-08-14 MED ORDER — ORAL CARE MOUTH RINSE: 15.0000 mL | Freq: Once | OROMUCOSAL | Status: AC

## 2024-08-14 MED ORDER — PHENYLEPHRINE HCL-NACL 20-0.9 MG/250ML-% IV SOLN
INTRAVENOUS | Status: DC | PRN
  Administered 2024-08-14: 09:00:00 30 ug/min via INTRAVENOUS

## 2024-08-14 MED ORDER — KETAMINE HCL 50 MG/5ML IJ SOSY
PREFILLED_SYRINGE | INTRAMUSCULAR | Status: AC
  Filled 2024-08-14: qty 5

## 2024-08-14 MED ORDER — ACETAMINOPHEN 10 MG/ML IV SOLN
INTRAVENOUS | Status: DC | PRN
  Administered 2024-08-14: 12:00:00 1000 mg via INTRAVENOUS

## 2024-08-14 MED ORDER — ZOLPIDEM TARTRATE 5 MG PO TABS: 5.0000 mg | ORAL_TABLET | Freq: Every evening | ORAL | Status: DC | PRN

## 2024-08-14 MED ORDER — LACTATED RINGERS IV SOLN: INTRAVENOUS | Status: DC

## 2024-08-14 MED ORDER — BUPROPION HCL ER (XL) 300 MG PO TB24
300.0000 mg | ORAL_TABLET | Freq: Every day | ORAL | Status: DC
  Administered 2024-08-15: 300 mg via ORAL
  Filled 2024-08-14: qty 1

## 2024-08-14 MED ORDER — LIDOCAINE 2% (20 MG/ML) 5 ML SYRINGE
INTRAMUSCULAR | Status: DC | PRN
  Administered 2024-08-14: 08:00:00 60 mg via INTRAVENOUS

## 2024-08-14 MED ORDER — FENTANYL CITRATE (PF) 100 MCG/2ML IJ SOLN
INTRAMUSCULAR | Status: AC
  Filled 2024-08-14: qty 2

## 2024-08-14 MED ORDER — THROMBIN 5000 UNITS EX KIT
PACK | CUTANEOUS | Status: AC
  Filled 2024-08-14: qty 1

## 2024-08-14 MED ORDER — PROPOFOL 10 MG/ML IV BOLUS
INTRAVENOUS | Status: AC
  Filled 2024-08-14: qty 20

## 2024-08-14 MED ORDER — THROMBIN 5000 UNITS EX SOLR
OROMUCOSAL | Status: DC | PRN
  Administered 2024-08-14: 09:00:00 5 mL via TOPICAL
  Administered 2024-08-14: 12:00:00 5 mL
  Administered 2024-08-14: 10:00:00 5 mL via TOPICAL

## 2024-08-14 MED ORDER — ONDANSETRON HCL 4 MG PO TABS
4.0000 mg | ORAL_TABLET | Freq: Four times a day (QID) | ORAL | Status: DC | PRN
  Administered 2024-08-15: 4 mg via ORAL
  Filled 2024-08-14: qty 1

## 2024-08-14 MED ORDER — METHADONE HCL 10 MG PO TABS
5.0000 mg | ORAL_TABLET | Freq: Four times a day (QID) | ORAL | Status: DC
  Administered 2024-08-14 – 2024-08-15 (×4): 5 mg via ORAL
  Filled 2024-08-14 (×4): qty 1

## 2024-08-14 MED ORDER — BACITRACIN ZINC 500 UNIT/GM EX OINT
TOPICAL_OINTMENT | CUTANEOUS | Status: DC | PRN
  Administered 2024-08-14: 12:00:00 1 via TOPICAL

## 2024-08-14 MED ORDER — FENTANYL CITRATE (PF) 250 MCG/5ML IJ SOLN
INTRAMUSCULAR | Status: DC | PRN
  Administered 2024-08-14: 13:00:00 25 ug via INTRAVENOUS
  Administered 2024-08-14: 08:00:00 50 ug via INTRAVENOUS
  Administered 2024-08-14: 12:00:00 25 ug via INTRAVENOUS
  Administered 2024-08-14 (×3): 50 ug via INTRAVENOUS

## 2024-08-14 MED ORDER — SODIUM CHLORIDE 0.9% FLUSH: 3.0000 mL | INTRAVENOUS | Status: DC | PRN

## 2024-08-14 MED ORDER — DOCUSATE SODIUM 100 MG PO CAPS
100.0000 mg | ORAL_CAPSULE | Freq: Two times a day (BID) | ORAL | Status: DC
  Administered 2024-08-14 – 2024-08-15 (×3): 100 mg via ORAL
  Filled 2024-08-14 (×3): qty 1

## 2024-08-14 MED ORDER — CYCLOBENZAPRINE HCL 10 MG PO TABS
10.0000 mg | ORAL_TABLET | Freq: Three times a day (TID) | ORAL | Status: DC | PRN
  Administered 2024-08-14: 21:00:00 10 mg via ORAL
  Filled 2024-08-14: qty 1

## 2024-08-14 MED ORDER — SODIUM CHLORIDE 0.9% FLUSH
3.0000 mL | Freq: Two times a day (BID) | INTRAVENOUS | Status: DC
  Administered 2024-08-14 – 2024-08-15 (×2): 3 mL via INTRAVENOUS

## 2024-08-14 MED ORDER — ALBUMIN HUMAN 5 % IV SOLN: INTRAVENOUS | Status: DC | PRN

## 2024-08-14 MED ORDER — BUPIVACAINE-EPINEPHRINE (PF) 0.5% -1:200000 IJ SOLN
INTRAMUSCULAR | Status: DC | PRN
  Administered 2024-08-14: 08:00:00 10 mL

## 2024-08-14 MED ORDER — DEXAMETHASONE SOD PHOSPHATE PF 10 MG/ML IJ SOLN
INTRAMUSCULAR | Status: DC | PRN
  Administered 2024-08-14: 08:00:00 10 mg via INTRAVENOUS

## 2024-08-14 MED ORDER — FENTANYL CITRATE (PF) 100 MCG/2ML IJ SOLN
25.0000 ug | INTRAMUSCULAR | Status: DC | PRN
  Administered 2024-08-14 (×2): 50 ug via INTRAVENOUS

## 2024-08-14 MED ORDER — FENTANYL CITRATE (PF) 250 MCG/5ML IJ SOLN
INTRAMUSCULAR | Status: AC
  Filled 2024-08-14: qty 5

## 2024-08-14 MED ORDER — DEXMEDETOMIDINE HCL IN NACL 80 MCG/20ML IV SOLN
INTRAVENOUS | Status: AC
  Filled 2024-08-14: qty 20

## 2024-08-14 MED ORDER — OXYCODONE HCL 5 MG PO TABS
5.0000 mg | ORAL_TABLET | ORAL | Status: DC | PRN
  Administered 2024-08-14 – 2024-08-15 (×3): 5 mg via ORAL
  Filled 2024-08-14 (×3): qty 1

## 2024-08-14 MED ORDER — DEXMEDETOMIDINE HCL IN NACL 80 MCG/20ML IV SOLN
INTRAVENOUS | Status: DC | PRN
  Administered 2024-08-14: 13:00:00 4 ug via INTRAVENOUS

## 2024-08-14 MED ORDER — ACETAMINOPHEN 325 MG PO TABS: 650.0000 mg | ORAL_TABLET | ORAL | Status: DC | PRN

## 2024-08-14 MED ORDER — ACETAMINOPHEN 650 MG RE SUPP: 650.0000 mg | RECTAL | Status: DC | PRN

## 2024-08-14 MED ORDER — PROPOFOL 10 MG/ML IV BOLUS
INTRAVENOUS | Status: DC | PRN
  Administered 2024-08-14: 08:00:00 150 mg via INTRAVENOUS
  Administered 2024-08-14: 12:00:00 30 mg via INTRAVENOUS

## 2024-08-14 MED ORDER — SODIUM CHLORIDE 0.9 % IV SOLN
250.0000 mL | INTRAVENOUS | Status: AC
  Administered 2024-08-14: 15:00:00 250 mL via INTRAVENOUS

## 2024-08-14 MED ORDER — CHLORHEXIDINE GLUCONATE CLOTH 2 % EX PADS
6.0000 | MEDICATED_PAD | Freq: Once | CUTANEOUS | Status: AC
  Administered 2024-08-14: 06:00:00 6 via TOPICAL

## 2024-08-14 MED ORDER — METHADONE HCL 10 MG/ML IJ SOLN
2.5000 mg | Freq: Once | INTRAMUSCULAR | Status: AC
  Administered 2024-08-14: 08:00:00 2.5 mg via INTRAVENOUS
  Filled 2024-08-14 (×2): qty 0.25

## 2024-08-14 MED ORDER — SUGAMMADEX SODIUM 200 MG/2ML IV SOLN
INTRAVENOUS | Status: DC | PRN
  Administered 2024-08-14 (×4): 50 mg via INTRAVENOUS

## 2024-08-14 MED ORDER — ONDANSETRON HCL 4 MG/2ML IJ SOLN
INTRAMUSCULAR | Status: DC | PRN
  Administered 2024-08-14: 12:00:00 4 mg via INTRAVENOUS

## 2024-08-14 MED ORDER — BUPIVACAINE LIPOSOME 1.3 % IJ SUSP
INTRAMUSCULAR | Status: AC
  Filled 2024-08-14: qty 20

## 2024-08-14 MED ORDER — VASHE WOUND IRRIGATION OPTIME
TOPICAL | Status: DC | PRN
  Administered 2024-08-14: 11:00:00 34 [oz_av]

## 2024-08-14 MED ORDER — MENTHOL 3 MG MT LOZG: 1.0000 | LOZENGE | OROMUCOSAL | Status: DC | PRN

## 2024-08-14 MED ORDER — CHLORHEXIDINE GLUCONATE CLOTH 2 % EX PADS: 6.0000 | MEDICATED_PAD | Freq: Once | CUTANEOUS | Status: DC

## 2024-08-14 MED ORDER — MIDAZOLAM HCL (PF) 2 MG/2ML IJ SOLN
INTRAMUSCULAR | Status: DC | PRN
  Administered 2024-08-14: 08:00:00 .5 mg via INTRAVENOUS

## 2024-08-14 MED ORDER — CEFAZOLIN SODIUM 1 G IJ SOLR
INTRAMUSCULAR | Status: AC
  Filled 2024-08-14: qty 20

## 2024-08-14 MED ORDER — CHLORHEXIDINE GLUCONATE 0.12 % MT SOLN
15.0000 mL | Freq: Once | OROMUCOSAL | Status: AC
  Administered 2024-08-14: 06:00:00 15 mL via OROMUCOSAL
  Filled 2024-08-14: qty 15

## 2024-08-14 MED ORDER — THROMBIN 20000 UNITS EX SOLR
CUTANEOUS | Status: AC
  Filled 2024-08-14: qty 20000

## 2024-08-14 MED ORDER — HYDROXYZINE HCL 50 MG/ML IM SOLN
50.0000 mg | Freq: Four times a day (QID) | INTRAMUSCULAR | Status: DC | PRN
  Administered 2024-08-14: 23:00:00 50 mg via INTRAMUSCULAR
  Filled 2024-08-14 (×3): qty 1

## 2024-08-14 MED ORDER — MORPHINE SULFATE (PF) 2 MG/ML IV SOLN: 2.0000 mg | INTRAVENOUS | Status: DC | PRN

## 2024-08-14 MED ORDER — ACETAMINOPHEN 500 MG PO TABS: 1000.0000 mg | ORAL_TABLET | Freq: Once | ORAL | Status: DC

## 2024-08-14 MED ORDER — BUPIVACAINE-EPINEPHRINE (PF) 0.5% -1:200000 IJ SOLN
INTRAMUSCULAR | Status: AC
  Filled 2024-08-14: qty 30

## 2024-08-14 MED ORDER — ROCURONIUM BROMIDE 10 MG/ML (PF) SYRINGE
PREFILLED_SYRINGE | INTRAVENOUS | Status: DC | PRN
  Administered 2024-08-14: 11:00:00 10 mg via INTRAVENOUS
  Administered 2024-08-14 (×2): 20 mg via INTRAVENOUS
  Administered 2024-08-14: 11:00:00 10 mg via INTRAVENOUS
  Administered 2024-08-14: 08:00:00 60 mg via INTRAVENOUS
  Administered 2024-08-14 (×2): 20 mg via INTRAVENOUS

## 2024-08-14 MED ORDER — POLYETHYLENE GLYCOL 3350 17 GM/SCOOP PO POWD: 17.0000 g | Freq: Every day | ORAL | Status: DC | PRN

## 2024-08-14 MED ORDER — FLUTICASONE PROPIONATE 50 MCG/ACT NA SUSP: 1.0000 | NASAL | Status: DC | PRN

## 2024-08-14 MED ORDER — ACETAMINOPHEN 500 MG PO TABS
1000.0000 mg | ORAL_TABLET | Freq: Four times a day (QID) | ORAL | Status: DC
  Administered 2024-08-14 (×2): 1000 mg via ORAL
  Administered 2024-08-15: 500 mg via ORAL
  Filled 2024-08-14 (×3): qty 2

## 2024-08-14 MED ORDER — CEFAZOLIN SODIUM-DEXTROSE 2-3 GM-%(50ML) IV SOLR
INTRAVENOUS | Status: DC | PRN
  Administered 2024-08-14: 12:00:00 2 g via INTRAVENOUS

## 2024-08-14 MED ORDER — DROPERIDOL 2.5 MG/ML IJ SOLN: 0.6250 mg | Freq: Once | INTRAMUSCULAR | Status: DC | PRN

## 2024-08-14 MED ORDER — CEFAZOLIN SODIUM-DEXTROSE 2-4 GM/100ML-% IV SOLN
2.0000 g | INTRAVENOUS | Status: AC
  Administered 2024-08-14: 08:00:00 2 g via INTRAVENOUS
  Filled 2024-08-14: qty 100

## 2024-08-14 MED ORDER — PHENOL 1.4 % MT LIQD: 1.0000 | OROMUCOSAL | Status: DC | PRN

## 2024-08-14 MED ORDER — ONDANSETRON HCL 4 MG/2ML IJ SOLN
4.0000 mg | Freq: Four times a day (QID) | INTRAMUSCULAR | Status: DC | PRN
  Administered 2024-08-14: 17:00:00 4 mg via INTRAVENOUS
  Filled 2024-08-14: qty 2

## 2024-08-14 MED ORDER — SUGAMMADEX SODIUM 200 MG/2ML IV SOLN
INTRAVENOUS | Status: AC
  Filled 2024-08-14: qty 2

## 2024-08-14 MED ORDER — THROMBIN 20000 UNITS EX SOLR
CUTANEOUS | Status: DC | PRN
  Administered 2024-08-14: 10:00:00 20 mL via TOPICAL

## 2024-08-14 MED ORDER — BACITRACIN ZINC 500 UNIT/GM EX OINT
TOPICAL_OINTMENT | CUTANEOUS | Status: AC
  Filled 2024-08-14: qty 28.35

## 2024-08-14 MED ORDER — BISACODYL 10 MG RE SUPP: 10.0000 mg | Freq: Every day | RECTAL | Status: DC | PRN

## 2024-08-14 MED ORDER — GABAPENTIN 300 MG PO CAPS
300.0000 mg | ORAL_CAPSULE | Freq: Three times a day (TID) | ORAL | Status: DC
  Administered 2024-08-14 – 2024-08-15 (×3): 300 mg via ORAL
  Filled 2024-08-14 (×3): qty 1

## 2024-08-14 MED ORDER — CEFAZOLIN SODIUM-DEXTROSE 2-4 GM/100ML-% IV SOLN
2.0000 g | Freq: Three times a day (TID) | INTRAVENOUS | Status: AC
  Administered 2024-08-14 (×2): 2 g via INTRAVENOUS
  Filled 2024-08-14 (×2): qty 100

## 2024-08-14 MED ADMIN — henylephrine-NaCl Pref Syr 0.8 MG/10ML-0.9% (80 MCG/ML): 80 ug | INTRAVENOUS | @ 09:00:00 | NDC 65302050510

## 2024-08-14 MED ADMIN — henylephrine-NaCl Pref Syr 0.8 MG/10ML-0.9% (80 MCG/ML): 80 ug | INTRAVENOUS | @ 08:00:00 | NDC 65302050510

## 2024-08-14 MED ADMIN — henylephrine-NaCl Pref Syr 0.8 MG/10ML-0.9% (80 MCG/ML): 80 ug | INTRAVENOUS | @ 12:00:00 | NDC 65302050510

## 2024-08-14 MED ADMIN — Ketamine HCl Soln Pref Syr 50 MG/5ML (10 MG/ML): 10 mg | INTRAVENOUS | @ 09:00:00 | NDC 70004043009

## 2024-08-14 MED ADMIN — Ketamine HCl Soln Pref Syr 50 MG/5ML (10 MG/ML): 10 mg | INTRAVENOUS | @ 08:00:00 | NDC 70004043009

## 2024-08-14 MED FILL — Ondansetron HCl Inj 4 MG/2ML (2 MG/ML): INTRAMUSCULAR | Qty: 2 | Status: AC

## 2024-08-14 MED FILL — Rocuronium Bromide IV Soln Pref Syr 100 MG/10ML (10 MG/ML): INTRAVENOUS | Qty: 10 | Status: AC

## 2024-08-14 MED FILL — Midazolam HCl Inj 2 MG/2ML (Base Equivalent): INTRAMUSCULAR | Qty: 2 | Status: AC

## 2024-08-14 MED FILL — Acetaminophen IV Soln 10 MG/ML: INTRAVENOUS | Qty: 100 | Status: AC

## 2024-08-14 MED FILL — Lidocaine HCl Local Soln Prefilled Syringe 100 MG/5ML (2%): INTRAMUSCULAR | Qty: 5 | Status: AC

## 2024-08-14 SURGICAL SUPPLY — 67 items
BAG COUNTER SPONGE SURGICOUNT (BAG) ×1 IMPLANT
BASKET BONE COLLECTION (BASKET) ×1 IMPLANT
BENZOIN TINCTURE PRP APPL 2/3 (GAUZE/BANDAGES/DRESSINGS) ×1 IMPLANT
BLADE CLIPPER SURG (BLADE) IMPLANT
BUR MATCHSTICK NEURO 3.0 LAGG (BURR) ×1 IMPLANT
BUR PRECISION FLUTE 6.0 (BURR) ×1 IMPLANT
CABLE BIPOLOR RESECTION CORD (MISCELLANEOUS) IMPLANT
CANISTER SUCTION 3000ML PPV (SUCTIONS) ×1 IMPLANT
CAP LOCK DLX THRD (Cap) IMPLANT
CLEANSER WND VASHE INSTL 34OZ (WOUND CARE) ×1 IMPLANT
CNTNR URN SCR LID CUP LEK RST (MISCELLANEOUS) ×1 IMPLANT
COVER BACK TABLE 60X90IN (DRAPES) ×1 IMPLANT
DRAPE C-ARM 42X72 X-RAY (DRAPES) ×2 IMPLANT
DRAPE HALF SHEET 40X57 (DRAPES) ×1 IMPLANT
DRAPE LAPAROTOMY 100X72X124 (DRAPES) ×1 IMPLANT
DRAPE SURG 17X23 STRL (DRAPES) ×1 IMPLANT
DRSG OPSITE POSTOP 4X6 (GAUZE/BANDAGES/DRESSINGS) ×1 IMPLANT
DRSG OPSITE POSTOP 4X8 (GAUZE/BANDAGES/DRESSINGS) IMPLANT
ELECTRODE BLDE 4.0 EZ CLN MEGD (MISCELLANEOUS) ×1 IMPLANT
ELECTRODE REM PT RTRN 9FT ADLT (ELECTROSURGICAL) ×1 IMPLANT
EVACUATOR 1/8 PVC DRAIN (DRAIN) ×1 IMPLANT
GAUZE 4X4 16PLY ~~LOC~~+RFID DBL (SPONGE) ×1 IMPLANT
GLOVE BIO SURGEON STRL SZ 6 (GLOVE) ×1 IMPLANT
GLOVE BIO SURGEON STRL SZ 6.5 (GLOVE) IMPLANT
GLOVE BIO SURGEON STRL SZ8 (GLOVE) ×2 IMPLANT
GLOVE BIO SURGEON STRL SZ8.5 (GLOVE) ×2 IMPLANT
GLOVE BIOGEL PI IND STRL 6.5 (GLOVE) ×1 IMPLANT
GLOVE BIOGEL PI IND STRL 7.0 (GLOVE) IMPLANT
GLOVE BIOGEL PI IND STRL 7.5 (GLOVE) IMPLANT
GLOVE SKINSENSE STRL SZ7.0 (GLOVE) IMPLANT
GLOVE SURG SS PI 6.5 STRL IVOR (GLOVE) IMPLANT
GOWN STRL REUS W/ TWL LRG LVL3 (GOWN DISPOSABLE) ×1 IMPLANT
GOWN STRL REUS W/ TWL XL LVL3 (GOWN DISPOSABLE) ×2 IMPLANT
GOWN STRL REUS W/TWL 2XL LVL3 (GOWN DISPOSABLE) IMPLANT
GOWN STRL SURGICAL XL XLNG (GOWN DISPOSABLE) IMPLANT
HEMOSTAT POWDER KIT SURGIFOAM (HEMOSTASIS) ×1 IMPLANT
KIT BASIN OR (CUSTOM PROCEDURE TRAY) ×1 IMPLANT
KIT GRAFTMAG DEL NEURO DISP (NEUROSURGERY SUPPLIES) IMPLANT
KIT POSITIONER JACKSON TABLE (MISCELLANEOUS) ×1 IMPLANT
KIT TURNOVER KIT B (KITS) ×1 IMPLANT
NDL HYPO 21X1.5 SAFETY (NEEDLE) ×1 IMPLANT
NDL HYPO 22X1.5 SAFETY MO (MISCELLANEOUS) ×1 IMPLANT
NEEDLE HYPO 21X1.5 SAFETY (NEEDLE) ×1 IMPLANT
NEEDLE HYPO 22X1.5 SAFETY MO (MISCELLANEOUS) ×1 IMPLANT
PACK LAMINECTOMY NEURO (CUSTOM PROCEDURE TRAY) ×1 IMPLANT
PAD ARMBOARD POSITIONER FOAM (MISCELLANEOUS) ×3 IMPLANT
PATTIES SURGICAL .5 X1 (DISPOSABLE) IMPLANT
PATTIES SURGICAL 1X1 (DISPOSABLE) IMPLANT
PUTTY DBM GRAFTON 5CC (Putty) IMPLANT
ROD CREO DLX CVD 6.35X40 (Rod) IMPLANT
SCREW PA DLX CREO 7.5X50 (Screw) IMPLANT
SCREW PA DLX CREO 7.5X55 (Screw) IMPLANT
SOLN 0.9% NACL POUR BTL 1000ML (IV SOLUTION) ×1 IMPLANT
SOLN STERILE WATER BTL 1000 ML (IV SOLUTION) ×1 IMPLANT
SPACER ALTERA 10X31 8-12MM-8 (Spacer) IMPLANT
SPIKE FLUID TRANSFER (MISCELLANEOUS) ×1 IMPLANT
SPONGE NEURO XRAY DETECT 1X3 (DISPOSABLE) IMPLANT
SPONGE SURGIFOAM ABS GEL 100 (HEMOSTASIS) IMPLANT
SPONGE T-LAP 4X18 ~~LOC~~+RFID (SPONGE) IMPLANT
STRIP CLOSURE SKIN 1/2X4 (GAUZE/BANDAGES/DRESSINGS) ×1 IMPLANT
SUT VIC AB 1 CT1 18XBRD ANBCTR (SUTURE) ×2 IMPLANT
SUT VIC AB 2-0 CP2 18 (SUTURE) ×2 IMPLANT
SYR 20ML LL LF (SYRINGE) IMPLANT
TOWEL GREEN STERILE (TOWEL DISPOSABLE) ×1 IMPLANT
TOWEL GREEN STERILE FF (TOWEL DISPOSABLE) ×1 IMPLANT
TRAY FOLEY MTR SLVR 14FR STAT (SET/KITS/TRAYS/PACK) IMPLANT
TRAY FOLEY MTR SLVR 16FR STAT (SET/KITS/TRAYS/PACK) ×1 IMPLANT

## 2024-08-14 NOTE — Progress Notes (Signed)
 Orthopedic Tech Progress Note Patient Details:  Kari Flynn [birth year] [id number]  Ortho Devices Type of Ortho Device: Lumbar corsett Ortho Device/Splint Location: Low Back Ortho Device/Splint Interventions: Ordered, Adjustment      [name] A Dimetrius Montfort 08/14/2024, 1:38 PM

## 2024-08-14 NOTE — H&P (Signed)
 Subjective: The patient is a 73 year old white female who is complaining of back and right greater than left leg pain consistent with neurogenic claudication.  She has failed medical management.  She was worked up with lumbar x-rays and lumbar MRI which demonstrated degenerative changes most prominent at L4-5.  I discussed the various treatment options with her.  She has decided to proceed with surgery.  Past Medical History:  Diagnosis Date   Arthritis    Dyspnea    Fibromyalgia    Lump of breast, left 11/08/2016   scheduled for a diagnostic/US  mammo bilateral and stereo, US  aspiration and ductogram.    Migraine, unspecified, without mention of intractable migraine without mention of status migrainosus    Myalgia and myositis, unspecified    Obstructive sleep apnea (adult) (pediatric)    Vertigo     Past Surgical History:  Procedure Laterality Date   ANTERIOR CERVICAL DECOMP/DISCECTOMY FUSION  04/16/2023   BLEPHAROPLASTY     BREAST BIOPSY     B9   BUNIONECTOMY     x2   CATARACT EXTRACTION, BILATERAL     01/2020, 02/2020   REFRACTIVE SURGERY Right 04/2022   laser eye surgery   WISDOM TOOTH EXTRACTION      Allergies[1]  Social History   Tobacco Use   Smoking status: Former    Current packs/day: 0.00    Average packs/day: 0.5 packs/day for 10.0 years (5.0 ttl pk-yrs)    Types: Cigarettes    Start date: 71    Quit date: 08/28/1977    Years since quitting: 46.9    Passive exposure: Never   Smokeless tobacco: Never  Substance Use Topics   Alcohol use: Yes    Comment: rarely    Family History  Problem Relation Age of Onset   Hypertension Mother    Arthritis Mother    Hyperlipidemia Mother    Melanoma Mother    Diverticulitis Mother    Multiple myeloma Father    Arthritis Other        sibling   Sarcoidosis Other        sibling-also has NIDDM   Diabetes Brother    Sarcoidosis Brother    Heart attack Brother    Chronic Renal Failure Brother    Diabetes Paternal  Grandfather    Colon cancer Neg Hx    Esophageal cancer Neg Hx    Rectal cancer Neg Hx    Stomach cancer Neg Hx    Prior to Admission medications  Medication Sig Start Date End Date Taking? Authorizing Provider  acetaminophen  (TYLENOL ) 325 MG tablet Take 650 mg by mouth every 6 (six) hours as needed for moderate pain (pain score 4-6).   Yes [provider]  aspirin 81 MG tablet Take 81 mg by mouth daily.   Yes [provider]  B Complex-Biotin-FA (B-COMPLEX PO) Take 1 tablet by mouth daily.   Yes [provider]  BIOTIN PO Take 2 tablets by mouth daily.   Yes [provider]  buPROPion  (WELLBUTRIN  XL) 300 MG 24 hr tablet TAKE 1 TABLET BY MOUTH EVERY DAY 05/30/24  Yes Baxley, Ronal PARAS, MD  Calcium Carbonate-Vit D-Min (CALCIUM 1200 PO) Take 1 tablet by mouth daily.   Yes [provider]  CELEBREX 100 MG capsule Take 100 mg by mouth 2 (two) times daily.   Yes [provider]  Cholecalciferol (VITAMIN D ) 2000 UNITS CAPS Take 2,000 Units by mouth daily.   Yes [provider]  FIORICET 50-300-40 MG CAPS  Take 1 capsule by mouth daily as needed (migraine). 05/31/21  Yes [provider]  fluticasone  (FLONASE ) 50 MCG/ACT nasal spray Place 1 spray into both nostrils as needed for allergies or rhinitis.   Yes [provider]  gabapentin  (NEURONTIN ) 300 MG capsule Take 300 mg by mouth 3 (three) times daily.   Yes [provider]  methadone  (DOLOPHINE ) 5 MG tablet Take 5 mg by mouth every 8 (eight) hours. Patient taking differently: Take 5 mg by mouth every 6 (six) hours.   Yes [provider]  modafinil  (PROVIGIL ) 200 MG tablet TAKE ONE TABLET BY MOUTH DAILY AS NEEDED FOR ALERTNESS 08/12/24  Yes Young, Reggy D, MD  Multiple Vitamins-Minerals (OCUVITE ADULT 50+ PO) Take 1 tablet by mouth daily.   Yes [provider]  oxyCODONE -acetaminophen  (PERCOCET) 10-325 MG tablet Take 1 tablet by mouth 4 (four)  times daily as needed for pain. 06/30/24  Yes [provider]  polyethylene glycol powder (GLYCOLAX /MIRALAX ) 17 GM/SCOOP powder Take 17 g by mouth daily as needed for moderate constipation. 10/03/13  Yes [provider]  TURMERIC CURCUMIN PO Take 1 capsule by mouth in the morning and at bedtime.   Yes [provider]  Alpha-Lipoic Acid 200 MG CAPS Take 200 mg by mouth daily.    [provider]  hyoscyamine  (LEVSIN AMIEL) 0.125 MG SL tablet Place 1 tablet (0.125 mg total) under the tongue every 6 (six) hours as needed. 12/24/23   Perri Ronal PARAS, MD  meclizine  (ANTIVERT ) 25 MG tablet Take 1 tablet (25 mg total) by mouth 3 (three) times daily as needed for dizziness. 11/05/17   Perri Ronal PARAS, MD     Review of Systems  Positive ROS: As above  All other systems have been reviewed and were otherwise negative with the exception of those mentioned in the HPI and as above.  Objective: Vital signs in last 24 hours: Temp:  [98.2 F (36.8 C)] 98.2 F (36.8 C) (12/18 0601) Pulse Rate:  [69] 69 (12/18 0601) Resp:  [17] 17 (12/18 0601) BP: (153)/(84) 153/84 (12/18 0601) SpO2:  [98 %] 98 % (12/18 0601) Weight:  [88.5 kg] 88.5 kg (12/18 0601) Estimated body mass index is 32.45 kg/m as calculated from the following:   Height as of this encounter: 5' 5 (1.651 m).   Weight as of this encounter: 88.5 kg.   General Appearance: Alert Head: Normocephalic, without obvious abnormality, atraumatic Eyes: PERRL, conjunctiva/corneas clear, EOM's intact,    Ears: Normal  Throat: Normal  Neck: She has limited range of motion.  Her anterior cervical incision is well-healed. Back: unremarkable Lungs: Clear to auscultation bilaterally, respirations unlabored Heart: Regular rate and rhythm, no murmur, rub or gallop Abdomen: Soft, non-tender Extremities: She has limited range of motion of her left shoulder.  Skin: unremarkable  NEUROLOGIC:   Mental status: alert and  oriented,Motor Exam - grossly normal Sensory Exam - grossly normal Reflexes:  Coordination - grossly normal Gait - grossly normal Balance - grossly normal Cranial Nerves: I: smell Not tested  II: visual acuity  OS: Normal  OD: Normal   II: visual fields Full to confrontation  II: pupils Equal, round, reactive to light  III,VII: ptosis None  III,IV,VI: extraocular muscles  Full ROM  V: mastication Normal  V: facial light touch sensation  Normal  V,VII: corneal reflex  Present  VII: facial muscle function - upper  Normal  VII: facial muscle function - lower Normal  VIII: hearing Not tested  IX: soft  palate elevation  Normal  IX,X: gag reflex Present  XI: trapezius strength  5/5  XI: sternocleidomastoid strength 5/5  XI: neck flexion strength  5/5  XII: tongue strength  Normal    Data Review Lab Results  Component Value Date   WBC 8.5 08/12/2024   HGB 14.3 08/12/2024   HCT 44.2 08/12/2024   MCV 96.1 08/12/2024   PLT 239 08/12/2024   Lab Results  Component Value Date   NA 141 12/20/2023   K 4.6 12/20/2023   CL 104 12/20/2023   CO2 27 12/20/2023   BUN 20 12/20/2023   CREATININE 0.62 12/20/2023   GLUCOSE 89 12/20/2023   No results found for: INR, PROTIME  Assessment/Plan: Lumbar spinal stenosis, lumbar facet arthropathy, lumbar radiculopathy, neurogenic claudication, lumbago: I discussed situation with the patient and her husband.  I reviewed her imaging studies with them and pointed out the abnormalities.  We have discussed the various treatment options including surgery.  I described the surgical treatment option of an L4-5 decompression, instrumentation and fusion.  I have shown her surgical models.  I have given her surgical pamphlet.  We have discussed the risk, benefits, alternatives, expected postop course, and likelihood of achieving her goals with surgery.  I have answered all her questions.  She has decided proceed with surgery.   Reyes JONETTA Budge 08/14/2024 7:29 AM         [1]  Allergies Allergen Reactions   Divalproex Sodium Other (See Comments)    Severe diarrhea & extreme hair loss   Meloxicam     Hair loss    Quinolones Other (See Comments)    Ascending thoracic aortic aneurysm    Valproic Acid     Hair loss

## 2024-08-14 NOTE — Anesthesia Procedure Notes (Signed)
 Procedure Name: Intubation Date/Time: 08/14/2024 7:47 AM  Performed by: Atanacio Arland HERO, CRNAPre-anesthesia Checklist: Patient identified, Emergency Drugs available, Suction available and Patient being monitored Patient Re-evaluated:Patient Re-evaluated prior to induction Oxygen Delivery Method: Circle System Utilized Preoxygenation: Pre-oxygenation with 100% oxygen Induction Type: IV induction Ventilation: Mask ventilation without difficulty Laryngoscope Size: Glidescope and 3 Grade View: Grade I Tube type: Oral Tube size: 7.0 mm Number of attempts: 2 Airway Equipment and Method: Stylet and Rigid stylet Placement Confirmation: ETT inserted through vocal cords under direct vision, positive ETCO2 and breath sounds checked- equal and bilateral Secured at: 22 cm Tube secured with: Tape Dental Injury: Teeth and Oropharynx as per pre-operative assessment

## 2024-08-14 NOTE — Anesthesia Preprocedure Evaluation (Signed)
 Anesthesia Evaluation  Patient identified by MRN, date of birth, ID band Patient awake    Reviewed: Allergy & Precautions, NPO status , Patient's Chart, lab work & pertinent test results  Airway        Dental   Pulmonary former smoker          Cardiovascular negative cardio ROS      Neuro/Psych    GI/Hepatic   Endo/Other    Renal/GU      Musculoskeletal   Abdominal   Peds  Hematology   Anesthesia Other Findings   Reproductive/Obstetrics                              Anesthesia Physical Anesthesia Plan  ASA: 3  Anesthesia Plan: General   Post-op Pain Management: Tylenol  PO (pre-op)* and Ketamine  IV*   Induction: Intravenous  PONV Risk Score and Plan: 3 and Ondansetron , Dexamethasone  and Treatment may vary due to age or medical condition  Airway Management Planned: Mask and Oral ETT  Additional Equipment: None  Intra-op Plan:   Post-operative Plan: Extubation in OR  Informed Consent: I have reviewed the patients History and Physical, chart, labs and discussed the procedure including the risks, benefits and alternatives for the proposed anesthesia with the patient or authorized representative who has indicated his/her understanding and acceptance.     Dental advisory given  Plan Discussed with: CRNA  Anesthesia Plan Comments:         Anesthesia Quick Evaluation

## 2024-08-14 NOTE — Op Note (Signed)
 Brief history: The patient is a 73 year old white female who is complaining of back and bilateral leg pain consistent with neurogenic claudication.  She has failed medical management and was worked up with lumbar x-rays and lumbar MRI which demonstrated a lumbar spondylolisthesis, spinal stenosis, excetra.  I discussed the various treatment options with her.  She has decided proceed with surgery.  Preoperative diagnosis: Lumbar spondylolisthesis, lumbar facet arthropathy, lumbar degenerative disc disease, spinal stenosis compressing both the L4 and the L5 nerve roots; lumbago; lumbar radiculopathy; neurogenic claudication  Postoperative diagnosis: The same  Procedure: Bilateral L4-5 laminotomy/foraminotomies/medial facetectomy to decompress the bilateral L4 and L5 nerve roots(the work required to do this was in addition to the work required to do the posterior lumbar interbody fusion because of the patient's spinal stenosis, facet arthropathy. Etc. requiring a wide decompression of the nerve roots.); right L4-5 transforaminal lumbar interbody fusion with local morselized autograft bone and Zimmer DBM; insertion of interbody prosthesis at L4-5 (globus peek expandable interbody prosthesis); posterior nonsegmental instrumentation from L4 to L5 with globus titanium pedicle screws and rods; posterior lateral arthrodesis at L4-5 with local morselized autograft bone and Zimmer DBM.  Surgeon: Dr. Chyrl Budge  Asst.: Dr. Dorn Glade and Duwaine Beck, NP  Anesthesia: Gen. endotracheal  Estimated blood loss: 250 cc  Drains: Medium Hemovac drain in the epidural space  Complications: None  Description of procedure: The patient was brought to the operating room by the anesthesia team. General endotracheal anesthesia was induced. The patient was turned to the prone position on the Wilson frame. The patient's lumbosacral region was then prepared with Betadine scrub and Betadine solution. Sterile drapes were  applied.  I then injected the area to be incised with Marcaine  with epinephrine  solution. I then used the scalpel to make a linear midline incision over the L4-5 interspace. I then used electrocautery to perform a bilateral subperiosteal dissection exposing the spinous process and lamina of L4-5. We then obtained intraoperative radiograph to confirm our location. We then inserted the Verstrac retractor to provide exposure.  I began the decompression by using the high speed drill to perform laminotomies at L4-5 bilaterally. We then used the Kerrison punches to widen the laminotomy and removed the ligamentum flavum at L4-5 bilaterally. We used the Kerrison punches to remove the medial facets at L4-5 bilaterally, we removed the right L4-5 facet.. We performed wide foraminotomies about the bilateral L4 and L5 nerve roots completing the decompression.  We encountered a tiny pinhole in the dorsal thecal sac at L4-5 with some protruding arachnoid, likely from a prior injection.  I repaired it with a single 6-0 Prolene suture.  We now turned our attention to the posterior lumbar interbody fusion. I used a scalpel to incise the intervertebral disc at L4-5 bilaterally. I then performed a partial intervertebral discectomy at L4-5 bilaterally using the pituitary forceps. We prepared the vertebral endplates at L4-5 bilaterally for the fusion by removing the soft tissues with the curettes. We then used the trial spacers to pick the appropriate sized interbody prosthesis. We prefilled his prosthesis with a combination of local morselized autograft bone that we obtained during the decompression as well as Zimmer DBM. We inserted the prefilled prosthesis into the interspace at L4-5, we then turned and expanded the prosthesis. There was a good snug fit of the prosthesis in the interspace. We then filled and the remainder of the intervertebral disc space with local morselized autograft bone and Zimmer DBM. This completed the  posterior lumbar interbody arthrodesis.  During the decompression and insertion of the prosthesis the assistant protected the thecal sac and nerve roots with the D'Errico retractor.  We now turned attention to the instrumentation. Under fluoroscopic guidance we cannulated the bilateral L4 and L5 pedicles with the bone probe. We then removed the bone probe. We then tapped the pedicle with a 6.5 millimeter tap. We then removed the tap. We probed inside the tapped pedicle with a ball probe to rule out cortical breaches. We then inserted a 7.5 x 50 and 55 millimeter pedicle screw into the L4 and L5 pedicles bilaterally under fluoroscopic guidance. We then palpated along the medial aspect of the pedicles to rule out cortical breaches. There were none. The nerve roots were not injured. We then connected the unilateral pedicle screws with a lordotic rod. We compressed the construct and secured the rod in place with the caps. We then tightened the caps appropriately. This completed the instrumentation from L4-5.  We now turned our attention to the posterior lateral arthrodesis at L4-5 . We used the high-speed drill to decorticate the remainder of the facets, pars, transverse process at L4-5. We then applied a combination of local morselized autograft bone and Zimmer DBM over these decorticated posterior lateral structures. This completed the posterior lateral arthrodesis.  We then obtained hemostasis using bipolar electrocautery. We irrigated the wound out with vashe solution. We inspected the thecal sac and nerve roots and noted they were well decompressed. We then removed the retractor.  We injected Exparel  .  We placed a medium Hemovac drain in the epidural space and tunneled it out through a separate stab wound.  We reapproximated patient's thoracolumbar fascia with interrupted #1 Vicryl suture. We reapproximated patient's subcutaneous tissue with interrupted 2-0 Vicryl suture. The reapproximated patient's skin  with Steri-Strips and benzoin. The wound was then coated with bacitracin  ointment. A sterile dressing was applied. The drapes were removed. The patient was subsequently returned to the supine position where they were extubated by the anesthesia team. He was then transported to the post anesthesia care unit in stable condition. All sponge instrument and needle counts were reportedly correct at the end of this case.

## 2024-08-14 NOTE — Plan of Care (Signed)
°  Problem: Pain Managment: Goal: General experience of comfort will improve and/or be controlled Outcome: Progressing   Problem: Safety: Goal: Ability to remain free from injury will improve Outcome: Progressing   Problem: Skin Integrity: Goal: Risk for impaired skin integrity will decrease Outcome: Progressing   Problem: Education: Goal: Ability to verbalize activity precautions or restrictions will improve Outcome: Progressing Goal: Knowledge of the prescribed therapeutic regimen will improve Outcome: Progressing Goal: Understanding of discharge needs will improve Outcome: Progressing   Problem: Activity: Goal: Ability to avoid complications of mobility impairment will improve Outcome: Progressing   Problem: Skin Integrity: Goal: Will show signs of wound healing Outcome: Progressing

## 2024-08-14 NOTE — Transfer of Care (Signed)
 Immediate Anesthesia Transfer of Care Note  Patient: Kari Flynn  Procedure(s) Performed: POSTERIOR LUMBAR INTERBODY FUSION, INTERBODY PROSTHESIS, POSTERIOR INSTRUMENTATION LUMBAR FOUR- FIVE  Patient Location: PACU  Anesthesia Type:General  Level of Consciousness: patient cooperative and responds to stimulation  Airway & Oxygen Therapy: Patient Spontanous Breathing  Post-op Assessment: Report given to RN and Post -op Vital signs reviewed and stable  Post vital signs: Reviewed and stable  Last Vitals:  Vitals Value Taken Time  BP 130/65 08/14/24 12:37  Temp    Pulse 82 08/14/24 12:41  Resp 22 08/14/24 12:41  SpO2 93 % 08/14/24 12:41  Vitals shown include unfiled device data.  Last Pain:  Vitals:   08/14/24 0623  TempSrc:   PainSc: 6       Patients Stated Pain Goal: 1 (08/14/24 9376)  Complications: No notable events documented.

## 2024-08-15 ENCOUNTER — Telehealth: Payer: Self-pay

## 2024-08-15 DIAGNOSIS — M4316 Spondylolisthesis, lumbar region: Secondary | ICD-10-CM | POA: Diagnosis not present

## 2024-08-15 MED ORDER — ONDANSETRON 4 MG PO TBDP: 4.0000 mg | ORAL_TABLET | Freq: Three times a day (TID) | ORAL | 0 refills | Status: AC | PRN

## 2024-08-15 MED ORDER — POLYETHYLENE GLYCOL 3350 17 GM/SCOOP PO POWD: 17.0000 g | Freq: Every day | ORAL | 0 refills | Status: AC | PRN

## 2024-08-15 MED ORDER — OXYCODONE-ACETAMINOPHEN 10-325 MG PO TABS: 1.0000 | ORAL_TABLET | ORAL | 0 refills | Status: AC | PRN

## 2024-08-15 MED ORDER — CYCLOBENZAPRINE HCL 5 MG PO TABS: 5.0000 mg | ORAL_TABLET | Freq: Three times a day (TID) | ORAL | 0 refills | Status: AC | PRN

## 2024-08-15 MED FILL — Heparin Sodium (Porcine) Inj 1000 Unit/ML: INTRAMUSCULAR | Qty: 30 | Status: AC

## 2024-08-15 MED FILL — Thrombin For Soln 5000 Unit: CUTANEOUS | Qty: 5000 | Status: AC

## 2024-08-15 MED FILL — Sodium Chloride IV Soln 0.9%: INTRAVENOUS | Qty: 1000 | Status: AC

## 2024-08-15 NOTE — Discharge Summary (Signed)
 Physician Discharge Summary     Providing Compassionate, Quality Care - Together   Patient ID: Kari Flynn MRN: [id number] DOB/AGE: 1951-02-17 73 y.o.  Admit date: 08/14/2024 Discharge date: 08/15/2024  Admission Diagnoses: Spondylolisthesis of lumbar region  Discharge Diagnoses:  Principal Problem:   Spondylolisthesis of lumbar region   Discharged Condition: good  Hospital Course: Patient underwent an L4-5 PLIF by Dr. Mavis on 08/14/2024. She was admitted to 3C03 following recovery from anesthesia in the PACU. Her postoperative course has been uncomplicated. She has worked with both physical therapy who feels the patient is ready for discharge home. She is ambulating independently and without difficulty. She is tolerating a normal diet. She is not having any bowel or bladder dysfunction. Her pain is well-controlled with oral pain medication. She is ready for discharge home.   Consults: PT/TOC  Significant Diagnostic Studies: radiology: DG Lumbar Spine 2-3 Views Result Date: 08/14/2024 CLINICAL DATA:  Elective surgery. EXAM: LUMBAR SPINE - 2-3 VIEW COMPARISON:  Preoperative imaging FINDINGS: Two fluoroscopic spot views of the lumbar spine submitted from the operating room. Posterior rod and pedicle screw fixation with interbody spacer at L4-L5. Fluoroscopy time 30 seconds. Dose 29.1 mGy. IMPRESSION: Intraoperative fluoroscopy during lumbar fusion. Electronically Signed   By: Andrea Gasman M.D.   On: 08/14/2024 13:52   DG Lumbar Spine 1 View Result Date: 08/14/2024 CLINICAL DATA:  Elective surgery. EXAM: LUMBAR SPINE - 1 VIEW COMPARISON:  Preoperative imaging FINDINGS: Two portable cross-table lateral views of the lumbar spine submitted from the operating room. Image 1 demonstrates surgical instrument posteriorly at the L3 level. Image 2 demonstrates surgical instruments posteriorly at the L4-L5 level. IMPRESSION: Intraoperative localization during lumbar surgery.  Electronically Signed   By: Andrea Gasman M.D.   On: 08/14/2024 13:51   DG C-Arm 1-60 Min-No Report Result Date: 08/14/2024 Fluoroscopy was utilized by the requesting physician.  No radiographic interpretation.   DG C-Arm 1-60 Min-No Report Result Date: 08/14/2024 Fluoroscopy was utilized by the requesting physician.  No radiographic interpretation.     Treatments: surgery: Bilateral L4-5 laminotomy/foraminotomies/medial facetectomy to decompress the bilateral L4 and L5 nerve roots(the work required to do this was in addition to the work required to do the posterior lumbar interbody fusion because of the patient's spinal stenosis, facet arthropathy. Etc. requiring a wide decompression of the nerve roots.); right L4-5 transforaminal lumbar interbody fusion with local morselized autograft bone and Zimmer DBM; insertion of interbody prosthesis at L4-5 (globus peek expandable interbody prosthesis); posterior nonsegmental instrumentation from L4 to L5 with globus titanium pedicle screws and rods; posterior lateral arthrodesis at L4-5 with local morselized autograft bone and Zimmer DBM.  Discharge Exam: Blood pressure (!) 148/57, pulse 82, temperature 98.2 F (36.8 C), temperature source Oral, resp. rate 16, height 5' 5 (1.651 m), weight 88.5 kg, SpO2 97%.  Alert and oriented x 4 PERRLA CN II-XII grossly intact MAE, Strength and sensation intact Incision is covered with Honeycomb dressing and Steri Strips; Dressing with small amount of dried sanguinous drainage   Disposition: Discharge disposition: 01-Home or Self Care       Discharge Instructions     Call MD for:  difficulty breathing, headache or visual disturbances   Complete by: As directed    Call MD for:  hives   Complete by: As directed    Call MD for:  persistant nausea and vomiting   Complete by: As directed    Call MD for:  redness, tenderness, or signs of infection (pain, swelling,  redness, odor or green/yellow  discharge around incision site)   Complete by: As directed    Call MD for:  severe uncontrolled pain   Complete by: As directed    If the dressing is still on your incision site when you go home, remove it on the third day after your surgery date. Remove dressing if it begins to fall off, or if it is dirty or damaged before the third day.   Complete by: As directed    Increase activity slowly   Complete by: As directed       Allergies as of 08/15/2024       Reactions   Divalproex Sodium Other (See Comments)   Severe diarrhea & extreme hair loss   Meloxicam    Hair loss    Quinolones Other (See Comments)   Ascending thoracic aortic aneurysm    Valproic Acid    Hair loss        Medication List     TAKE these medications    acetaminophen  325 MG tablet Commonly known as: TYLENOL  Take 650 mg by mouth every 6 (six) hours as needed for moderate pain (pain score 4-6).   Alpha-Lipoic Acid 200 MG Caps Take 200 mg by mouth daily.   aspirin 81 MG tablet Take 81 mg by mouth daily.   B-COMPLEX PO Take 1 tablet by mouth daily.   BIOTIN PO Take 2 tablets by mouth daily.   buPROPion  300 MG 24 hr tablet Commonly known as: WELLBUTRIN  XL TAKE 1 TABLET BY MOUTH EVERY DAY   CALCIUM 1200 PO Take 1 tablet by mouth daily.   CeleBREX 100 MG capsule Generic drug: celecoxib Take 100 mg by mouth 2 (two) times daily.   cyclobenzaprine  5 MG tablet Commonly known as: FLEXERIL  Take 1 tablet (5 mg total) by mouth 3 (three) times daily as needed for muscle spasms.   Fioricet 50-300-40 MG Caps Generic drug: Butalbital-APAP-Caffeine Take 1 capsule by mouth daily as needed (migraine).   fluticasone  50 MCG/ACT nasal spray Commonly known as: FLONASE  Place 1 spray into both nostrils as needed for allergies or rhinitis.   gabapentin  300 MG capsule Commonly known as: NEURONTIN  Take 300 mg by mouth 3 (three) times daily.   hyoscyamine  0.125 MG SL tablet Commonly known as:  Levsin /SL Place 1 tablet (0.125 mg total) under the tongue every 6 (six) hours as needed.   meclizine  25 MG tablet Commonly known as: ANTIVERT  Take 1 tablet (25 mg total) by mouth 3 (three) times daily as needed for dizziness.   methadone  5 MG tablet Commonly known as: DOLOPHINE  Take 5 mg by mouth every 8 (eight) hours. What changed: when to take this   modafinil  200 MG tablet Commonly known as: PROVIGIL  TAKE ONE TABLET BY MOUTH DAILY AS NEEDED FOR ALERTNESS   OCUVITE ADULT 50+ PO Take 1 tablet by mouth daily.   ondansetron  4 MG disintegrating tablet Commonly known as: ZOFRAN -ODT Take 1 tablet (4 mg total) by mouth every 8 (eight) hours as needed for nausea or vomiting.   oxyCODONE -acetaminophen  10-325 MG tablet Commonly known as: PERCOCET Take 1 tablet by mouth every 4 (four) hours as needed (Postoperative pain). What changed:  when to take this reasons to take this   polyethylene glycol powder 17 GM/SCOOP powder Commonly known as: GLYCOLAX /MIRALAX  Take 17 g by mouth daily as needed for moderate constipation. Dissolve 1 capful (17g) in 4-8 ounces of liquid and take by mouth daily. What changed: additional instructions   TURMERIC CURCUMIN  PO Take 1 capsule by mouth in the morning and at bedtime.   Vitamin D  50 MCG (2000 UT) Caps Take 2,000 Units by mouth daily.               Discharge Care Instructions  (From admission, onward)           Start     Ordered   08/15/24 0000  If the dressing is still on your incision site when you go home, remove it on the third day after your surgery date. Remove dressing if it begins to fall off, or if it is dirty or damaged before the third day.        08/15/24 1342            Follow-up Information     Mavis Purchase, MD. Go on 09/09/2024.   Specialty: Neurosurgery Why: First post op appointment with x-rays is on 09/09/2024 at 2:00 PM. Contact information: [address] [place]  [address] [phone]                 Signed: Gerard Beck, DNP, AGNP-C Nurse Practitioner  [facility] [address], [address], [place], [place] [address] P: [phone]    F: [phone]  08/15/2024, 1:46 PM

## 2024-08-15 NOTE — Progress Notes (Signed)
 Subjective: The patient is alert and pleasant.  Her back is appropriately sore.  She has had some nausea.  Objective: Vital signs in last 24 hours: Temp:  [98.3 F (36.8 C)-99.5 F (37.5 C)] 98.9 F (37.2 C) (12/19 0546) Pulse Rate:  [67-91] 75 (12/19 0546) Resp:  [8-19] 16 (12/19 0546) BP: (106-131)/(46-80) 106/46 (12/19 0546) SpO2:  [92 %-99 %] 94 % (12/19 0546) Estimated body mass index is 32.45 kg/m as calculated from the following:   Height as of this encounter: 5' 5 (1.651 m).   Weight as of this encounter: 88.5 kg.   Intake/Output from previous day: 12/18 0701 - 12/19 0700 In: 2710 [P.O.:240; I.V.:2000; IV Piggyback:470] Out: 2700 [Urine:1795; Drains:580; Blood:325] Intake/Output this shift: No intake/output data recorded.  Physical exam the patient is alert and pleasant.  Her strength is normal.  Her dressing is clean and dry.  Lab Results: Recent Labs    08/12/24 1112  WBC 8.5  HGB 14.3  HCT 44.2  PLT 239   BMET No results for input(s): NA, K, CL, CO2, GLUCOSE, BUN, CREATININE, CALCIUM in the last 72 hours.  Studies/Results: DG Lumbar Spine 2-3 Views Result Date: 08/14/2024 CLINICAL DATA:  Elective surgery. EXAM: LUMBAR SPINE - 2-3 VIEW COMPARISON:  Preoperative imaging FINDINGS: Two fluoroscopic spot views of the lumbar spine submitted from the operating room. Posterior rod and pedicle screw fixation with interbody spacer at L4-L5. Fluoroscopy time 30 seconds. Dose 29.1 mGy. IMPRESSION: Intraoperative fluoroscopy during lumbar fusion. Electronically Signed   By: Andrea Gasman M.D.   On: 08/14/2024 13:52   DG Lumbar Spine 1 View Result Date: 08/14/2024 CLINICAL DATA:  Elective surgery. EXAM: LUMBAR SPINE - 1 VIEW COMPARISON:  Preoperative imaging FINDINGS: Two portable cross-table lateral views of the lumbar spine submitted from the operating room. Image 1 demonstrates surgical instrument posteriorly at the L3 level. Image 2 demonstrates  surgical instruments posteriorly at the L4-L5 level. IMPRESSION: Intraoperative localization during lumbar surgery. Electronically Signed   By: Andrea Gasman M.D.   On: 08/14/2024 13:51   DG C-Arm 1-60 Min-No Report Result Date: 08/14/2024 Fluoroscopy was utilized by the requesting physician.  No radiographic interpretation.   DG C-Arm 1-60 Min-No Report Result Date: 08/14/2024 Fluoroscopy was utilized by the requesting physician.  No radiographic interpretation.    Assessment/Plan: Postoperative day #1: The patient is doing well.  We we will mobilize her with PT.  She might go home later on today.  I gave her her discharge instructions and answered all her questions.  LOS: 0 days     Kari Flynn 08/15/2024, 7:36 AM     Patient ID: Kari Flynn, female   DOB: 1950/11/02, 73 y.o.   MRN: [id number]

## 2024-08-15 NOTE — Progress Notes (Signed)
 Patient alert and oriented, voided, ambulated. Surgical site clean and dry no sign of infection. D/c instructions explain and given to the patient and husband. All questions answered.

## 2024-08-15 NOTE — Telephone Encounter (Signed)
 Your request has been approved Request Reference Number: [id number]. MODAFINIL  TAB 200MG  is approved through 02/13/2025. Your patient may now fill this prescription and it will be covered. Authorization Expiration06/19/2026

## 2024-08-15 NOTE — Telephone Encounter (Signed)
*  Pulm  Pharmacy Patient Advocate Encounter   Received notification from Fax that prior authorization for Modafinil  200mg   is required/requested.   Insurance verification completed.   The patient is insured through [facility].   Per test claim: PA required; PA submitted to above mentioned insurance via Latent Key/confirmation #/EOC [id number] Status is pending

## 2024-08-15 NOTE — Care Management Obs Status (Signed)
 MEDICARE OBSERVATION STATUS NOTIFICATION   Patient Details  Name: Kari Flynn MRN: [id number] Date of Birth: 1950/12/09   Medicare Observation Status Notification Given:  Yes    Jennie Laneta Dragon 08/15/2024, 12:49 PM

## 2024-08-15 NOTE — Discharge Instructions (Signed)
Wound Care Keep incision covered and dry until post op day 3. You may remove the Honeycomb dressing on post op day 3. Leave steri-strips on back.  They will fall off by themselves. Do not put any creams, lotions, or ointments on incision. You are fine to shower. Let water run over incision and pat dry.  Activity Walk each and every day, increasing distance each day. No lifting greater than 5 lbs.  Avoid excessive back motion. No driving for 2 weeks; may ride as a passenger locally.  Diet Resume your normal diet.  Call Your Doctor If Any of These Occur Redness, drainage, or swelling at the wound.  Temperature greater than 101 degrees. Severe pain not relieved by pain medication. Incision starts to come apart.  Follow Up Appt Call [phone] today for appointment in 2-3 weeks if you don't already have one or for any problems.  If you have any hardware placed in your spine, you will need an x-ray before your appointment.  

## 2024-08-15 NOTE — Anesthesia Postprocedure Evaluation (Signed)
"   Anesthesia Post Note  Patient: Kari Flynn  Procedure(s) Performed: POSTERIOR LUMBAR INTERBODY FUSION, INTERBODY PROSTHESIS, POSTERIOR INSTRUMENTATION LUMBAR FOUR- FIVE     Patient location during evaluation: PACU Anesthesia Type: General Level of consciousness: awake and alert Pain management: pain level controlled Vital Signs Assessment: post-procedure vital signs reviewed and stable Respiratory status: spontaneous breathing, nonlabored ventilation, respiratory function stable and patient connected to nasal cannula oxygen Cardiovascular status: blood pressure returned to baseline and stable Postop Assessment: no apparent nausea or vomiting Anesthetic complications: no   No notable events documented.  Last Vitals:  Vitals:   08/15/24 0946 08/15/24 0950  BP: (!) 104/52 (!) 148/57  Pulse: 75 82  Resp:    Temp:    SpO2:      Last Pain:  Vitals:   08/15/24 1043  TempSrc:   PainSc: 5                  Erminio Nygard P Ahmaya Ostermiller      "

## 2024-08-15 NOTE — Evaluation (Signed)
 Physical Therapy Evaluation  Patient Details Name: Kari Flynn MRN: [id number] DOB: [birth year] Today's Date: 08/15/2024  History of Present Illness  Pt is a 73 y/o female who presents s/p L4-5 PLIF on 08/14/2024. PMH significant for Fibromyalgia, vertigo, ACDF 2024.  Clinical Impression  Pt admitted with above diagnosis. At the time of PT eval, pt was able to demonstrate transfers and ambulation with gross modified independence to CGA and RW for support. Pt was educated on precautions, brace application/wearing schedule, appropriate activity progression, and car transfer. Pt currently with functional limitations due to the deficits listed below (see PT Problem List). Pt will benefit from skilled PT to increase their independence and safety with mobility to allow discharge to the venue listed below.   Orthostatic BPs  Supine 106/52  Sitting 104/52  Standing 148/57            If plan is discharge home, recommend the following: A little help with walking and/or transfers;A little help with bathing/dressing/bathroom;Assistance with cooking/housework;Assist for transportation;Help with stairs or ramp for entrance   Can travel by private vehicle        Equipment Recommendations None recommended by PT  Recommendations for Other Services       Functional Status Assessment Patient has had a recent decline in their functional status and demonstrates the ability to make significant improvements in function in a reasonable and predictable amount of time.     Precautions / Restrictions Precautions Precautions: Fall;Back Precaution Booklet Issued: Yes (comment) Recall of Precautions/Restrictions: Intact Precaution/Restrictions Comments: Reviewed handout and pt was cued for precautions during functional mobility. Required Braces or Orthoses: Spinal Brace Spinal Brace: Lumbar corset;Applied in sitting position Restrictions Weight Bearing Restrictions Per Provider Order: No       Mobility  Bed Mobility Overal bed mobility: Needs Assistance Bed Mobility: Rolling, Sidelying to Sit Rolling: Modified independent (Device/Increase time) Sidelying to sit: Supervision       General bed mobility comments: Light cues for log roll technique. VC's for sequencing. HOB flat and rails lowered to simulate home environment.    Transfers Overall transfer level: Needs assistance Equipment used: Rolling walker (2 wheels) Transfers: Sit to/from Stand Sit to Stand: Supervision           General transfer comment: VC's for hand placement on seated surface for safety.    Ambulation/Gait Ambulation/Gait assistance: Contact guard assist Gait Distance (Feet): 500 Feet Assistive device: Rolling walker (2 wheels) Gait Pattern/deviations: Step-through pattern, Decreased stride length, Trunk flexed Gait velocity: Decreased Gait velocity interpretation: <1.31 ft/sec, indicative of household ambulator   General Gait Details: VC's for sequencing and general safety. No unsteadiness or LOB noted.  Stairs Stairs: Yes Stairs assistance: Contact guard assist Stair Management: One rail Left, Step to pattern, Forwards Number of Stairs: 3 General stair comments: VC's for sequencing and general safety. No assist required. Practice to prepare for pt negotiating stairs at home.  Wheelchair Mobility     Tilt Bed    Modified Rankin (Stroke Patients Only)       Balance Overall balance assessment: Mild deficits observed, not formally tested                                           Pertinent Vitals/Pain Pain Assessment Pain Assessment: Faces Faces Pain Scale: Hurts little more Pain Location: back, L shoulder from prior cervical surgery Pain Descriptors / Indicators: Operative  site guarding, Sore Pain Intervention(s): Limited activity within patient's tolerance, Monitored during session, Repositioned    Home Living Family/patient expects to be discharged  to:: Private residence Living Arrangements: Spouse/significant other Available Help at Discharge: Family;Available 24 hours/day Type of Home: House Home Access: Stairs to enter   Entrance Stairs-Number of Steps: 1 Alternate Level Stairs-Number of Steps: flight Home Layout: Two level;Able to live on main level with bedroom/bathroom Home Equipment: Rolling Walker (2 wheels);Shower seat      Prior Function Prior Level of Function : Independent/Modified Independent                     Extremity/Trunk Assessment   Upper Extremity Assessment Upper Extremity Assessment: LUE deficits/detail LUE Deficits / Details: Lingering pain in L shoulder and neck from prior cervical surgery.    Lower Extremity Assessment Lower Extremity Assessment: Generalized weakness    Cervical / Trunk Assessment Cervical / Trunk Assessment: Back Surgery  Communication   Communication Communication: No apparent difficulties    Cognition Arousal: Alert Behavior During Therapy: WFL for tasks assessed/performed   PT - Cognitive impairments: No apparent impairments                         Following commands: Intact       Cueing Cueing Techniques: Verbal cues, Gestural cues     General Comments      Exercises     Assessment/Plan    PT Assessment Patient needs continued PT services  PT Problem List Decreased strength;Decreased activity tolerance;Decreased balance;Decreased mobility;Decreased knowledge of use of DME;Decreased safety awareness;Decreased knowledge of precautions;Pain       PT Treatment Interventions DME instruction;Gait training;Stair training;Functional mobility training;Therapeutic activities;Therapeutic exercise;Balance training;Patient/family education    PT Goals (Current goals can be found in the Care Plan section)  Acute Rehab PT Goals Patient Stated Goal: Home at d/c PT Goal Formulation: With patient/family Time For Goal Achievement: 08/22/24 Potential  to Achieve Goals: Good    Frequency Min 5X/week     Co-evaluation               AM-PAC PT 6 Clicks Mobility  Outcome Measure Help needed turning from your back to your side while in a flat bed without using bedrails?: None Help needed moving from lying on your back to sitting on the side of a flat bed without using bedrails?: None Help needed moving to and from a bed to a chair (including a wheelchair)?: A Little Help needed standing up from a chair using your arms (e.g., wheelchair or bedside chair)?: A Little Help needed to walk in hospital room?: A Little Help needed climbing 3-5 steps with a railing? : A Little 6 Click Score: 20    End of Session Equipment Utilized During Treatment: Gait belt;Back brace Activity Tolerance: Patient tolerated treatment well Patient left: in chair;with call bell/phone within reach;with family/visitor present Nurse Communication: Mobility status PT Visit Diagnosis: Unsteadiness on feet (R26.81);Pain Pain - part of body:  (back)    Time: 9062-8981 PT Time Calculation (min) (ACUTE ONLY): 41 min   Charges:   PT Evaluation $PT Eval Low Complexity: 1 Low PT Treatments $Gait Training: 23-37 mins PT General Charges $$ ACUTE PT VISIT: 1 Visit         Kari Flynn, PT, DPT Acute Rehabilitation Services Secure Chat Preferred Office: [phone]   Kari Flynn 08/15/2024, 12:31 PM

## 2024-08-19 NOTE — Telephone Encounter (Signed)
 This encounter was created in error - please disregard.

## 2024-09-23 NOTE — Progress Notes (Unsigned)
 "  Office Visit Note  Patient: Kari Flynn             Date of Birth: 27-Jun-1951           MRN: [id number]             PCP: Perri Ronal PARAS, MD Referring: Perri Ronal PARAS, MD Visit Date: 09/24/2024 Occupation: Data Unavailable  Subjective:  Pain in both hips  History of Present Illness: Kari Flynn is a 74 y.o. female with osteoarthritis, degenerative disc disease and fibromyalgia syndrome.  She returns today after her last visit in August 2025.  She states on August 20, 2024 she had L4-L5 fusion by Dr. Mavis.  She states 12 days after the surgery she started having generalized pain and discomfort.  She believes it is fibromyalgia flare.  She discussed the situation with Dr. Mavis who placed her on oxycodone  and also increase gabapentin  to 600 mg 3 times daily.  She tried to taper oxycodone  but could not.  She is still taking oxycodone  once a day.  She was given a prednisone taper on September 02, 2024.  She noticed no improvement with prednisone taper.  She states she has been having difficulty sleeping on the side because of the hip pain.  She states she is unable to rest at night and has nocturnal pain.  He continues to have generalized muscle achiness.  He continues to have some cervical spinal stiffness.  He is recovering well from the lumbar spine surgery.  She has stiffness in her hands.  She denies discomfort in her knee joints.    Activities of Daily Living:  Patient reports morning stiffness for 3-4 hours.   Patient Reports nocturnal pain.  Difficulty dressing/grooming: Denies Difficulty climbing stairs: Reports Difficulty getting out of chair: Reports Difficulty using hands for taps, buttons, cutlery, and/or writing: Denies  Review of Systems  Constitutional:  Positive for fatigue.  HENT:  Negative for mouth sores and mouth dryness.   Eyes:  Positive for dryness.  Respiratory:  Positive for shortness of breath.   Cardiovascular:  Negative for chest pain and palpitations.   Gastrointestinal:  Negative for blood in stool, constipation and diarrhea.  Endocrine: Negative for increased urination.  Genitourinary:  Negative for involuntary urination.  Musculoskeletal:  Positive for joint pain, joint pain, myalgias, muscle weakness, morning stiffness, muscle tenderness and myalgias. Negative for gait problem and joint swelling.  Skin:  Negative for color change, rash, hair loss and sensitivity to sunlight.  Allergic/Immunologic: Negative for susceptible to infections.  Neurological:  Positive for headaches. Negative for dizziness.  Hematological:  Negative for swollen glands.  Psychiatric/Behavioral:  Negative for depressed mood and sleep disturbance. The patient is not nervous/anxious.     PMFS History:  Patient Active Problem List   Diagnosis Date Noted   Spondylolisthesis of lumbar region 08/14/2024   Primary osteoarthritis of both hands 05/18/2021   Cervical spondylosis without myelopathy 01/12/2016   Primary osteoarthritis of first carpometacarpal joint of left hand 01/12/2016   Hypersomnia with sleep apnea 08/14/2014   Osteoarthritis of both knees 03/22/2014   Constipation 10/16/2013   Insomnia 06/23/2010   Obstructive sleep apnea 06/25/2008   Migraine headache 06/25/2008   Fibromyalgia 06/25/2008   Shortness of breath 06/25/2008    Past Medical History:  Diagnosis Date   Arthritis    Dyspnea    Fibromyalgia    Lump of breast, left 11/08/2016   scheduled for a diagnostic/US  mammo bilateral and stereo, US  aspiration and  ductogram.    Migraine, unspecified, without mention of intractable migraine without mention of status migrainosus    Myalgia and myositis, unspecified    Obstructive sleep apnea (adult) (pediatric)    Vertigo     Family History  Problem Relation Age of Onset   Hypertension Mother    Arthritis Mother    Hyperlipidemia Mother    Melanoma Mother    Diverticulitis Mother    Multiple myeloma Father    Arthritis Other         sibling   Sarcoidosis Other        sibling-also has NIDDM   Diabetes Brother    Sarcoidosis Brother    Heart attack Brother    Chronic Renal Failure Brother    Diabetes Paternal Grandfather    Colon cancer Neg Hx    Esophageal cancer Neg Hx    Rectal cancer Neg Hx    Stomach cancer Neg Hx    Past Surgical History:  Procedure Laterality Date   ANTERIOR CERVICAL DECOMP/DISCECTOMY FUSION  04/16/2023   BLEPHAROPLASTY     BREAST BIOPSY     B9   BUNIONECTOMY     x2   CATARACT EXTRACTION, BILATERAL     01/2020, 02/2020   LUMBAR FUSION  08/14/2024   L4-L5   REFRACTIVE SURGERY Right 04/2022   laser eye surgery   WISDOM TOOTH EXTRACTION     Social History[1] Social History   Social History Narrative   Married. 2 adult sons. Non-smoker. Formerly worked as a orthoptist at [employer].      Immunization History  Administered Date(s) Administered   Fluad Quad(high Dose 65+) 06/19/2019, 06/07/2021   INFLUENZA, HIGH DOSE SEASONAL PF 07/13/2014, 07/06/2015, 05/31/2017, 07/05/2018, 07/29/2020   Influenza Split 06/09/2011, 08/01/2012, 05/28/2017   Influenza Whole 06/24/2009, 06/07/2010   Influenza,inj,Quad PF,6+ Mos 08/01/2013   Influenza-Unspecified 07/25/2014, 07/06/2015, 06/06/2016, 06/16/2022   PFIZER(Purple Top)SARS-COV-2 Vaccination 10/06/2019, 10/30/2019, 05/12/2020   PNEUMOCOCCAL CONJUGATE-20 12/22/2022   Pneumococcal Conjugate-13 11/05/2014   Pneumococcal Polysaccharide-23 11/05/2017   Tdap 06/09/2011, 12/16/2020   Zoster Recombinant(Shingrix) 09/16/2020     Objective: Vital Signs: BP (!) 148/88   Pulse 76   Temp 97.8 F (36.6 C)   Resp 14   Ht 5' 5 (1.651 m)   Wt 202 lb 3.2 oz (91.7 kg)   BMI 33.65 kg/m    Physical Exam Vitals and nursing note reviewed.  Constitutional:      Appearance: She is well-developed.  HENT:     Head: Normocephalic and atraumatic.  Eyes:     Conjunctiva/sclera: Conjunctivae normal.  Cardiovascular:     Rate and  Rhythm: Normal rate and regular rhythm.     Heart sounds: Normal heart sounds.  Pulmonary:     Effort: Pulmonary effort is normal.     Breath sounds: Normal breath sounds.  Abdominal:     General: Bowel sounds are normal.     Palpations: Abdomen is soft.  Musculoskeletal:     Cervical back: Normal range of motion.  Lymphadenopathy:     Cervical: No cervical adenopathy.  Skin:    General: Skin is warm and dry.     Capillary Refill: Capillary refill takes less than 2 seconds.  Neurological:     Mental Status: She is alert and oriented to person, place, and time.  Psychiatric:        Behavior: Behavior normal.      Musculoskeletal Exam: She had limited lateral rotation of the cervical spine.  She  had bilateral trapezius spasm.  Thoracic kyphosis without any tenderness was noted.  She had limited range of motion of her lumbar spine with discomfort.  Shoulders, elbows, wrist joints, MCPs PIPs and DIPs were in good range of motion.  Bilateral CMC, PIP and DIP thickening was noted.  Hip joints with good range of motion.  She had tenderness over bilateral trochanteric bursa.  Knee joints with good range of motion.  There was no tenderness over ankles or MTPs.  CDAI Exam: CDAI Score: -- Patient Global: --; Provider Global: -- Swollen: --; Tender: -- Joint Exam 09/24/2024   No joint exam has been documented for this visit   There is currently no information documented on the homunculus. Go to the Rheumatology activity and complete the homunculus joint exam.  Investigation: No additional findings.  Imaging: No results found.  Recent Labs: Lab Results  Component Value Date   WBC 8.5 08/12/2024   HGB 14.3 08/12/2024   PLT 239 08/12/2024   NA 141 12/20/2023   K 4.6 12/20/2023   CL 104 12/20/2023   CO2 27 12/20/2023   GLUCOSE 89 12/20/2023   BUN 20 12/20/2023   CREATININE 0.62 12/20/2023   BILITOT 0.5 12/20/2023   ALKPHOS 85 10/27/2016   AST 20 12/20/2023   ALT 24 12/20/2023    PROT 6.5 12/20/2023   ALBUMIN  4.1 10/27/2016   CALCIUM 9.5 12/20/2023   GFRAA 96 12/06/2020    Speciality Comments: No specialty comments available.  Procedures:  Large Joint Inj: bilateral greater trochanter on 09/24/2024 8:15 AM Indications: pain Details: 27 G 1.5 in needle, lateral approach  Arthrogram: No  Medications (Right): 1.5 mL lidocaine  1 %; 40 mg triamcinolone  acetonide 40 MG/ML Medications (Left): 1.5 mL lidocaine  1 %; 40 mg triamcinolone  acetonide 40 MG/ML Outcome: tolerated well, no immediate complications  Risk of infection, tendon injury, nerve injury, hypopigmentation and dermal atrophy were discussed. Procedure, treatment alternatives, risks and benefits explained, specific risks discussed. Consent was given by the patient. Immediately prior to procedure a time out was called to verify the correct patient, procedure, equipment, support staff and site/side marked as required. Patient was prepped and draped in the usual sterile fashion.     Allergies: Divalproex sodium, Meloxicam, Quinolones, and Valproic acid   Assessment / Plan:     Visit Diagnoses: Neck pain -patient continues to have some neck and stiffness.  She had limited range of motion.  Status post fusion August 2024  Trapezius muscle spasm-she continues to have spasm which is manageable.  DDD (degenerative disc disease), cervical - S/P ACDF, C5-C6 and C6-C7 fusion by Dr. Mavis April 16, 2023.  Fusion of lumbar spine - L4-L5 fusion August 21, 2023 by Dr. Mavis.  She states she recovered well from the lumbar spine fusion.  However she has been having generalized pain and fibromyalgia flare which is started soon after the surgery.  Primary osteoarthritis of both hands -she continues to have some stiffness.  No synovitis was noted.  X-ray showed bilateral second MCP joint narrowing.  Trochanteric bursitis of both hips -she has been having severe pain and discomfort over bilateral trochanteric  bursa.  She describes nocturnal pain and discomfort walking.  Patient states she was given a prednisone taper in the first week of January which did not help.  She requested bilateral trochanteric bursa injections.  Side effects were discussed at length.  After informed consent was obtained and side of legs were discussed bilateral trochanteric bursa were injected as described above.  She tolerated the procedure well.  Postprocedure instructions were given.  A handout on IT band stretches was given.  Plan: Large Joint Inj  Primary osteoarthritis of both knees -doing well without any warmth swelling or effusion.  Status post Euflexxa April 2024.  Good response to Visco supplement injections.  Fibromyalgia-she is having a flare of fibromyalgia with generalized pain and achiness.  She states her symptoms got worse after the surgery.  She has been having difficulty doing routine activities.  She states the dose of gabapentin  was increased by Dr. Mavis.  He is also had her on oxycodone  which only helps to some extent.  She plans to discuss this further with Dr. Orlando.  Benefits of water aerobics, swimming and stretching were discussed.  History of fatigue-she continues to have some fatigue.  History of insomnia-she has chronic insomnia.  She states insomnia is worse due to nocturnal pain.  Osteopenia of multiple sites - DEXA 09/01/21: The BMD measured at Forearm Radius 33% is 0.733 g/cm2 with a T-scoreof -1.6---ordered by Dr. Perri.  Calcium rich diet and vitamin D  was advised.  Elevated blood pressure reading-blood pressure was elevated at 160/84.  Repeat blood pressure was 140/88.  She was advised to monitor blood pressure closely and follow-up with her PCP.  Avoidance of salt was discussed.  Other medical problems are listed as follows:  Vitamin D  deficiency  History of migraine  History of depression  History of sleep apnea  Orders: Orders Placed This Encounter  Procedures   Large Joint  Inj   No orders of the defined types were placed in this encounter.    Follow-Up Instructions: Return in about 6 months (around 03/24/2025) for Osteoarthritis.   Maya Nash, MD  Note - This record has been created using Animal nutritionist.  Chart creation errors have been sought, but may not always  have been located. Such creation errors do not reflect on  the standard of medical care.     [1]  Social History Tobacco Use   Smoking status: Former    Current packs/day: 0.00    Average packs/day: 0.5 packs/day for 10.0 years (5.0 ttl pk-yrs)    Types: Cigarettes    Start date: 17    Quit date: 08/28/1977    Years since quitting: 47.1    Passive exposure: Never   Smokeless tobacco: Never  Vaping Use   Vaping status: Never Used  Substance Use Topics   Alcohol use: Yes    Comment: rarely   Drug use: Never   "

## 2024-09-24 ENCOUNTER — Encounter: Payer: Self-pay | Admitting: Rheumatology

## 2024-09-24 ENCOUNTER — Ambulatory Visit: Attending: Rheumatology | Admitting: Rheumatology

## 2024-09-24 VITALS — BP 148/88 | HR 76 | Temp 97.8°F | Resp 14 | Ht 65.0 in | Wt 202.2 lb

## 2024-09-24 DIAGNOSIS — M19042 Primary osteoarthritis, left hand: Secondary | ICD-10-CM | POA: Insufficient documentation

## 2024-09-24 DIAGNOSIS — M7061 Trochanteric bursitis, right hip: Secondary | ICD-10-CM | POA: Insufficient documentation

## 2024-09-24 DIAGNOSIS — R030 Elevated blood-pressure reading, without diagnosis of hypertension: Secondary | ICD-10-CM | POA: Insufficient documentation

## 2024-09-24 DIAGNOSIS — M4326 Fusion of spine, lumbar region: Secondary | ICD-10-CM | POA: Insufficient documentation

## 2024-09-24 DIAGNOSIS — Z8669 Personal history of other diseases of the nervous system and sense organs: Secondary | ICD-10-CM | POA: Insufficient documentation

## 2024-09-24 DIAGNOSIS — M5030 Other cervical disc degeneration, unspecified cervical region: Secondary | ICD-10-CM | POA: Insufficient documentation

## 2024-09-24 DIAGNOSIS — M542 Cervicalgia: Secondary | ICD-10-CM | POA: Insufficient documentation

## 2024-09-24 DIAGNOSIS — M797 Fibromyalgia: Secondary | ICD-10-CM | POA: Diagnosis not present

## 2024-09-24 DIAGNOSIS — M170 Bilateral primary osteoarthritis of knee: Secondary | ICD-10-CM | POA: Diagnosis not present

## 2024-09-24 DIAGNOSIS — M62838 Other muscle spasm: Secondary | ICD-10-CM | POA: Diagnosis not present

## 2024-09-24 DIAGNOSIS — M8589 Other specified disorders of bone density and structure, multiple sites: Secondary | ICD-10-CM | POA: Diagnosis not present

## 2024-09-24 DIAGNOSIS — Z8659 Personal history of other mental and behavioral disorders: Secondary | ICD-10-CM | POA: Insufficient documentation

## 2024-09-24 DIAGNOSIS — M19041 Primary osteoarthritis, right hand: Secondary | ICD-10-CM | POA: Diagnosis not present

## 2024-09-24 DIAGNOSIS — M7062 Trochanteric bursitis, left hip: Secondary | ICD-10-CM | POA: Diagnosis present

## 2024-09-24 DIAGNOSIS — E559 Vitamin D deficiency, unspecified: Secondary | ICD-10-CM | POA: Diagnosis not present

## 2024-09-24 DIAGNOSIS — Z87898 Personal history of other specified conditions: Secondary | ICD-10-CM | POA: Insufficient documentation

## 2024-09-24 MED ORDER — TRIAMCINOLONE ACETONIDE 40 MG/ML IJ SUSP
40.0000 mg | INTRAMUSCULAR | Status: AC | PRN
  Administered 2024-09-24: 40 mg via INTRA_ARTICULAR

## 2024-09-24 MED ORDER — LIDOCAINE HCL 1 % IJ SOLN
1.5000 mL | INTRAMUSCULAR | Status: AC | PRN
  Administered 2024-09-24: 1.5 mL

## 2024-09-24 NOTE — Patient Instructions (Signed)
 Iliotibial Band Syndrome Rehab Ask your health care provider which exercises are safe for you. Do exercises exactly as told by your provider and adjust them as told. It's normal to feel mild stretching, pulling, tightness, or discomfort as you do these exercises. Stop right away if you feel sudden pain or your pain gets a lot worse. Do not begin these exercises until told by your provider. Stretching and range-of-motion exercises These exercises warm up your muscles and joints. They also improve the movement and flexibility of your hip and pelvis. Quadriceps stretch, prone  Lie face down (prone) on a firm surface like a bed or padded floor. Bend your left / right knee. Reach back to hold your ankle or pant leg. If you can't reach your ankle or pant leg, use a belt looped around your foot and grab the belt instead. Gently pull your heel toward your butt. Your knee should not slide out to the side. You should feel a stretch in the front of your thigh and knee, also called the quadriceps. Hold this position for __________ seconds. Repeat __________ times. Complete this exercise __________ times a day. Iliotibial band stretch The iliotibial band is a strip of tissue that runs along the outside of your hip down to your knee. Lie on your side with your left / right leg on top. Bend both knees and grab your left / right ankle. Stretch out your bottom arm to help you balance. Slowly bring your top knee back so your thigh goes behind your back. Slowly lower your top leg toward the floor until you feel a gentle stretch on the outside of your left / right hip and thigh. If you don't feel a stretch and your knee won't go farther, place the heel of your other foot on top of your knee and pull your knee down toward the floor with your foot. Hold this position for __________ seconds. Repeat __________ times. Complete this exercise __________ times a day. Strengthening exercises These exercises build strength  and endurance in your hip and pelvis. Endurance means your muscles can keep working even when they're tired. Straight leg raises, side-lying This exercise strengthens the muscles that rotate the leg at the hip and move it away from your body. These muscles are called hip abductors. Lie on your side with your left / right leg on top. Lie so your head, shoulder, hip, and knee line up. You can bend your bottom knee to help you balance. Roll your hips slightly forward so they're stacked directly over each other. Your left / right knee should face forward. Tense the muscles in your outer thigh and hip. Lift your top leg 4-6 inches (10-15 cm) off the ground. Hold this position for __________ seconds. Slowly lower your leg back down to the starting position. Let your muscles fully relax before doing this exercise again. Repeat __________ times. Complete this exercise __________ times a day. Leg raises, prone This exercise strengthens the muscles that move the hips backward. These muscles are called hip extensors. Lie face down (prone) on your bed or a firm surface. You can put a pillow under your hips for comfort and to support your lower back. Bend your left / right knee so your foot points straight up toward the ceiling. Keep the other leg straight and behind you. Squeeze your butt muscles. Lift your left / right thigh off the firm surface. Do not let your back arch. Tense your thigh muscle as hard as you can without having  more knee pain. Hold this position for __________ seconds. Slowly lower your leg to the starting position. Allow your leg to relax all the way. Repeat __________ times. Complete this exercise __________ times a day. Hip hike  Stand sideways on a bottom step. Place your feet so that your left / right leg is on the step, and the other foot is hanging off the side. If you need support for balance, hold onto a railing or wall. Keep your knees straight and your abdomen square,  meaning your hips are level. Then, lift your left / right hip up toward the ceiling. Slowly let your leg that's hanging off the step lower towards the floor. Your foot should get closer to the ground. Do not lean or bend your knees during this movement. Repeat __________ times. Complete this exercise __________ times a day. This information is not intended to replace advice given to you by your health care provider. Make sure you discuss any questions you have with your health care provider. Document Revised: 10/27/2022 Document Reviewed: 10/27/2022 Elsevier Patient Education  2024 [employer].

## 2024-10-07 ENCOUNTER — Ambulatory Visit: Admitting: Rheumatology

## 2024-12-09 ENCOUNTER — Ambulatory Visit: Admitting: Internal Medicine

## 2024-12-10 ENCOUNTER — Encounter

## 2024-12-23 ENCOUNTER — Other Ambulatory Visit: Payer: Self-pay

## 2024-12-25 ENCOUNTER — Encounter: Payer: Self-pay | Admitting: Internal Medicine

## 2025-03-24 ENCOUNTER — Ambulatory Visit: Admitting: Rheumatology
